# Patient Record
Sex: Male | Born: 1950
Health system: Southern US, Community
[De-identification: ages and names within clinical notes are randomized; demographics above are authoritative.]

## PROBLEM LIST (undated history)

## (undated) DIAGNOSIS — R112 Nausea with vomiting, unspecified: Secondary | ICD-10-CM

## (undated) DIAGNOSIS — I10 Essential (primary) hypertension: Secondary | ICD-10-CM

## (undated) DIAGNOSIS — E785 Hyperlipidemia, unspecified: Secondary | ICD-10-CM

## (undated) DIAGNOSIS — R52 Pain, unspecified: Secondary | ICD-10-CM

## (undated) DIAGNOSIS — M1990 Unspecified osteoarthritis, unspecified site: Secondary | ICD-10-CM

## (undated) DIAGNOSIS — C801 Malignant (primary) neoplasm, unspecified: Secondary | ICD-10-CM

## (undated) DIAGNOSIS — F419 Anxiety disorder, unspecified: Secondary | ICD-10-CM

## (undated) DIAGNOSIS — K219 Gastro-esophageal reflux disease without esophagitis: Secondary | ICD-10-CM

## (undated) DIAGNOSIS — K5792 Diverticulitis of intestine, part unspecified, without perforation or abscess without bleeding: Secondary | ICD-10-CM

## (undated) DIAGNOSIS — F988 Other specified behavioral and emotional disorders with onset usually occurring in childhood and adolescence: Secondary | ICD-10-CM

## (undated) DIAGNOSIS — IMO0001 Reserved for inherently not codable concepts without codable children: Secondary | ICD-10-CM

## (undated) DIAGNOSIS — E119 Type 2 diabetes mellitus without complications: Secondary | ICD-10-CM

## (undated) DIAGNOSIS — Z9889 Other specified postprocedural states: Secondary | ICD-10-CM

## (undated) HISTORY — PX: OTHER SURGICAL HISTORY: SHX169

## (undated) HISTORY — DX: Unspecified osteoarthritis, unspecified site: M19.90

## (undated) HISTORY — DX: Anxiety disorder, unspecified: F41.9

## (undated) HISTORY — DX: Diverticulitis of intestine, part unspecified, without perforation or abscess without bleeding: K57.92

## (undated) HISTORY — DX: Hyperlipidemia, unspecified: E78.5

## (undated) HISTORY — PX: BACK SURGERY: SHX140

## (undated) HISTORY — PX: HERNIA REPAIR: SHX51

---

## 1997-08-07 ENCOUNTER — Ambulatory Visit (HOSPITAL_COMMUNITY): Admission: RE | Admit: 1997-08-07 | Discharge: 1997-08-07 | Payer: Self-pay | Admitting: *Deleted

## 2000-11-19 ENCOUNTER — Emergency Department (HOSPITAL_COMMUNITY): Admission: EM | Admit: 2000-11-19 | Discharge: 2000-11-19 | Payer: Self-pay | Admitting: Emergency Medicine

## 2000-11-19 ENCOUNTER — Encounter: Payer: Self-pay | Admitting: Emergency Medicine

## 2001-03-01 ENCOUNTER — Encounter: Admission: RE | Admit: 2001-03-01 | Discharge: 2001-03-30 | Payer: Self-pay | Admitting: Internal Medicine

## 2003-11-12 ENCOUNTER — Ambulatory Visit (HOSPITAL_COMMUNITY): Admission: RE | Admit: 2003-11-12 | Discharge: 2003-11-12 | Payer: Self-pay | Admitting: *Deleted

## 2005-10-12 ENCOUNTER — Encounter: Admission: RE | Admit: 2005-10-12 | Discharge: 2005-10-12 | Payer: Self-pay | Admitting: *Deleted

## 2005-11-18 ENCOUNTER — Encounter: Admission: RE | Admit: 2005-11-18 | Discharge: 2005-11-18 | Payer: Self-pay | Admitting: General Surgery

## 2005-12-22 ENCOUNTER — Inpatient Hospital Stay (HOSPITAL_COMMUNITY): Admission: RE | Admit: 2005-12-22 | Discharge: 2005-12-26 | Payer: Self-pay | Admitting: General Surgery

## 2005-12-22 ENCOUNTER — Encounter (INDEPENDENT_AMBULATORY_CARE_PROVIDER_SITE_OTHER): Payer: Self-pay | Admitting: Specialist

## 2006-01-10 HISTORY — PX: COLON SURGERY: SHX602

## 2007-10-17 ENCOUNTER — Encounter: Admission: RE | Admit: 2007-10-17 | Discharge: 2007-10-17 | Payer: Self-pay | Admitting: *Deleted

## 2007-12-19 ENCOUNTER — Ambulatory Visit (HOSPITAL_COMMUNITY): Admission: RE | Admit: 2007-12-19 | Discharge: 2007-12-20 | Payer: Self-pay | Admitting: Specialist

## 2007-12-19 ENCOUNTER — Encounter (INDEPENDENT_AMBULATORY_CARE_PROVIDER_SITE_OTHER): Payer: Self-pay | Admitting: Specialist

## 2008-02-29 ENCOUNTER — Ambulatory Visit: Payer: Self-pay | Admitting: Diagnostic Radiology

## 2008-02-29 ENCOUNTER — Emergency Department (HOSPITAL_BASED_OUTPATIENT_CLINIC_OR_DEPARTMENT_OTHER): Admission: EM | Admit: 2008-02-29 | Discharge: 2008-02-29 | Payer: Self-pay | Admitting: Emergency Medicine

## 2010-04-27 LAB — BASIC METABOLIC PANEL
BUN: 17 mg/dL (ref 6–23)
CO2: 27 mEq/L (ref 19–32)
Chloride: 106 mEq/L (ref 96–112)
GFR calc Af Amer: >60 mL/min (ref >60)
GFR calc non Af Amer: >60 mL/min (ref >60)
Potassium: 4.2 mEq/L (ref 3.5–5.1)

## 2010-04-27 LAB — D-DIMER, QUANTITATIVE: D-Dimer, Quant: 0.44 ug/mL-FEU (ref 0.00–0.48)

## 2010-04-27 LAB — POCT CARDIAC MARKERS
CKMB, poc: 2.9 ng/mL (ref 1.0–8.0)
Myoglobin, poc: 399 ng/mL (ref 12–200)
Troponin i, poc: <0.05 ng/mL (ref 0.00–0.09)
Troponin i, poc: <0.05 ng/mL (ref 0.00–0.09)

## 2010-05-25 NOTE — Op Note (Signed)
NAMESHANTI, EICHEL              ACCOUNT NO.:  [id number]   MEDICAL RECORD NO.:  [id number]          PATIENT TYPE:  AMB   LOCATION:  DAY                          FACILITY:  [facility]   PHYSICIAN:  Jene Every, M.D.    DATE OF BIRTH:  August 25, 1950   DATE OF PROCEDURE:  12/19/2007  DATE OF DISCHARGE:                               OPERATIVE REPORT   PREOPERATIVE DIAGNOSES:  Disk herniation, spinal stenosis at L4-5,  right; L4-5, left.   PROCEDURES PERFORMED:  1. Bilateral redo hemilaminotomy, lateral recess decompression,      microdiskectomy L4-5.  2. Bilateral hemilaminotomy, lateral recess decompression,      foraminotomy L5-S1, redo.   ANESTHESIA:  General.   ASSISTANT:  Roma Schanz, P.A.   BRIEF HISTORY/INDICATIONS:  The patient is a 60 year old male who has  had two surgical decompressions of L4-5,  and L5-S1 in the past.  He has  had a recurrent disk herniation on MRI with right and left-sided  symptoms, fragments noted on the right and on the left.  Free fragment  on the left disk space, on the right lateral recess stenosis.  We have  had apparent partial removal of the lamina of 4-5 and by MRI at least  decompression of 4-5 on the right bilaterally.  Indicated decompression  bilaterally of predominantly at 4-5 and possibly down at 5-1.  The  risks and benefits discussed including bleeding, infection, damage to  neurovascular structures, CSF leakage, epidural fibrosis, adjacent  segment disease and the need for fusion in future, anesthetic  complications, etc.   The operative case was extended in length due to the extensive fibrosis  noted and the presence of the bilateral decompression with epidural  fibrosis at two levels and an old free fragment that was encased in scar  tissue.   TECHNIQUE:  The patient in supine position, after administration of  adequate general anesthesia, gram of vanco and Kefzol, the lumbar region  was prepped and draped in the usual  sterile fashion.  An 18 gauge spinal  needle was utilized to localize the 4-5 process.  Incision was made from  spinous process of 3 down to below the 4-5 space.  Subcutaneous tissue  was dissected.  Electrocautery utilized to achieve hemostasis.  Dorsolumbar fascia identified and divided along the skin incision.  The  spinous process of 3 was identified.  Kocher placed and confirmed, it  was skeletonized at three.  Lamina of three bilaterally was identified  as was 4.  We then with a curette skeletonized the lateral borders of  the previous decompression at 4-5.  There was epidural fibrosis scar  tissue bilaterally.  We then found the residual lamina of 4 and of 5.  The operating microscope draped and brought onto the surgical field.  I  turned my attention first to the facet caudad edge of the residual  neural arch of 4 and used a straight curette and 2 mm Kerrison to  decompress above the ligamentum flavum fatty tissue.  And on the right  we decompressed the lateral border to the medial border of the pedicle.  There was a large bulbous free fragment that appeared to be old, was  sitting in the axilla of the nerve root.  The nerve was compressed  significantly in the lateral recess and into the foramen.  It took  extensive meticulous dissection to delineate the borders of the fragment  as it was encased in scar tissue from the thecal sac and the nerve root  of 5.  We decompressed the lateral border with a 2 mm Kerrison.  There  was significant stenosis noted there as well.  This was skeletonized  into the axilla and once it was determined that it was clearly a free  fragment.  We incised it and removed multiple free fragments from this  encasing and finally the large full free fragment that extended down to  the disk space across the midline with a micro pituitary removed the  fragment was central noted on the MRI.  There was a large cavity with  scar tissue encased the free fragment  suggesting that had been there for  a period of time.  Following this decompression the foraminotomy of 4  and 5 removing the residual lamina across the midline of 5 to expose the  foramen of 5 down at 5-1.  It was found that the 5 root and 4 root were  freely mobile and had at least a centimeter excursion medial to the  pedicle.  No disk herniation was noted at the disk space.  On the  contralateral side again we skeletonized the 4-5 previous decompression,  the residual lamina of 5, removed the residual lamina of 5,  decompressing the lateral recess, superior lateral recess stenosis  multifactorial on the right at the disk space, ligamentum flavum  hypertrophy and stenosis.  This was decompressed to the medial border of  the pedicle.  Both sides we preserved ample portion of the pars at L4.  Undercutting the lamina, superior articular of 5 was noted to be part of  this process.  This was skeletonized and removed with 2 mm Kerrison.  A  disk herniation was noted here that was obtained within the annulus.  A  copious portion of disk material was removed from the disk space with  micro pituitary, decompressing this lateral recess.  There was a small  bulbous area around thecal sac.  It was presumed old scar tissue and  disk fragment adhesed to the thecal sac not impinging but felt best to  leave as the risk of dural rent was high.  The disk posed no impingement  upon the root it was felt to be the best course of action at this point.  We placed a full foraminotomy of 5 extending the hockey-stick down into  the 5-1 space onto the lateral recess.  Good excursion of 5 in the 4  root at least a centimeter medial to the pedicle.  There was  hypervascularity and epidural venous plexus bilaterally that were  cauterized with bipolar electrocautery.  The disk space was copiously  irrigated bilaterally.  Hockey stick probe was then placed cephalad to  the pedicle of 3 and caudad to the foramen of 5  bilaterally.  There was  excellent decompression noted, no CSF leakage or active bleeding.  We  felt the thecal sac was pulsatile not constricted.  We debulked some the  epidural fibrosis on the dorsal aspect of the sac.   The disk space was copiously irrigated.  Thrombin-soaked Gelfoam was  placed in the laminotomy defect.  McCullough retractors  removed and  irrigated.  Paraspinous muscle inspected with no active bleeding.  The  muscle and fascia were reapproximated with 1 Vicryl interrupted figure-  of-eight sutures.  Subcutaneous tissue reapproximated with 2-0 Vicryl  sutures.  The skin was reapproximated with staples and wound was dressed  sterilely, placed supine on hospital bed, extubated without difficulty  and transported to recovery room in satisfactory condition.   The patient tolerated the procedure well.  Blood loss 100 mL.      Jene Every, M.D.  Electronically Signed     JB/MEDQ  D:  12/19/2007  T:  12/19/2007  Job:  [id number]

## 2010-05-28 NOTE — Op Note (Signed)
[name], [name]              ACCOUNT NO.:  [id number]   MEDICAL RECORD NO.:  [id number]          PATIENT TYPE:  AMB   LOCATION:  ENDO                         FACILITY:  [facility]   PHYSICIAN:  Georgiana Spinner, M.D.    DATE OF BIRTH:  [birth year]   DATE OF PROCEDURE:  11/12/2003  DATE OF DISCHARGE:                                 OPERATIVE REPORT   PROCEDURE:  Colonoscopy.   INDICATIONS:  Rectal bleeding, colon cancer screening.   ANESTHESIA:  Demerol 80 mg, Versed 8 mg.   DESCRIPTION OF PROCEDURE:  With patient mildly sedated in the left lateral  decubitus position, a rectal examination was performed which was  unremarkable.  Subsequently, the Olympus videoscopic colonoscope was  inserted in the rectum, passed through a diverticula-filled sigmoid colon to  the cecum which was identified by ileocecal valve and appendiceal orifice,  both of which were photographed.  From this point, the colonoscope was  slowly withdrawn, taking circumferential views of the colonic mucosa,  stopping in the rectum which appeared normal on direct and retroflexed view.  The endoscope was straightened, withdrawn.  The patient's vital signs and  pulse oximeter remained stable.  The patient tolerated the procedure well  without apparent complications.   FINDINGS:  1.  Diverticulosis of the sigmoid colon.  2.  Otherwise, unremarkable examination.   PLAN:  Repeat examination in 5-10 years.      GMO/MEDQ  D:  11/12/2003  T:  11/12/2003  Job:  [id number]

## 2010-05-28 NOTE — H&P (Signed)
[name], Jones              ACCOUNT NO.:  [id number]   MEDICAL RECORD NO.:  [id number]          PATIENT TYPE:  INP   LOCATION:  1621                         FACILITY:  [facility]   PHYSICIAN:  Adolph Pollack, M.D.DATE OF BIRTH:  [birth year]   DATE OF ADMISSION:  12/22/2005  DATE OF DISCHARGE:                              HISTORY & PHYSICAL   REASON FOR ADMISSION:  Elective sigmoid colectomy.   HISTORY OF PRESENT ILLNESS:  [name] is a 60 year old male who has  had recurrent sigmoid diverticular disease with a recurrent  diverticulitis.  It is for this reason, that he presents now for  elective sigmoid colectomy.  Barium enema demonstrates except extensive  sigmoid diverticular disease.   PAST MEDICAL HISTORY:  1. Hypertension.  2. Hearing loss right ear.  3. Chronic lower back pain.  4. Adult attention deficit disorder.   PREVIOUS OPERATIONS:  1. Lumbar spine surgery twice.  2. Left inguinal hernia repair.  3. LASIK corrective surgery for eyes.   ALLERGIES:  None.   HOME MEDICATIONS:  Adderall, Diovan, aspirin, vitamin B12, multivitamin,  oxycodone p.r.n., Flexeril p.r.n., and fish oil.   SOCIAL HISTORY:  Married.  No tobacco use; is a social drinker.   FAMILY HISTORY:  Notable for heart disease, hypertension, and  hypercholesterolemia.   PHYSICAL EXAM:  GENERAL:  A well-developed, well-nourished male in no  acute distress, pleasant and cooperative.  VITAL SIGNS: Temperature 97.1, blood pressure 142/96, pulse 72.  EYES:  Extraocular motions intact.  No icterus.  RESPIRATORY:  Breath sounds equal and clear.  Respirations are  unlabored.  CARDIOVASCULAR:  Heart demonstrates a regular rate, regular rhythm.  I  do not hear a murmur.  ABDOMEN:  Soft, nontender, nondistended.  Well-healed left groin scar is  present.  No palpable masses or organomegaly.  MUSCULOSKELETAL:  Extremities have SCDs on.   IMPRESSION:  Recurrent sigmoid diverticulitis.   PLAN:   Laparoscopic-assisted sigmoid colectomy.  We discussed this  procedure and the risks preoperatively.      Adolph Pollack, M.D.  Electronically Signed     TJR/MEDQ  D:  12/22/2005  T:  12/22/2005  Job:  [id number]

## 2010-05-28 NOTE — Discharge Summary (Signed)
NAMEAMR, STURTEVANT              ACCOUNT NO.:  [id number]   MEDICAL RECORD NO.:  [id number]          PATIENT TYPE:  INP   LOCATION:  1621                         FACILITY:  [facility]   PHYSICIAN:  Adolph Pollack, M.D.DATE OF BIRTH:  Apr 24, 1950   DATE OF ADMISSION:  12/22/2005  DATE OF DISCHARGE:  12/26/2005                               DISCHARGE SUMMARY   PRINCIPLE DISCHARGE DIAGNOSIS:  Recurrent sigmoid diverticulitis.   SECONDARY DIAGNOSES:  1. Mild postoperative ileus.  2. Hypertension.   PROCEDURE:  Laparoscopic-assisted sigmoid colectomy.   INDICATIONS:  This is a 60 year old male who has had recurrent sigmoid  diverticular disease with diverticulitis.  He was admitted for elective  resection.   HOSPITAL COURSE:  He underwent the laparoscopic-assisted sigmoid  colectomy.  Tolerated this well.  Postoperatively, he had a mild ileus  but had resumption of bowel function.  Thus, by his third postop day, he  had a small bowel movement.  By the fourth postop day, he was tolerating  a diet, passing gas, having BMs, and ready to be discharged.   DISPOSITION:  Discharged home in satisfactory condition on December 26, 2005.  His final pathology demonstrates diverticulosis with no evidence  of malignancy.  He was given activity instructions and will return to  the office in a few days to have his staples removed.      Adolph Pollack, M.D.  Electronically Signed     TJR/MEDQ  D:  01/20/2006  T:  01/20/2006  Job:  [id number]   cc:   Georgiana Spinner, M.D.  Fax: [phone]   Doris Cheadle. Foy Guadalajara, M.D.  Fax: [phone]

## 2010-05-28 NOTE — Op Note (Signed)
[name], [name]              ACCOUNT NO.:  [id number]   MEDICAL RECORD NO.:  [id number]          PATIENT TYPE:  INP   LOCATION:  1621                         FACILITY:  [facility]   PHYSICIAN:  Adolph Pollack, M.D.DATE OF BIRTH:  [birth year]   DATE OF PROCEDURE:  12/22/2005  DATE OF DISCHARGE:                               OPERATIVE REPORT   PREOP DIAGNOSIS:  Recurrent sigmoid diverticulitis.   POSTOP DIAGNOSIS:  Recurrent sigmoid diverticulitis.   PROCEDURE:  Laparoscopic-assisted sigmoid colectomy with mobilization of  splenic flexure.   SURGEON:  Adolph Pollack, M.D.   ASSISTANT:  Ardeth Sportsman, MD   ANESTHESIA:  General.   INDICATIONS:  This 60 year old male has had recurring bouts of sigmoid  diverticulitis.  He has significant diverticular disease on barium enema  and now presents for the above procedure.   TECHNIQUE:  He was seen in the holding area and brought to the operating  room, placed supine on the operating table, and general anesthetic was  administered.  The hair on the abdominal wall was clipped.  A Foley  catheter was inserted.  He was placed in the lithotomy position.  The  abdominal wall and perineal area were then sterilely prepped and draped.  A small supraumbilical incision was made through the skin, subcutaneous  tissue, fascia and peritoneum.  A pursestring suture of #0 Vicryl was  placed around the fascial edges.  A Hassan trocar was introduced into  the peritoneal cavity; and a pneumoperitoneum was created by  insufflation of CO2 gas.   Following this, the laparoscope was introduced.  A 5-mm trocar was  placed in the right lower quadrant, one in the suprapubic region, and  one in the left upper quadrant.  There were significant adhesions  between the omentum and sigmoid colon in the anterolateral abdominal  wall.  I mobilized these using the harmonic scalpel.  The colon was very  thick and abnormal-appearing in this area consistent with  recurrent  inflammatory disease.  I then mobilized the descending colon by dividing  the white line of Toldt; and the splenic flexure was mobilized utilizing  the harmonic scalpel allowing the transverse colon to drop distally.  I  then mobilized down to the rectosigmoid junction, although the adhesions  there were very dense.  I identified the left ureter and kept my plane  of dissection anterior to this.  At this point I had adequate  mobilization of the descending colon and sigmoid colon; and there was  some significant adhesions in the pelvis.  I decided to go ahead and  make my extraction site; and do further mobilization and extract the  colon.   I removed the supraumbilical 5 mm trocar and made a longitudinal limited  lower midline incision dividing the skin, and subcutaneous tissue,  fascia, and peritoneum.  Wound protection device was placed.  I grasped  the sigmoid colon; and was able to free up more adhesions and mobilize  the rectosigmoid junction.  The colon, at this segment, was quite  diseased with multiple diverticula and very firm; and looked to be  chronically inflamed.  I found the sigmoid colon and descending colon  junction which appeared to be soft and relatively normal.  Using the GIA  stapler.  I divided the colon here.  I then divided the mesentery using  a LigaSure and suture ligatures, down to the rectosigmoid junction.  I  divided the colon sharply here.  I handed  the specimen off the field to  be sent to pathology.  There was a small, little leakage of stool which  was contained.   Following this,  I performed an end-to-side anastomosis in a Baker-type  fashion using a single layer of interrupted 3-0 silk sutures.  I  inverted the suture line.  The anastomosis was patent, viable, and under  no tension.  Following this gloves were changed; and the abdominal  cavity irrigated and fluid evacuated.  I saw no evidence of bleeding.  I  then applied some Tisseel  to the anastomosis.  I then occluded the  descending colon proximal to the anastomosis.  Air was injected into the  rectum; and there was a small amount of fluid; and the anastomosis was  placed under this; and there was no evidence of leak.  The air was  released.   After this, needle, sponge and instrument counts were reported to be  correct.  I then closed the lower midline fascia with a running #1 PDS  suture.  I reinsufflated the abdomen and examined it; and evacuated some  irrigation fluid; then found a strong fascial closure and no active  bleeding.  I subsequently removed all trocars and released the  pneumoperitoneum.  The supraumbilical fascial defect was closed by  tightening up and tying down the pursestring suture.   Following this, the subcutaneous tissue was then irrigated.  The skin  closed with staples.  Sterile dressings were applied.   He tolerated the procedure well without apparent complications; and he  was taken to recovery in satisfactory condition.      Adolph Pollack, M.D.  Electronically Signed     TJR/MEDQ  D:  12/22/2005  T:  12/22/2005  Job:  [id number]   cc:   Molly Maduro L. Foy Guadalajara, M.D.  Fax: [phone]

## 2010-07-07 ENCOUNTER — Other Ambulatory Visit: Payer: Self-pay | Admitting: Dermatology

## 2010-10-08 ENCOUNTER — Other Ambulatory Visit: Payer: Self-pay | Admitting: Dermatology

## 2010-10-15 LAB — CBC
HCT: 48.4 % (ref 39.0–52.0)
Hemoglobin: 16.1 g/dL (ref 13.0–17.0)
RDW: 13.4 % (ref 11.5–15.5)

## 2010-10-15 LAB — COMPREHENSIVE METABOLIC PANEL
AST: 25 U/L (ref 0–37)
Albumin: 3.8 g/dL (ref 3.5–5.2)
Creatinine, Ser: 1.09 mg/dL (ref 0.4–1.5)

## 2010-10-15 LAB — URINALYSIS, ROUTINE W REFLEX MICROSCOPIC
Bilirubin Urine: NEGATIVE
Nitrite: NEGATIVE
Protein, ur: NEGATIVE mg/dL
Specific Gravity, Urine: 1.018 (ref 1.005–1.030)
pH: 5.5 (ref 5.0–8.0)

## 2011-05-09 ENCOUNTER — Encounter (HOSPITAL_COMMUNITY): Payer: Self-pay | Admitting: Pharmacy Technician

## 2011-05-10 ENCOUNTER — Encounter (HOSPITAL_COMMUNITY): Payer: Self-pay

## 2011-05-10 ENCOUNTER — Encounter (HOSPITAL_COMMUNITY)
Admission: RE | Admit: 2011-05-10 | Discharge: 2011-05-10 | Disposition: A | Discharge status: Home / Self Care | Payer: Worker's Compensation | Source: Ambulatory Visit | Attending: Specialist | Admitting: Specialist

## 2011-05-10 ENCOUNTER — Other Ambulatory Visit: Payer: Self-pay | Admitting: Otolaryngology

## 2011-05-10 ENCOUNTER — Other Ambulatory Visit: Payer: Self-pay

## 2011-05-10 ENCOUNTER — Ambulatory Visit (HOSPITAL_COMMUNITY)
Admission: RE | Admit: 2011-05-10 | Discharge: 2011-05-10 | Disposition: A | Discharge status: Home / Self Care | Payer: Worker's Compensation | Source: Ambulatory Visit | Attending: Specialist | Admitting: Specialist

## 2011-05-10 DIAGNOSIS — Z01810 Encounter for preprocedural cardiovascular examination: Secondary | ICD-10-CM | POA: Insufficient documentation

## 2011-05-10 DIAGNOSIS — Z01812 Encounter for preprocedural laboratory examination: Secondary | ICD-10-CM | POA: Insufficient documentation

## 2011-05-10 DIAGNOSIS — M24119 Other articular cartilage disorders, unspecified shoulder: Secondary | ICD-10-CM | POA: Insufficient documentation

## 2011-05-10 DIAGNOSIS — M25819 Other specified joint disorders, unspecified shoulder: Secondary | ICD-10-CM | POA: Insufficient documentation

## 2011-05-10 DIAGNOSIS — I10 Essential (primary) hypertension: Secondary | ICD-10-CM | POA: Insufficient documentation

## 2011-05-10 HISTORY — DX: Other specified behavioral and emotional disorders with onset usually occurring in childhood and adolescence: F98.8

## 2011-05-10 HISTORY — DX: Essential (primary) hypertension: I10

## 2011-05-10 HISTORY — DX: Reserved for inherently not codable concepts without codable children: IMO0001

## 2011-05-10 HISTORY — DX: Gastro-esophageal reflux disease without esophagitis: K21.9

## 2011-05-10 LAB — CBC
Hemoglobin: 15.1 g/dL (ref 13.0–17.0)
MCH: 31.5 pg (ref 26.0–34.0)
MCHC: 33.3 g/dL (ref 30.0–36.0)
RDW: 12.9 % (ref 11.5–15.5)

## 2011-05-10 LAB — BASIC METABOLIC PANEL
BUN: 15 mg/dL (ref 6–23)
Calcium: 9.5 mg/dL (ref 8.4–10.5)
GFR calc Af Amer: >90 mL/min (ref >90)
GFR calc non Af Amer: 90 mL/min — ABNORMAL LOW (ref >90)
Glucose, Bld: 100 mg/dL — ABNORMAL HIGH (ref 70–99)
Potassium: 4 mEq/L (ref 3.5–5.1)
Sodium: 142 mEq/L (ref 135–145)

## 2011-05-10 LAB — SURGICAL PCR SCREEN
MRSA, PCR: NEGATIVE
Staphylococcus aureus: NEGATIVE

## 2011-05-10 MED ORDER — CHLORHEXIDINE GLUCONATE 4 % EX LIQD: 60.0000 mL | Freq: Once | CUTANEOUS | Status: DC

## 2011-05-10 NOTE — Patient Instructions (Signed)
20 [name]  05/10/2011   Your procedure is scheduled on: 05/11/11  Report to SHORT STAY DEPT  at 8:00AM.  Call this number if you have problems the morning of surgery: [phone]   Remember:   Do not eat food or drink liquids AFTER MIDNIGHT    Take these medicines the morning of surgery with A SIP OF WATER: LIPITOR / PRISTIQ / HYDROCODONE IF NEEDED   Do not wear jewelry, make-up or nail polish.  Do not wear lotions, powders, or perfumes.   Do not shave legs or underarms 48 hrs. before surgery (men may shave face)  Do not bring valuables to the hospital.  Contacts, dentures or bridgework may not be worn into surgery.  Leave suitcase in the car. After surgery it may be brought to your room.  For patients admitted to the hospital, checkout time is 11:00 AM the day of discharge.   Patients discharged the day of surgery will not be allowed to drive home.    Special Instructions:   Please read over the following fact sheets that you were given: MRSA  Information / Incentive Spirometer               SHOWER WITH BETASEPT THE NIGHT BEFORE SURGERY AND THE MORNING OF SURGERY

## 2011-05-11 ENCOUNTER — Ambulatory Visit (HOSPITAL_COMMUNITY): Payer: Worker's Compensation | Admitting: *Deleted

## 2011-05-11 ENCOUNTER — Encounter (HOSPITAL_COMMUNITY): Payer: Self-pay

## 2011-05-11 ENCOUNTER — Encounter (HOSPITAL_COMMUNITY): Payer: Self-pay | Admitting: *Deleted

## 2011-05-11 ENCOUNTER — Ambulatory Visit (HOSPITAL_COMMUNITY)
Admission: RE | Admit: 2011-05-11 | Discharge: 2011-05-11 | Disposition: A | Discharge status: Home / Self Care | Payer: Worker's Compensation | Source: Ambulatory Visit | Attending: Specialist | Admitting: Specialist

## 2011-05-11 ENCOUNTER — Encounter (HOSPITAL_COMMUNITY)
Admission: RE | Disposition: A | Discharge status: Home / Self Care | Payer: Self-pay | Source: Ambulatory Visit | Attending: Specialist

## 2011-05-11 DIAGNOSIS — M25819 Other specified joint disorders, unspecified shoulder: Secondary | ICD-10-CM | POA: Insufficient documentation

## 2011-05-11 DIAGNOSIS — K219 Gastro-esophageal reflux disease without esophagitis: Secondary | ICD-10-CM | POA: Insufficient documentation

## 2011-05-11 DIAGNOSIS — Z79899 Other long term (current) drug therapy: Secondary | ICD-10-CM | POA: Insufficient documentation

## 2011-05-11 DIAGNOSIS — I10 Essential (primary) hypertension: Secondary | ICD-10-CM | POA: Insufficient documentation

## 2011-05-11 DIAGNOSIS — M24119 Other articular cartilage disorders, unspecified shoulder: Secondary | ICD-10-CM | POA: Insufficient documentation

## 2011-05-11 DIAGNOSIS — M7541 Impingement syndrome of right shoulder: Secondary | ICD-10-CM

## 2011-05-11 DIAGNOSIS — M9420 Chondromalacia, unspecified site: Secondary | ICD-10-CM | POA: Insufficient documentation

## 2011-05-11 SURGERY — SHOULDER ARTHROSCOPY WITH SUBACROMIAL DECOMPRESSION
Anesthesia: General | Site: Shoulder | Laterality: Right | Wound class: Clean

## 2011-05-11 MED ORDER — PROMETHAZINE HCL 25 MG/ML IJ SOLN: 6.2500 mg | INTRAMUSCULAR | Status: DC | PRN

## 2011-05-11 MED ORDER — ONDANSETRON HCL 4 MG/2ML IJ SOLN
INTRAMUSCULAR | Status: DC | PRN
  Administered 2011-05-11 (×2): 2 mg via INTRAVENOUS

## 2011-05-11 MED ORDER — METHOCARBAMOL 500 MG PO TABS: 500.0000 mg | ORAL_TABLET | Freq: Four times a day (QID) | ORAL | Status: AC

## 2011-05-11 MED ORDER — GLYCOPYRROLATE 0.2 MG/ML IJ SOLN
INTRAMUSCULAR | Status: DC | PRN
  Administered 2011-05-11: .4 mg via INTRAVENOUS

## 2011-05-11 MED ORDER — BUPIVACAINE-EPINEPHRINE 0.5% -1:200000 IJ SOLN
INTRAMUSCULAR | Status: AC
  Filled 2011-05-11: qty 1

## 2011-05-11 MED ORDER — LACTATED RINGERS IR SOLN
Status: DC | PRN
  Administered 2011-05-11: 3000 mL

## 2011-05-11 MED ORDER — EPINEPHRINE HCL 1 MG/ML IJ SOLN
INTRAMUSCULAR | Status: AC
  Filled 2011-05-11: qty 2

## 2011-05-11 MED ORDER — EPINEPHRINE HCL 1 MG/ML IJ SOLN
INTRAMUSCULAR | Status: DC | PRN
  Administered 2011-05-11: 1 mg

## 2011-05-11 MED ORDER — BUPIVACAINE-EPINEPHRINE 0.5% -1:200000 IJ SOLN
INTRAMUSCULAR | Status: DC | PRN
  Administered 2011-05-11: 7 mL

## 2011-05-11 MED ORDER — MIDAZOLAM HCL 5 MG/5ML IJ SOLN
INTRAMUSCULAR | Status: DC | PRN
  Administered 2011-05-11: 2 mg via INTRAVENOUS

## 2011-05-11 MED ORDER — NEOSTIGMINE METHYLSULFATE 1 MG/ML IJ SOLN
INTRAMUSCULAR | Status: DC | PRN
  Administered 2011-05-11: 3 mg via INTRAVENOUS

## 2011-05-11 MED ORDER — FENTANYL CITRATE 0.05 MG/ML IJ SOLN
25.0000 ug | INTRAMUSCULAR | Status: DC | PRN
Start: 2011-05-11 — End: 2011-05-11

## 2011-05-11 MED ORDER — SUCCINYLCHOLINE CHLORIDE 20 MG/ML IJ SOLN
INTRAMUSCULAR | Status: DC | PRN
  Administered 2011-05-11: 100 mg via INTRAVENOUS

## 2011-05-11 MED ORDER — PROPOFOL 10 MG/ML IV EMUL
INTRAVENOUS | Status: DC | PRN
  Administered 2011-05-11: 200 mg via INTRAVENOUS

## 2011-05-11 MED ORDER — FENTANYL CITRATE 0.05 MG/ML IJ SOLN
INTRAMUSCULAR | Status: DC | PRN
  Administered 2011-05-11 (×5): 50 ug via INTRAVENOUS

## 2011-05-11 MED ORDER — LIDOCAINE HCL (CARDIAC) 20 MG/ML IV SOLN
INTRAVENOUS | Status: DC | PRN
  Administered 2011-05-11: 20 mg via INTRAVENOUS

## 2011-05-11 MED ORDER — ACETAMINOPHEN 10 MG/ML IV SOLN
INTRAVENOUS | Status: DC | PRN
  Administered 2011-05-11: 1000 mg via INTRAVENOUS

## 2011-05-11 MED ORDER — CISATRACURIUM BESYLATE 2 MG/ML IV SOLN
INTRAVENOUS | Status: DC | PRN
  Administered 2011-05-11: 10 mg via INTRAVENOUS

## 2011-05-11 MED ORDER — DROPERIDOL 2.5 MG/ML IJ SOLN
INTRAMUSCULAR | Status: DC | PRN
  Administered 2011-05-11: .5 mg via INTRAVENOUS

## 2011-05-11 MED ORDER — CEFAZOLIN SODIUM 1-5 GM-% IV SOLN
1.0000 g | INTRAVENOUS | Status: AC
  Administered 2011-05-11: 1 g via INTRAVENOUS

## 2011-05-11 MED ORDER — LACTATED RINGERS IV SOLN
INTRAVENOUS | Status: DC | PRN
  Administered 2011-05-11 (×2): via INTRAVENOUS

## 2011-05-11 MED ORDER — CEFAZOLIN SODIUM 1-5 GM-% IV SOLN
INTRAVENOUS | Status: AC
  Filled 2011-05-11: qty 50

## 2011-05-11 MED ORDER — KETAMINE HCL 10 MG/ML IJ SOLN
INTRAMUSCULAR | Status: DC | PRN
  Administered 2011-05-11: 10 mg via INTRAVENOUS

## 2011-05-11 MED ORDER — LACTATED RINGERS IV SOLN
INTRAVENOUS | Status: DC
  Administered 2011-05-11: 1000 mL via INTRAVENOUS

## 2011-05-11 MED ORDER — LACTATED RINGERS IV SOLN: INTRAVENOUS | Status: DC

## 2011-05-11 MED ORDER — ACETAMINOPHEN 10 MG/ML IV SOLN
INTRAVENOUS | Status: AC
  Filled 2011-05-11: qty 100

## 2011-05-11 MED ORDER — HYDROCODONE-ACETAMINOPHEN 7.5-325 MG PO TABS: 2.0000 | ORAL_TABLET | ORAL | Status: AC | PRN

## 2011-05-11 MED FILL — Cisatracurium Besylate (PF) IV Soln 10 MG/5ML (2 MG/ML): INTRAVENOUS | Qty: 5 | Status: AC

## 2011-05-11 SURGICAL SUPPLY — 33 items
BLADE 4.2CUDA (BLADE) ×2 IMPLANT
BLADE SURG SZ11 CARB STEEL (BLADE) ×2 IMPLANT
BOOTIES KNEE HIGH SLOAN (MISCELLANEOUS) ×4 IMPLANT
BUR OVAL 4.0 (BURR) ×2 IMPLANT
CLOTH BEACON ORANGE TIMEOUT ST (SAFETY) ×2 IMPLANT
DRAPE SHOULDER BEACH CHAIR (DRAPES) ×2 IMPLANT
DRAPE U-SHAPE 47X51 STRL (DRAPES) ×2 IMPLANT
DRSG ADAPTIC 3X8 NADH LF (GAUZE/BANDAGES/DRESSINGS) ×2 IMPLANT
DURAPREP 26ML APPLICATOR (WOUND CARE) ×2 IMPLANT
GLOVE BIO SURGEON STRL SZ7.5 (GLOVE) ×2 IMPLANT
GLOVE BIO SURGEON STRL SZ8 (GLOVE) ×4 IMPLANT
GLOVE BIOGEL M 8.0 STRL (GLOVE) ×2 IMPLANT
GLOVE BIOGEL PI IND STRL 8 (GLOVE) ×1 IMPLANT
GLOVE BIOGEL PI INDICATOR 8 (GLOVE) ×1
GLOVE ECLIPSE 8.0 STRL XLNG CF (GLOVE) ×2 IMPLANT
GOWN PREVENTION PLUS LG XLONG (DISPOSABLE) ×2 IMPLANT
GOWN STRL REIN XL XLG (GOWN DISPOSABLE) ×6 IMPLANT
KIT POSITION SHOULDER SCHLEI (MISCELLANEOUS) ×2 IMPLANT
MANIFOLD NEPTUNE II (INSTRUMENTS) ×2 IMPLANT
NEEDLE SPNL 18GX3.5 QUINCKE PK (NEEDLE) ×2 IMPLANT
PACK SHOULDER CUSTOM OPM052 (CUSTOM PROCEDURE TRAY) ×2 IMPLANT
PAD ABD 7.5X8 STRL (GAUZE/BANDAGES/DRESSINGS) ×2 IMPLANT
POSITIONER SURGICAL ARM (MISCELLANEOUS) ×2 IMPLANT
SET ARTHROSCOPY TUBING (MISCELLANEOUS) ×1
SET ARTHROSCOPY TUBING LN (MISCELLANEOUS) ×1 IMPLANT
SLING ARM IMMOBILIZER LRG (SOFTGOODS) IMPLANT
SLING ARM IMMOBILIZER MED (SOFTGOODS) ×2 IMPLANT
SPONGE GAUZE 4X4 12PLY (GAUZE/BANDAGES/DRESSINGS) ×2 IMPLANT
STRAP CHIN BCCS-OSI (MISCELLANEOUS) ×2 IMPLANT
SUT ETHILON 4 0 PS 2 18 (SUTURE) ×2 IMPLANT
TAPE CLOTH SURG 4X10 WHT LF (GAUZE/BANDAGES/DRESSINGS) ×2 IMPLANT
TUBING CONNECTING 10 (TUBING) ×2 IMPLANT
WAND 90 DEG TURBOVAC W/CORD (SURGICAL WAND) ×2 IMPLANT

## 2011-05-11 NOTE — H&P (Signed)
[name] is an 61 y.o. male.   Chief Complaint: right shoulder pain HPI: impingement syndrome right shoulder refractory.  Past Medical History  Diagnosis Date  . Hypertension   . Reflux     MILD - NO DAILY MEDS - ANTIACID IF NEEDED  . ADD (attention deficit disorder)     Past Surgical History  Procedure Date  . Back surgery     X3  . Colon surgery     COLON RESECTION FOR DIVERTICULITIS  . Hernia repair     ING HERNIA REPAIR    History reviewed. No pertinent family history. Social History:  reports that he has never smoked. He does not have any smokeless tobacco history on file. He reports that he drinks alcohol. He reports that he does not use illicit drugs.  Allergies: No Known Allergies  Medications Prior to Admission  Medication Sig Dispense Refill  . atorvastatin (LIPITOR) 20 MG tablet Take 20 mg by mouth daily with breakfast.      . desvenlafaxine (PRISTIQ) 100 MG 24 hr tablet Take 100 mg by mouth daily with breakfast.      . fish oil-omega-3 fatty acids 1000 MG capsule Take 1,400 mg by mouth daily with breakfast.      . Multiple Vitamin (MULITIVITAMIN WITH MINERALS) TABS Take 1 tablet by mouth daily with breakfast.      . valsartan (DIOVAN) 320 MG tablet Take 320 mg by mouth daily with breakfast.      . aspirin EC 81 MG tablet Take 81 mg by mouth daily with breakfast.      . HYDROcodone-acetaminophen (NORCO) 5-325 MG per tablet Take 1 tablet by mouth every 6 (six) hours as needed. For pain        Results for orders placed during the hospital encounter of 05/10/11 (from the past 48 hour(s))  CBC     Status: Normal   Collection Time   05/10/11 12:25 PM      Component Value Range Comment   WBC 8.9  4.0 - 10.5 (K/uL)    RBC 4.79  4.22 - 5.81 (MIL/uL)    Hemoglobin 15.1  13.0 - 17.0 (g/dL)    HCT 16.1  09.6 - 04.5 (%)    MCV 94.8  78.0 - 100.0 (fL)    MCH 31.5  26.0 - 34.0 (pg)    MCHC 33.3  30.0 - 36.0 (g/dL)    RDW 40.9  81.1 - 91.4 (%)    Platelets 198   150 - 400 (K/uL)   BASIC METABOLIC PANEL     Status: Abnormal   Collection Time   05/10/11 12:25 PM      Component Value Range Comment   Sodium 142  135 - 145 (mEq/L)    Potassium 4.0  3.5 - 5.1 (mEq/L)    Chloride 104  96 - 112 (mEq/L)    CO2 28  19 - 32 (mEq/L)    Glucose, Bld 100 (*) 70 - 99 (mg/dL)    BUN 15  6 - 23 (mg/dL)    Creatinine, Ser 7.82  0.50 - 1.35 (mg/dL)    Calcium 9.5  8.4 - 10.5 (mg/dL)    GFR calc non Af Amer 90 (*) >90 (mL/min)    GFR calc Af Amer >90  >90 (mL/min)   SURGICAL PCR SCREEN     Status: Normal   Collection Time   05/10/11 12:27 PM      Component Value Range Comment   MRSA, PCR NEGATIVE  NEGATIVE     Staphylococcus aureus NEGATIVE  NEGATIVE     Dg Chest 2 View  05/10/2011  *RADIOLOGY REPORT*  Clinical Data: Preop for right shoulder arthroscopy.  High blood pressure.  Nonsmoker.  CHEST - 2 VIEW  Comparison: 02/29/2008  Findings: Minimal convex left thoracic spine curvature. Midline trachea.  Normal heart size and mediastinal contours. No pleural effusion or pneumothorax.  Clear lungs.  IMPRESSION: No acute cardiopulmonary disease.  Original Report Authenticated By: Consuello Bossier, M.D.    Review of Systems  Unable to perform ROS Constitutional: Negative.   HENT: Negative.   Eyes: Negative.   Respiratory: Negative.   Cardiovascular: Negative.   Gastrointestinal: Negative.   Genitourinary: Negative.   Musculoskeletal: Negative.   Skin: Negative.   Neurological: Negative.   Endo/Heme/Allergies: Negative.   Psychiatric/Behavioral: Negative.     Blood pressure 167/94, pulse 73, temperature 98.2 F (36.8 C), resp. rate 20, SpO2 100.00%. Physical Exam  Constitutional: He appears well-developed.  HENT:  Head: Normocephalic.  Eyes: Pupils are equal, round, and reactive to light.  Neck: Normal range of motion.  Cardiovascular: Normal rate.   Respiratory: He is in respiratory distress.  GI: Soft.  Genitourinary: Penis normal.  Musculoskeletal:  He exhibits edema and tenderness.       Positive impingement right.  Neurological: He is alert.  Skin: Skin is warm.     Assessment/Plan Refractory impingement syndrome right shoulder. Labral tear. Plan arthroscopy. Risks discusse.  Felis Quillin C 05/11/2011, 8:39 AM

## 2011-05-11 NOTE — Brief Op Note (Signed)
05/11/2011  12:16 PM  PATIENT:  Octavia Heir  61 y.o. male  PRE-OPERATIVE DIAGNOSIS:  impingement syndrome on right  POST-OPERATIVE DIAGNOSIS:  impingement syndrome on right  PROCEDURE:  Procedure(s) (LRB): SHOULDER ARTHROSCOPY WITH SUBACROMIAL DECOMPRESSION (Right) labral debr.  SURGEON:  Surgeon(s) and Role:    * Javier Docker, MD - Primary  PHYSICIAN ASSISTANT:   ASSISTANTS: strader   ANESTHESIA:   general  EBL:  Total I/O In: 1000 [I.V.:1000] Out: 25 [Blood:25]  BLOOD ADMINISTERED:none  DRAINS: none   LOCAL MEDICATIONS USED:  MARCAINE     SPECIMEN:  No Specimen  DISPOSITION OF SPECIMEN:  N/A  COUNTS:  YES  TOURNIQUET:  * No tourniquets in log *  DICTATION: .Other Dictation: Dictation Number was unable to hear number from dictation on regular system  PLAN OF CARE: Discharge to home after PACU  PATIENT DISPOSITION:  PACU - hemodynamically stable.   Delay start of Pharmacological VTE agent (>24hrs) due to surgical blood loss or risk of bleeding: no

## 2011-05-11 NOTE — Anesthesia Postprocedure Evaluation (Signed)
Anesthesia Post Note  Patient: [name]  Procedure(s) Performed: Procedure(s) (LRB): SHOULDER ARTHROSCOPY WITH SUBACROMIAL DECOMPRESSION (Right)  Anesthesia type: General  Patient location: PACU  Post pain: Pain level controlled  Post assessment: Post-op Vital signs reviewed  Last Vitals:  Filed Vitals:   05/11/11 1300  BP: 129/64  Pulse: 70  Temp: 36.7 C  Resp: 15    Post vital signs: Reviewed  Level of consciousness: sedated  Complications: No apparent anesthesia complications

## 2011-05-11 NOTE — Transfer of Care (Signed)
Immediate Anesthesia Transfer of Care Note  Patient: [name]  Procedure(s) Performed: Procedure(s) (LRB): SHOULDER ARTHROSCOPY WITH SUBACROMIAL DECOMPRESSION (Right)  Patient Location: PACU  Anesthesia Type: General  Level of Consciousness: awake and alert   Airway & Oxygen Therapy: Patient Spontanous Breathing and Patient connected to face mask oxygen  Post-op Assessment: Report given to PACU RN and Post -op Vital signs reviewed and stable  Post vital signs: Reviewed and stable  Complications: No apparent anesthesia complications

## 2011-05-11 NOTE — Anesthesia Preprocedure Evaluation (Addendum)
Anesthesia Evaluation  Patient identified by MRN, date of birth, ID band Patient awake    Reviewed: Allergy & Precautions, H&P , NPO status , Patient's Chart, lab work & pertinent test results  History of Anesthesia Complications (+) PONV  Airway Mallampati: II TM Distance: >3 FB Neck ROM: Full    Dental  (+) Teeth Intact and Dental Advisory Given   Pulmonary neg pulmonary ROS,  breath sounds clear to auscultation  Pulmonary exam normal       Cardiovascular hypertension, Pt. on medications Rhythm:Regular Rate:Normal     Neuro/Psych ADD; chronic low back pain negative neurological ROS  negative psych ROS   GI/Hepatic negative GI ROS, Neg liver ROS,   Endo/Other  negative endocrine ROS  Renal/GU negative Renal ROS  negative genitourinary   Musculoskeletal negative musculoskeletal ROS (+)   Abdominal Normal abdominal exam  (+)   Peds  Hematology negative hematology ROS (+)   Anesthesia Other Findings   Reproductive/Obstetrics negative OB ROS                          Anesthesia Physical Anesthesia Plan  ASA: II  Anesthesia Plan: General   Post-op Pain Management:    Induction: Intravenous  Airway Management Planned: Oral ETT  Additional Equipment:   Intra-op Plan:   Post-operative Plan: Extubation in OR  Informed Consent: I have reviewed the patients History and Physical, chart, labs and discussed the procedure including the risks, benefits and alternatives for the proposed anesthesia with the patient or authorized representative who has indicated his/her understanding and acceptance.   Dental advisory given  Plan Discussed with: CRNA  Anesthesia Plan Comments:         Anesthesia Quick Evaluation

## 2011-05-12 NOTE — Op Note (Signed)
[name], [name]              ACCOUNT NO.:  [id number]  MEDICAL RECORD NO.:  [id number]  LOCATION:  [facility]                         FACILITY:  [facility]  PHYSICIAN:  Jene Every, M.D.    DATE OF BIRTH:  11/16/1950  DATE OF PROCEDURE: DATE OF DISCHARGE:  05/11/2011                              OPERATIVE REPORT   PREOPERATIVE DIAGNOSIS:  Impingement syndrome and labral tear of the right shoulder.  POSTOPERATIVE DIAGNOSES:  Impingement syndrome and labral tear of the right shoulder, grade 4 chondromalacia of the glenohumeral joint.  PROCEDURES PERFORMED: 1. Exam under anesthesia. 2. Right shoulder arthroscopy with debridement of anterior superior     labrum. 3. Evacuation of loose bodies of glenohumeral joint. 4. Subacromial decompression, acromioplasty and bursectomy.  ANESTHESIA:  General.  ASSISTANT:  Roma Schanz, P.A.  HISTORY:  This 61 year old male fell onto his shoulder, has persistent pain in the shoulder.  Following that, MRI indicating labral tearing, rotator cuff arthropathy, and noted extensive full thickness tear of the rotator cuff.  He had been refractory to conservative treatment including arthroscopic intervention including corticosteroid injection therapy, indicated for arthroscopic subacromial decompression, temporary relief from corticosteroid injection.  Risks and benefits discussed including bleeding, infection, damage to neurovascular structures, no change in symptoms, worsening symptoms, need for repeat debridement, DVT, PE, anesthetic complications, etc.  TECHNIQUE:  With the patient in supine beach chair position, after induction of adequate general anesthesia with 2 g Kefzol, we examined him under anesthesia.  He had full range.  He was prepped and then draped in the usual sterile fashion, and surgical marker utilized along the acromion AC joint coracoid.  Standard posterolateral and anterolateral portals were utilized with incision through  the skin only with a #11 blade with the arm in 70-30 position.  We advanced the arthroscopic camera into the glenohumeral joint in line with the coracoid penetrating atraumatically.  Noted was extensive loose cartilaginous debris, labral tearing, some grade 4 changes of glenoid and the humerus.  Subscapularis was intact as was the rotator cuff. Fashioned the anterior portal after localizing it with an 18-gauge needle, penetrating just beneath the biceps tendon.  It was midway between the coracoid and the anterolateral aspect of the acromion.  I introduced the cannula under direct visualization, and in line with that plane penetrating the capsule, and introduced a shaver and debrided the labrum to a stable base, evacuated the loose bodies, light chondroplasty of the glenoid and a portion of the humerus.  The labrum was significantly degenerated; torn anteriorly, posteriorly, and superiorly; however, there was attachment of the biceps tendon, and it was not detached from the superior surface of the glenoid.  After full evacuation and inspection, I then redirected the camera in the subacromial space.  The biceps tendon just prior to that was intact and in its groove.  In the subacromial space, there was hypertrophic bursa and I fashioned the anterolateral portal through it with incision through the skin only, triangulating hypertrophic bursa was noted. Therefore, performed a full bursectomy, inspected the cuff, no evidence of tear.  We performed bursectomy in the abducted and the adducted position.  Released the CA ligament off the anterolateral aspect of the acromion and  morselized that and saved the anterolateral aspect of the acromion with a 3.5 shaver.  After copious lavage, reinspected the cuff, no tear.  Just some minor fraying of the rotator cuff was noted superficially.  Removed all instrumentation.  Portals were closed with 4- 0 nylon simple sutures, 0.25% Marcaine with epinephrine  was infiltrated in the joint.  Wound was dressed sterilely.  Placed in a sling, extubated without difficulty, and transported to the recovery room in satisfactory condition.  The patient tolerated the procedure well.  No complications.  Assistant was Roma Schanz, P.A.  We used 65 mmHg throughout the case.     Jene Every, M.D.     Cordelia Pen  D:  05/11/2011  T:  05/12/2011  Job:  [id number]

## 2013-01-17 ENCOUNTER — Other Ambulatory Visit: Payer: Self-pay | Admitting: Urology

## 2013-02-05 ENCOUNTER — Encounter (HOSPITAL_COMMUNITY): Payer: Self-pay | Admitting: Pharmacy Technician

## 2013-02-06 ENCOUNTER — Encounter (HOSPITAL_COMMUNITY): Payer: Self-pay

## 2013-02-06 ENCOUNTER — Ambulatory Visit (HOSPITAL_COMMUNITY)
Admission: RE | Admit: 2013-02-06 | Discharge: 2013-02-06 | Disposition: A | Discharge status: Home / Self Care | Payer: BC Managed Care – PPO | Source: Ambulatory Visit | Attending: Urology | Admitting: Urology

## 2013-02-06 ENCOUNTER — Encounter (HOSPITAL_COMMUNITY)
Admission: RE | Admit: 2013-02-06 | Discharge: 2013-02-06 | Disposition: A | Discharge status: Home / Self Care | Payer: BC Managed Care – PPO | Source: Ambulatory Visit | Attending: Urology | Admitting: Urology

## 2013-02-06 DIAGNOSIS — Z0183 Encounter for blood typing: Secondary | ICD-10-CM | POA: Insufficient documentation

## 2013-02-06 DIAGNOSIS — Z01818 Encounter for other preprocedural examination: Secondary | ICD-10-CM | POA: Insufficient documentation

## 2013-02-06 DIAGNOSIS — Z01812 Encounter for preprocedural laboratory examination: Secondary | ICD-10-CM | POA: Insufficient documentation

## 2013-02-06 DIAGNOSIS — Z01810 Encounter for preprocedural cardiovascular examination: Secondary | ICD-10-CM | POA: Insufficient documentation

## 2013-02-06 DIAGNOSIS — I10 Essential (primary) hypertension: Secondary | ICD-10-CM | POA: Insufficient documentation

## 2013-02-06 DIAGNOSIS — C61 Malignant neoplasm of prostate: Secondary | ICD-10-CM | POA: Insufficient documentation

## 2013-02-06 HISTORY — DX: Other specified postprocedural states: Z98.890

## 2013-02-06 HISTORY — DX: Malignant (primary) neoplasm, unspecified: C80.1

## 2013-02-06 HISTORY — DX: Nausea with vomiting, unspecified: R11.2

## 2013-02-06 HISTORY — DX: Pain, unspecified: R52

## 2013-02-06 LAB — CBC
HCT: 47.1 % (ref 39.0–52.0)
Hemoglobin: 15.7 g/dL (ref 13.0–17.0)
MCH: 31.3 pg (ref 26.0–34.0)
MCHC: 33.3 g/dL (ref 30.0–36.0)
MCV: 93.8 fL (ref 78.0–100.0)
PLATELETS: 220 10*3/uL (ref 150–400)
RBC: 5.02 MIL/uL (ref 4.22–5.81)
RDW: 13.1 % (ref 11.5–15.5)
WBC: 7.4 10*3/uL (ref 4.0–10.5)

## 2013-02-06 LAB — BASIC METABOLIC PANEL
BUN: 16 mg/dL (ref 6–23)
CHLORIDE: 101 meq/L (ref 96–112)
CO2: 29 mEq/L (ref 19–32)
Calcium: 9.5 mg/dL (ref 8.4–10.5)
Creatinine, Ser: 0.94 mg/dL (ref 0.50–1.35)
GFR, EST NON AFRICAN AMERICAN: 87 mL/min — AB (ref >90)
Glucose, Bld: 112 mg/dL — ABNORMAL HIGH (ref 70–99)
Potassium: 5.2 mEq/L (ref 3.7–5.3)
SODIUM: 141 meq/L (ref 137–147)

## 2013-02-06 NOTE — Patient Instructions (Addendum)
PER [facility] POLICY  - NO VISITORS UNDER 64 YEARS OF AGE.  FOLLOW YOUR BOWEL PREP INSTRUCTIONS DAY BEFORE SURGERY - INSTRUCTIONS WERE GIVEN BY DR. Cy Blamer OFFICE.   YOUR SURGERY IS SCHEDULED AT [facility]  ON:  Wednesday  2/4  REPORT TO  SHORT STAY CENTER AT:  6:30 AM      PHONE # FOR SHORT STAY IS [phone]  DO NOT EAT OR DRINK ANYTHING AFTER MIDNIGHT THE NIGHT BEFORE YOUR SURGERY.  YOU MAY BRUSH YOUR TEETH, RINSE OUT YOUR MOUTH--BUT NO WATER, NO FOOD, NO CHEWING GUM, NO MINTS, NO CANDIES, NO CHEWING TOBACCO.  PLEASE TAKE THE FOLLOWING MEDICATIONS THE AM OF YOUR SURGERY WITH A FEW SIPS OF WATER:  PRISTIQ, SIMVASTATIN   DO NOT BRING VALUABLES, MONEY, CREDIT CARDS.  DO NOT WEAR JEWELRY, MAKE-UP, NAIL POLISH AND NO METAL PINS OR CLIPS IN YOUR HAIR. CONTACT LENS, DENTURES / PARTIALS, GLASSES SHOULD NOT BE WORN TO SURGERY AND IN MOST CASES-HEARING AIDS WILL NEED TO BE REMOVED.  BRING YOUR GLASSES CASE, ANY EQUIPMENT NEEDED FOR YOUR CONTACT LENS. FOR PATIENTS ADMITTED TO THE HOSPITAL--CHECK OUT TIME THE DAY OF DISCHARGE IS 11:00 AM.  ALL INPATIENT ROOMS ARE PRIVATE - WITH BATHROOM, TELEPHONE, TELEVISION AND WIFI INTERNET.                                                     PLEASE READ OVER ANY  FACT SHEETS THAT YOU WERE GIVEN: BLOOD TRANSFUSION INFORMATION  FAILURE TO FOLLOW THESE INSTRUCTIONS MAY RESULT IN THE CANCELLATION OF YOUR SURGERY. PLEASE BE AWARE THAT YOU MAY NEED ADDITIONAL BLOOD DRAWN DAY OF YOUR SURGERY  PATIENT SIGNATURE_________________________________

## 2013-02-06 NOTE — Pre-Procedure Instructions (Signed)
EKG AND CXR WERE DONE TODAY - PREOP AT [facility]. 

## 2013-02-12 NOTE — H&P (Signed)
History of Present Illness    [name] returns for follow-up to discuss recent prostate ultrasound and biopsy. The patient has had a progressively rising PSA for a couple of years. He initially refused recommendations for biopsy. Most recently his PSA was 6.5 with a reduced PSA 2 percentage. Rectal exam revealed 1+ prostate without worrisome findings. Ultrasound revealed a 32 g prostate again without any worrisome ultrasound findings. Biopsies unfortunately were positive. On the right 3 out of 6 cores were positive for Gleason 3+3 equal 6 cancer. Core involvement was 5-40%. On the left side 2 out of 6 cores were positive again for Gleason 3+3 equal 6 cancer. Core involvement was 5-40%.  There were at least 5 additional cores that showed high-grade PIN and/or atypia. The patient said no obvious complications or problems with biopsy.       He now presents today with his wife to have an extended cancer conference. He is most interested in robotic prostatectomy.  IPSS 2/1  SHIM 25/25    He has a previous history of laparoscopic assisted sigmoid colectomy occurring in 2007 recurrent sigmoid diverticulitis.  I have reviewed his operative report.     Past Medical History Problems  1. History of hypercholesterolemia (V12.29) 2. History of hypertension (V12.59)  Surgical History Problems  1. History of Back Surgery 2. History of Colon Surgery 3. History of Shoulder Surgery  Current Meds 1. Adult Aspirin Low Strength 81 MG Oral Tablet Dispersible;  Therapy: (Recorded:16May2014) to Recorded 2. BuPROPion HCl ER (SR) 150 MG Oral Tablet Extended Release 12 Hour;  Therapy: 23Oct2014 to Recorded 3. Diovan 320 MG Oral Tablet;  Therapy: 02Apr2013 to Recorded 4. Fish Oil CAPS;  Therapy: (Recorded:16May2014) to Recorded 5. Hydrocodone-Acetaminophen 10-325 MG Oral Tablet;  Therapy: 03Apr2014 to Recorded 6. Multi-Day Vitamins TABS;  Therapy: (Recorded:16May2014) to Recorded 7. Pristiq 100  MG Oral Tablet Extended Release 24 Hour;  Therapy: 03Apr2014 to Recorded 8. Simvastatin 20 MG Oral Tablet;  Therapy: [id number] to Recorded 9. Vitamin D3 1000 UNIT Oral Capsule;  Therapy: (Recorded:16May2014) to Recorded  Allergies Medication  1. No Known Drug Allergies  Family History Problems  1. Family history of Acute Myocardial Infarction (V17.3) : Father 2. Family history of Death In The Family Father : Father   66yrs, MI 3. Family history of Family Health Status - Mother's Age : Mother   16yrs 4. Family history of Family Health Status Number Of Children   2 sons 52. Family history of Heart Disease (V17.49) : Mother  Social History Problems  1. Alcohol Use   1 qd 2. Caffeine Use   2 qd 3. Marital History - Currently Married 4. Never A Smoker 5. Occupation:   retired 30. Denied: History of Tobacco Use  Review of Systems Genitourinary, constitutional, skin, eye, otolaryngeal, hematologic/lymphatic, cardiovascular, pulmonary, endocrine, musculoskeletal, gastrointestinal, neurological and psychiatric system(s) were reviewed and pertinent findings if present are noted.  Genitourinary: nocturia and weak urinary stream.  Gastrointestinal: heartburn.  Musculoskeletal: back pain.    Vitals Vital Signs [Data Includes: Last 1 Day]  Recorded: 41PFX9024 03:55PM  Blood Pressure: 138 / 83 Temperature: 97.2 F Heart Rate: 80  Physical Exam Constitutional: Well nourished and well developed . No acute distress.  ENT:. The ears and nose are normal in appearance.  Neck: The appearance of the neck is normal and no neck mass is present.  Pulmonary: No respiratory distress and normal respiratory rhythm and effort.  Cardiovascular: Heart rate and rhythm are normal . No peripheral edema.  Abdomen: The abdomen is soft and nontender. No masses are palpated. No CVA tenderness. No hernias are palpable. No hepatosplenomegaly noted.  Rectal: Rectal exam demonstrates normal sphincter  tone, no tenderness and no masses. Estimated prostate size is 1+. Normal rectal tone, no rectal masses, prostate is smooth, symmetric and non-tender. The prostate has no nodularity and is not tender. The left seminal vesicle is nonpalpable. The right seminal vesicle is nonpalpable. The perineum is normal on inspection.  Genitourinary: Examination of the penis demonstrates no discharge, no masses, no lesions and a normal meatus. The scrotum is without lesions. The right epididymis is palpably normal and non-tender. The left epididymis is palpably normal and non-tender. The right testis is non-tender and without masses. The left testis is non-tender and without masses.  Lymphatics: The femoral and inguinal nodes are not enlarged or tender.   Assessment Assessed  1. Prostate cancer (185) 2. Elevated prostate specific antigen (PSA) (790.93)  Plan Elevated prostate specific antigen (PSA)  1. Follow-up Schedule Surgery Office  Follow-up  will fill out form in am  Status: Hold For -  Appointment  Requested for: [id number]  Discussion/Summary  The patient was counseled about the natural history of prostate cancer and the standard treatment options that are available for prostate cancer. It was explained to him how his age and life expectancy, clinical stage, Gleason score, and PSA affect his prognosis, the decision to proceed with additional staging studies, as well as how that information influences recommended treatment strategies. We discussed the roles for active surveillance, radiation therapy, surgical therapy, androgen deprivation, as well as ablative therapy options for the treatment of prostate cancer as appropriate to his individual cancer situation. We discussed the risks and benefits of these options with regard to their impact on cancer control and also in terms of potential adverse events, complications, and impact on quiality of life particularly related to urinary, bowel, and sexual function.  The patient was encouraged to ask questions throughout the discussion today and all questions were answered to his stated satisfaction. In addition, the patient was provided with and/or directed to appropriate resources and literature for further education about prostate cancer and treatment options.   We discussed surgical therapy for prostate cancer including the different available surgical approaches. We discussed, in detail, the risks and expectations of surgery with regard to cancer control, urinary control, and erectile function as well as the expected postoperative recovery process. The risks, potential complications/adverse events of radical prostatectomy as well as alternative options were explained to the patient.   We discussed surgical therapy for prostate cancer including the different available surgical approaches. We discussed, in detail, the risks and expectations of surgery with regard to cancer control, urinary control, and erectile function as well as the expected postoperative recovery process. Additional risks of surgery including but not limited to bleeding, infection, hernia formation, nerve damage, lymphocele formation, bowel/rectal injury potentially necessitating colostomy, damage to the urinary tract resulting in urine leakage, urethral stricture, and the cardiopulmonary risks such as myocardial infarction, stroke, death, venothromboembolism, etc. were explained. The risk of open surgical conversion for robotic/laparoscopic prostatectomy was also discussed.   60 minutes were spent in face to face consultation with patient today.    AmendmentMister Jones is interested in proceeding with an attempt robotic-assisted laparoscopic prostatectomy. He understands given his previous laparoscopic surgery with diverticular disease and sigmoid resection but there is at least a 30% chance that we will be unable to proceed laparoscopically due to significant adhesions. He would  then prefer to  undergo open prostatectomy. We discussed how that would change potentially his recovery and some of the issues comparing contrast in open versus robotic prostatectomy.  I reviewed nomogram data with him. It appears he has approximately a 70% chance of having organ confined disease. The chances of lymph node involvement or 2% as our seminal vesicle involvement. I'll lymph node dissection should not be necessary.

## 2013-02-12 NOTE — Anesthesia Preprocedure Evaluation (Addendum)
Anesthesia Evaluation  Patient identified by MRN, date of birth, ID band Patient awake    Reviewed: Allergy & Precautions, H&P , NPO status , Patient's Chart, lab work & pertinent test results  History of Anesthesia Complications (+) PONV and history of anesthetic complications  Airway Mallampati: II TM Distance: >3 FB Neck ROM: Full    Dental  (+) Teeth Intact and Dental Advisory Given   Pulmonary neg pulmonary ROS,  breath sounds clear to auscultation  Pulmonary exam normal       Cardiovascular hypertension, Pt. on medications Rhythm:Regular Rate:Normal     Neuro/Psych ADD; chronic low back pain negative neurological ROS  negative psych ROS   GI/Hepatic negative GI ROS, Neg liver ROS,   Endo/Other  negative endocrine ROS  Renal/GU negative Renal ROS  negative genitourinary   Musculoskeletal negative musculoskeletal ROS (+)   Abdominal Normal abdominal exam  (+)   Peds  Hematology negative hematology ROS (+)   Anesthesia Other Findings   Reproductive/Obstetrics negative OB ROS                          Anesthesia Physical  Anesthesia Plan  ASA: II  Anesthesia Plan: General   Post-op Pain Management:    Induction: Intravenous  Airway Management Planned: Oral ETT  Additional Equipment:   Intra-op Plan:   Post-operative Plan: Extubation in OR  Informed Consent: I have reviewed the patients History and Physical, chart, labs and discussed the procedure including the risks, benefits and alternatives for the proposed anesthesia with the patient or authorized representative who has indicated his/her understanding and acceptance.   Dental advisory given  Plan Discussed with: CRNA  Anesthesia Plan Comments:         Anesthesia Quick Evaluation

## 2013-02-13 ENCOUNTER — Encounter (HOSPITAL_COMMUNITY)
Admission: RE | Disposition: A | Discharge status: Home / Self Care | Payer: Self-pay | Source: Ambulatory Visit | Attending: Urology

## 2013-02-13 ENCOUNTER — Inpatient Hospital Stay (HOSPITAL_COMMUNITY): Payer: BC Managed Care – PPO | Admitting: Certified Registered Nurse Anesthetist

## 2013-02-13 ENCOUNTER — Encounter (HOSPITAL_COMMUNITY): Payer: Self-pay | Admitting: *Deleted

## 2013-02-13 ENCOUNTER — Encounter (HOSPITAL_COMMUNITY): Payer: BC Managed Care – PPO | Admitting: Certified Registered Nurse Anesthetist

## 2013-02-13 ENCOUNTER — Inpatient Hospital Stay (HOSPITAL_COMMUNITY)
Admission: RE | Admit: 2013-02-13 | Discharge: 2013-02-14 | DRG: 708 | Disposition: A | Discharge status: Home / Self Care | Payer: BC Managed Care – PPO | Source: Ambulatory Visit | Attending: Urology | Admitting: Urology

## 2013-02-13 DIAGNOSIS — C61 Malignant neoplasm of prostate: Principal | ICD-10-CM | POA: Diagnosis present

## 2013-02-13 DIAGNOSIS — Z79899 Other long term (current) drug therapy: Secondary | ICD-10-CM

## 2013-02-13 DIAGNOSIS — Z7982 Long term (current) use of aspirin: Secondary | ICD-10-CM

## 2013-02-13 DIAGNOSIS — I10 Essential (primary) hypertension: Secondary | ICD-10-CM | POA: Diagnosis present

## 2013-02-13 DIAGNOSIS — E7800 Pure hypercholesterolemia, unspecified: Secondary | ICD-10-CM | POA: Diagnosis present

## 2013-02-13 DIAGNOSIS — Z8249 Family history of ischemic heart disease and other diseases of the circulatory system: Secondary | ICD-10-CM

## 2013-02-13 HISTORY — PX: ROBOT ASSISTED LAPAROSCOPIC RADICAL PROSTATECTOMY: SHX5141

## 2013-02-13 LAB — TYPE AND SCREEN
ABO/RH(D): A POS
ABO/RH(D): A POS
ANTIBODY SCREEN: NEGATIVE
Antibody Screen: NEGATIVE

## 2013-02-13 LAB — HEMOGLOBIN AND HEMATOCRIT, BLOOD
HEMATOCRIT: 43.7 % (ref 39.0–52.0)
Hemoglobin: 14.6 g/dL (ref 13.0–17.0)

## 2013-02-13 SURGERY — ROBOTIC ASSISTED LAPAROSCOPIC RADICAL PROSTATECTOMY
Anesthesia: General

## 2013-02-13 MED ORDER — GLYCOPYRROLATE 0.2 MG/ML IJ SOLN
INTRAMUSCULAR | Status: AC
Start: 2013-02-13 — End: 2013-02-13
  Filled 2013-02-13: qty 3

## 2013-02-13 MED ORDER — NEOSTIGMINE METHYLSULFATE 1 MG/ML IJ SOLN
INTRAMUSCULAR | Status: AC
  Filled 2013-02-13: qty 10

## 2013-02-13 MED ORDER — BUPIVACAINE-EPINEPHRINE 0.25% -1:200000 IJ SOLN
INTRAMUSCULAR | Status: DC | PRN
  Administered 2013-02-13: 16 mL

## 2013-02-13 MED ORDER — ACETAMINOPHEN 325 MG PO TABS: 650.0000 mg | ORAL_TABLET | ORAL | Status: DC | PRN

## 2013-02-13 MED ORDER — HYDROCODONE-ACETAMINOPHEN 5-325 MG PO TABS: 1.0000 | ORAL_TABLET | ORAL | Status: DC | PRN

## 2013-02-13 MED ORDER — SODIUM CHLORIDE 0.9 % IV BOLUS (SEPSIS)
1000.0000 mL | Freq: Once | INTRAVENOUS | Status: AC
  Administered 2013-02-13: 1000 mL via INTRAVENOUS

## 2013-02-13 MED ORDER — ATROPINE SULFATE 0.4 MG/ML IJ SOLN
INTRAMUSCULAR | Status: AC
  Filled 2013-02-13: qty 1

## 2013-02-13 MED ORDER — SCOPOLAMINE 1 MG/3DAYS TD PT72
MEDICATED_PATCH | TRANSDERMAL | Status: AC
  Filled 2013-02-13: qty 1

## 2013-02-13 MED ORDER — LACTATED RINGERS IV SOLN
INTRAVENOUS | Status: DC | PRN
  Administered 2013-02-13: 08:00:00 via INTRAVENOUS

## 2013-02-13 MED ORDER — BUPIVACAINE-EPINEPHRINE PF 0.25-1:200000 % IJ SOLN
INTRAMUSCULAR | Status: AC
  Filled 2013-02-13: qty 30

## 2013-02-13 MED ORDER — SIMVASTATIN 20 MG PO TABS
20.0000 mg | ORAL_TABLET | Freq: Every morning | ORAL | Status: DC
  Administered 2013-02-14: 20 mg via ORAL
  Filled 2013-02-13: qty 1

## 2013-02-13 MED ORDER — HYDROMORPHONE HCL PF 2 MG/ML IJ SOLN
INTRAMUSCULAR | Status: AC
  Filled 2013-02-13: qty 1

## 2013-02-13 MED ORDER — SODIUM CHLORIDE 0.9 % IJ SOLN
INTRAMUSCULAR | Status: AC
  Filled 2013-02-13: qty 10

## 2013-02-13 MED ORDER — NEOSTIGMINE METHYLSULFATE 1 MG/ML IJ SOLN
INTRAMUSCULAR | Status: DC | PRN
Start: 2013-02-13 — End: 2013-02-13
  Administered 2013-02-13: 4 mg via INTRAVENOUS

## 2013-02-13 MED ORDER — FENTANYL CITRATE 0.05 MG/ML IJ SOLN
INTRAMUSCULAR | Status: DC | PRN
  Administered 2013-02-13 (×2): 50 ug via INTRAVENOUS
  Administered 2013-02-13: 100 ug via INTRAVENOUS
  Administered 2013-02-13: 50 ug via INTRAVENOUS

## 2013-02-13 MED ORDER — MORPHINE SULFATE 10 MG/ML IJ SOLN
2.0000 mg | INTRAMUSCULAR | Status: DC | PRN
  Administered 2013-02-14: 2 mg via INTRAVENOUS
  Filled 2013-02-13 (×2): qty 1

## 2013-02-13 MED ORDER — ROCURONIUM BROMIDE 100 MG/10ML IV SOLN
INTRAVENOUS | Status: AC
  Filled 2013-02-13: qty 1

## 2013-02-13 MED ORDER — FENTANYL CITRATE 0.05 MG/ML IJ SOLN
INTRAMUSCULAR | Status: AC
  Filled 2013-02-13: qty 5

## 2013-02-13 MED ORDER — ONDANSETRON HCL 4 MG/2ML IJ SOLN
INTRAMUSCULAR | Status: AC
  Filled 2013-02-13: qty 2

## 2013-02-13 MED ORDER — PROPOFOL 10 MG/ML IV BOLUS
INTRAVENOUS | Status: DC | PRN
  Administered 2013-02-13: 200 mg via INTRAVENOUS

## 2013-02-13 MED ORDER — OXYCODONE HCL 5 MG/5ML PO SOLN
5.0000 mg | Freq: Once | ORAL | Status: DC | PRN
  Filled 2013-02-13: qty 5

## 2013-02-13 MED ORDER — EPHEDRINE SULFATE 50 MG/ML IJ SOLN
INTRAMUSCULAR | Status: AC
  Filled 2013-02-13: qty 1

## 2013-02-13 MED ORDER — HYDROMORPHONE HCL PF 1 MG/ML IJ SOLN
INTRAMUSCULAR | Status: AC
  Administered 2013-02-13: 14:00:00
  Filled 2013-02-13: qty 1

## 2013-02-13 MED ORDER — CIPROFLOXACIN HCL 500 MG PO TABS: 500.0000 mg | ORAL_TABLET | Freq: Two times a day (BID) | ORAL | Status: DC

## 2013-02-13 MED ORDER — PROMETHAZINE HCL 25 MG/ML IJ SOLN: 6.2500 mg | INTRAMUSCULAR | Status: DC | PRN

## 2013-02-13 MED ORDER — MIDAZOLAM HCL 5 MG/5ML IJ SOLN
INTRAMUSCULAR | Status: DC | PRN
  Administered 2013-02-13: 2 mg via INTRAVENOUS

## 2013-02-13 MED ORDER — HYDROMORPHONE HCL PF 1 MG/ML IJ SOLN
INTRAMUSCULAR | Status: DC | PRN
  Administered 2013-02-13: 1 mg via INTRAVENOUS
  Administered 2013-02-13: 0.5 mg via INTRAVENOUS

## 2013-02-13 MED ORDER — HYDROCODONE-ACETAMINOPHEN 10-325 MG PO TABS: 1.0000 | ORAL_TABLET | Freq: Four times a day (QID) | ORAL | Status: DC | PRN

## 2013-02-13 MED ORDER — MEPERIDINE HCL 50 MG/ML IJ SOLN: 6.2500 mg | INTRAMUSCULAR | Status: DC | PRN

## 2013-02-13 MED ORDER — CEFAZOLIN SODIUM-DEXTROSE 2-3 GM-% IV SOLR
INTRAVENOUS | Status: AC
  Filled 2013-02-13: qty 50

## 2013-02-13 MED ORDER — ONDANSETRON HCL 4 MG/2ML IJ SOLN
INTRAMUSCULAR | Status: DC | PRN
  Administered 2013-02-13: 4 mg via INTRAVENOUS

## 2013-02-13 MED ORDER — KETOROLAC TROMETHAMINE 30 MG/ML IJ SOLN
30.0000 mg | Freq: Four times a day (QID) | INTRAMUSCULAR | Status: DC
  Administered 2013-02-13 – 2013-02-14 (×4): 30 mg via INTRAVENOUS
  Filled 2013-02-13 (×5): qty 1

## 2013-02-13 MED ORDER — LIDOCAINE HCL (CARDIAC) 20 MG/ML IV SOLN
INTRAVENOUS | Status: AC
  Filled 2013-02-13: qty 5

## 2013-02-13 MED ORDER — OXYCODONE HCL 5 MG PO TABS: 5.0000 mg | ORAL_TABLET | Freq: Once | ORAL | Status: DC | PRN

## 2013-02-13 MED ORDER — STERILE WATER FOR IRRIGATION IR SOLN
Status: DC | PRN
  Administered 2013-02-13: 1500 mL

## 2013-02-13 MED ORDER — ROCURONIUM BROMIDE 100 MG/10ML IV SOLN
INTRAVENOUS | Status: DC | PRN
  Administered 2013-02-13: 50 mg via INTRAVENOUS
  Administered 2013-02-13: 20 mg via INTRAVENOUS
  Administered 2013-02-13 (×2): 10 mg via INTRAVENOUS

## 2013-02-13 MED ORDER — DEXTROSE-NACL 5-0.45 % IV SOLN
INTRAVENOUS | Status: DC
  Administered 2013-02-13 – 2013-02-14 (×3): via INTRAVENOUS

## 2013-02-13 MED ORDER — PHENYLEPHRINE HCL 10 MG/ML IJ SOLN
INTRAMUSCULAR | Status: AC
Start: 2013-02-13 — End: 2013-02-13
  Filled 2013-02-13: qty 1

## 2013-02-13 MED ORDER — SUCCINYLCHOLINE CHLORIDE 20 MG/ML IJ SOLN
INTRAMUSCULAR | Status: AC
  Filled 2013-02-13: qty 1

## 2013-02-13 MED ORDER — SODIUM CHLORIDE 0.9 % IR SOLN
Status: DC | PRN
  Administered 2013-02-13: 300 mL via INTRAVESICAL

## 2013-02-13 MED ORDER — DESVENLAFAXINE SUCCINATE ER 100 MG PO TB24
100.0000 mg | ORAL_TABLET | Freq: Every day | ORAL | Status: DC
  Administered 2013-02-14: 100 mg via ORAL
  Filled 2013-02-13 (×2): qty 1

## 2013-02-13 MED ORDER — IRBESARTAN 300 MG PO TABS
300.0000 mg | ORAL_TABLET | Freq: Every day | ORAL | Status: DC
  Administered 2013-02-14: 300 mg via ORAL
  Filled 2013-02-13 (×2): qty 1

## 2013-02-13 MED ORDER — KETOROLAC TROMETHAMINE 30 MG/ML IJ SOLN
INTRAMUSCULAR | Status: AC
  Administered 2013-02-13: 12:00:00
  Filled 2013-02-13: qty 1

## 2013-02-13 MED ORDER — LIDOCAINE HCL (CARDIAC) 20 MG/ML IV SOLN
INTRAVENOUS | Status: DC | PRN
  Administered 2013-02-13: 80 mg via INTRAVENOUS

## 2013-02-13 MED ORDER — GLYCOPYRROLATE 0.2 MG/ML IJ SOLN
INTRAMUSCULAR | Status: DC | PRN
Start: 2013-02-13 — End: 2013-02-13
  Administered 2013-02-13: 0.6 mg via INTRAVENOUS

## 2013-02-13 MED ORDER — GLYCOPYRROLATE 0.2 MG/ML IJ SOLN
INTRAMUSCULAR | Status: AC
  Filled 2013-02-13: qty 1

## 2013-02-13 MED ORDER — DEXAMETHASONE SODIUM PHOSPHATE 10 MG/ML IJ SOLN
INTRAMUSCULAR | Status: DC | PRN
  Administered 2013-02-13: 10 mg via INTRAVENOUS

## 2013-02-13 MED ORDER — SCOPOLAMINE 1 MG/3DAYS TD PT72
MEDICATED_PATCH | TRANSDERMAL | Status: DC | PRN
  Administered 2013-02-13: 1 via TRANSDERMAL

## 2013-02-13 MED ORDER — DEXAMETHASONE SODIUM PHOSPHATE 10 MG/ML IJ SOLN
INTRAMUSCULAR | Status: AC
  Filled 2013-02-13: qty 1

## 2013-02-13 MED ORDER — PHENYLEPHRINE HCL 10 MG/ML IJ SOLN
10.0000 mg | INTRAVENOUS | Status: DC | PRN
  Administered 2013-02-13: 20 ug/min via INTRAVENOUS

## 2013-02-13 MED ORDER — SUCCINYLCHOLINE CHLORIDE 20 MG/ML IJ SOLN
INTRAMUSCULAR | Status: DC | PRN
  Administered 2013-02-13: 100 mg via INTRAVENOUS

## 2013-02-13 MED ORDER — HEPARIN SODIUM (PORCINE) 1000 UNIT/ML IJ SOLN
INTRAMUSCULAR | Status: AC
  Filled 2013-02-13: qty 1

## 2013-02-13 MED ORDER — MIDAZOLAM HCL 2 MG/2ML IJ SOLN
INTRAMUSCULAR | Status: AC
  Filled 2013-02-13: qty 2

## 2013-02-13 MED ORDER — PROPOFOL 10 MG/ML IV BOLUS
INTRAVENOUS | Status: AC
  Filled 2013-02-13: qty 20

## 2013-02-13 MED ORDER — LACTATED RINGERS IV SOLN
INTRAVENOUS | Status: DC | PRN
  Administered 2013-02-13: 10:00:00

## 2013-02-13 MED ORDER — HYDROMORPHONE HCL PF 1 MG/ML IJ SOLN
0.2500 mg | INTRAMUSCULAR | Status: DC | PRN
  Administered 2013-02-13: 0.5 mg via INTRAVENOUS
  Administered 2013-02-13 (×2): 0.25 mg via INTRAVENOUS

## 2013-02-13 MED ORDER — CEFAZOLIN SODIUM-DEXTROSE 2-3 GM-% IV SOLR
2.0000 g | INTRAVENOUS | Status: AC
  Administered 2013-02-13: 2 g via INTRAVENOUS

## 2013-02-13 SURGICAL SUPPLY — 50 items
APPLICATOR SURGIFLO ENDO (HEMOSTASIS) ×4 IMPLANT
CABLE HIGH FREQUENCY MONO STRZ (ELECTRODE) ×4 IMPLANT
CANISTER SUCTION 2500CC (MISCELLANEOUS) ×4 IMPLANT
CATH FOLEY 2WAY SLVR 18FR 30CC (CATHETERS) ×4 IMPLANT
CATH ROBINSON RED A/P 16FR (CATHETERS) ×4 IMPLANT
CATH ROBINSON RED A/P 8FR (CATHETERS) ×4 IMPLANT
CATH TIEMANN FOLEY 18FR 5CC (CATHETERS) ×4 IMPLANT
CHLORAPREP W/TINT 26ML (MISCELLANEOUS) ×4 IMPLANT
CLIP LIGATING HEM O LOK PURPLE (MISCELLANEOUS) IMPLANT
CLOTH BEACON ORANGE TIMEOUT ST (SAFETY) ×4 IMPLANT
COVER SURGICAL LIGHT HANDLE (MISCELLANEOUS) ×4 IMPLANT
COVER TIP SHEARS 8 DVNC (MISCELLANEOUS) ×2 IMPLANT
COVER TIP SHEARS 8MM DA VINCI (MISCELLANEOUS) ×2
CUTTER ECHEON FLEX ENDO 45 340 (ENDOMECHANICALS) ×4 IMPLANT
DECANTER SPIKE VIAL GLASS SM (MISCELLANEOUS) ×4 IMPLANT
DRAPE SURG IRRIG POUCH 19X23 (DRAPES) ×4 IMPLANT
DRSG TEGADERM 2-3/8X2-3/4 SM (GAUZE/BANDAGES/DRESSINGS) ×16 IMPLANT
DRSG TEGADERM 4X4.75 (GAUZE/BANDAGES/DRESSINGS) ×8 IMPLANT
DRSG TEGADERM 6X8 (GAUZE/BANDAGES/DRESSINGS) ×8 IMPLANT
ELECT REM PT RETURN 9FT ADLT (ELECTROSURGICAL) ×4
ELECTRODE REM PT RTRN 9FT ADLT (ELECTROSURGICAL) ×2 IMPLANT
GAUZE SPONGE 2X2 8PLY STRL LF (GAUZE/BANDAGES/DRESSINGS) ×2 IMPLANT
GLOVE BIO SURGEON STRL SZ 6.5 (GLOVE) ×3 IMPLANT
GLOVE BIO SURGEONS STRL SZ 6.5 (GLOVE) ×1
GLOVE BIOGEL M STRL SZ7.5 (GLOVE) ×8 IMPLANT
GOWN STRL REUS W/TWL LRG LVL3 (GOWN DISPOSABLE) ×4 IMPLANT
GOWN STRL REUS W/TWL XL LVL3 (GOWN DISPOSABLE) ×8 IMPLANT
HEMOSTAT SURGICEL 4X8 (HEMOSTASIS) ×4 IMPLANT
HOLDER FOLEY CATH W/STRAP (MISCELLANEOUS) ×4 IMPLANT
IV LACTATED RINGERS 1000ML (IV SOLUTION) ×4 IMPLANT
KIT ACCESSORY DA VINCI DISP (KITS) ×2
KIT ACCESSORY DVNC DISP (KITS) ×2 IMPLANT
NDL SAFETY ECLIPSE 18X1.5 (NEEDLE) ×2 IMPLANT
NEEDLE HYPO 18GX1.5 SHARP (NEEDLE) ×2
PACK ROBOT UROLOGY CUSTOM (CUSTOM PROCEDURE TRAY) ×4 IMPLANT
RELOAD GREEN ECHELON 45 (STAPLE) ×4 IMPLANT
SEALER TISSUE G2 CVD JAW 45CM (ENDOMECHANICALS) IMPLANT
SET TUBE IRRIG SUCTION NO TIP (IRRIGATION / IRRIGATOR) ×4 IMPLANT
SOLUTION ELECTROLUBE (MISCELLANEOUS) ×4 IMPLANT
SPONGE GAUZE 2X2 STER 10/PKG (GAUZE/BANDAGES/DRESSINGS) ×2
SURGIFLO W/THROMBIN 8M KIT (HEMOSTASIS) ×4 IMPLANT
SUT ETHILON 3 0 PS 1 (SUTURE) ×4 IMPLANT
SUT VIC AB 2-0 SH 27 (SUTURE) ×2
SUT VIC AB 2-0 SH 27X BRD (SUTURE) ×2 IMPLANT
SUT VICRYL 0 UR6 27IN ABS (SUTURE) ×8 IMPLANT
SUT VLOC BARB 180 ABS3/0GR12 (SUTURE) ×8
SUTURE VLOC BRB 180 ABS3/0GR12 (SUTURE) ×4 IMPLANT
SYR 27GX1/2 1ML LL SAFETY (SYRINGE) ×4 IMPLANT
TOWEL OR NON WOVEN STRL DISP B (DISPOSABLE) ×4 IMPLANT
WATER STERILE IRR 1500ML POUR (IV SOLUTION) ×8 IMPLANT

## 2013-02-13 NOTE — Interval H&P Note (Signed)
History and Physical Interval Note:  02/13/2013 8:16 AM  [name]  has presented today for surgery, with the diagnosis of PROSTATE CANCER  The various methods of treatment have been discussed with the patient and family. After consideration of risks, benefits and other options for treatment, the patient has consented to  Procedure(s): ROBOTIC ASSISTED LAPAROSCOPIC RADICAL PROSTATECTOMY, POSSIBLE OPEN PROSTATECTOMY, POSSIBLE LYSIS OF PELVIC / INTRA-ABDOMINAL ADHESIONS (N/A) LYMPHADENECTOMY -  PELVIC LYMPH NODE DISSECTION (Bilateral) as a surgical intervention .  The patient's history has been reviewed, patient examined, no change in status, stable for surgery.  I have reviewed the patient's chart and labs.  Questions were answered to the patient's satisfaction.     [name]

## 2013-02-13 NOTE — Transfer of Care (Signed)
Immediate Anesthesia Transfer of Care Note  Patient: [name]  Procedure(s) Performed: Procedure(s) (LRB): ROBOTIC ASSISTED LAPAROSCOPIC RADICAL PROSTATECTOMY,, LYSIS OF PELVIC / INTRA-ABDOMINAL ADHESIONS (N/A)  Patient Location: PACU  Anesthesia Type: General  Level of Consciousness: sedated, patient cooperative and responds to stimulation  Airway & Oxygen Therapy: Patient Spontanous Breathing and Patient connected to face mask oxgen  Post-op Assessment: Report given to PACU RN and Post -op Vital signs reviewed and stable  Post vital signs: Reviewed and stable  Complications: No apparent anesthesia complications

## 2013-02-13 NOTE — Discharge Instructions (Signed)
1. Activity:  You are encouraged to ambulate frequently (about every hour during waking hours) to help prevent blood clots from forming in your legs or lungs.  However, you should not engage in any heavy lifting (> 10-15 lbs), strenuous activity, or straining. °2. Diet: You should continue a clear liquid diet until passing gas from below.  Once this occurs, you may advance your diet to a soft diet that would be easy to digest (i.e soups, scrambled eggs, mashed potatoes, etc.) for 24 hours just as you would if getting over a bad stomach flu.  If tolerating this diet well for 24 hours, you may then begin eating regular food.  It will be normal to have some amount of bloating, nausea, and abdominal discomfort intermittently. °3. Prescriptions:  You will be provided a prescription for pain medication to take as needed.  If your pain is not severe enough to require the prescription pain medication, you may take extra strength Tylenol instead.  You should also take an over the counter stool softener (Colace 100 mg twice daily) to avoid straining with bowel movements as the pain medication may constipate you. Finally, you will also be provided a prescription for an antibiotic to begin the day prior to your return visit in the office for catheter removal. °4. Catheter care: You will be taught how to take care of the catheter by the nursing staff prior to discharge from the hospital.  You may use both a leg bag and the larger bedside bag but it is recommended to at least use the bigger bedside bag at nighttime as the leg bag is small and will fill up overnight and also does not drain as well when lying flat. You may periodically feel a strong urge to void with the catheter in place.  This is a bladder spasm and most often can occur when having a bowel movement or when you are moving around. It is typically self-limited and usually will stop after a few minutes.  You may use some Vaseline or Neosporin around the tip of the  catheter to reduce friction at the tip of the penis. °5. Incisions: You may remove your dressing bandages the 2nd day after surgery.  You most likely will have a few small staples in each of the incisions and once the bandages are removed, the incisions may stay open to air.  You may start showering (not soaking or bathing in water) 48 hours after surgery and the incisions simply need to be patted dry after the shower.  No additional care is needed. °6. What to call us about: You should call the office ([phone]) if you develop fever > 101, persistent vomiting, or the catheter stops draining. Also, feel free to call with any other questions you may have and remember the handout that was provided to you as a reference preoperatively which answers many of the common questions that arise after surgery. ° °You may resume aspirin, aleve, advil, vitamins, and supplements 7 days after surgery. °

## 2013-02-13 NOTE — Preoperative (Signed)
Beta Blockers   Reason not to administer Beta Blockers:Not Applicable 

## 2013-02-13 NOTE — Op Note (Signed)
Preoperative diagnosis: Clinical stage T1c Adenocarcinoma prostate  Postoperative diagnosis: Same  Procedure: Robotic-assisted laparoscopic radical retropubic prostatectomy with lysis of adhesions Surgeon: Bernestine Amass, MD  Asst.: Leta Baptist, PA Anesthesia: Gen. Endotracheal  Indications: Patient was diagnosed with clinical stage TIc Adenocarcinoma the prostate. He underwent extensive consultation with regard to treatment options. The patient decided on a surgical approach. He appeared to understand the distinct advantages as well as the disadvantages of this procedure. The patient has performed a mechanical bowel prep. He has had placement of PAS compression boots and has received perioperative antibiotics. The patient's preoperative PSA was 6.5. Ultrasound revealed a 30 g prostate.   Technique and findings:The patient was brought to the operating room and had successful induction of general endotracheal anesthesia.the patient was placed in a low lithotomy position with careful padding of all extremities. He was secured to the operative table and placed in the steep Trendelenburg position. He was prepped and draped in usual manner. A Foley catheter was placed sterilely on the field. Camera port site was chosen 18 cm above the pubic symphysis just to the left of the umbilicus. A standard open Hassan technique was utilized. A 12 mm trocar was placed without difficulty. The camera was then inserted. The patient developed moderate degree of adhesions in the left lower quadrant. These portion however did not cover of any of the proposed trocar sites. The trochars were placed with direct visual guidance. This included 3 22mm robotic trochars and a 12 mm and 5 mm assist ports. Once all the ports were placed the robot was docked. Attention was turned towards adhesions in the left lower quadrant. These were freed with a combination of cold and hot scissors and no evidence of bowel injury occurred. The bladder  was filled and the space of Retzius was developed with electrocautery dissection as well as blunt dissection. Superficial fat off the endopelvic fascia and bladder neck was removed with electrocautery scissors. The endopelvic fascia was then incised bilaterally from base to apex. Levator musculature was swept off the apex of the prostate isolating the dorsal venous complex which was then stapled with the ETS stapling device. The anterior bladder neck was identified with the aid of the Foley balloon. This was then transected down to the Foley catheter with electrocautery scissors. The Foley catheter was then retracted anteriorly. The posterior bladder neck was then transected and the dissection carried down to the adnexal structures. The seminal vesicles and vas deferens on both sides were then individually dissected free and retracted anteriorly. The posterior plane between the rectum and prostate was then established primarily with blunt dissection.  Attention was then turned towards nerve sparing. The patient was felt to be a candidate for bilateral nerve sparing. Superficial fascia along the anterior lateral aspect of the prostate was incised bilaterally. This tissue was then swept laterally until we were able to establish a groove between the neurovascular tissue and the posterior lateral aspect on the prostate bilaterally. This groove was then extended from the apex back to the base of the prostate. With the prostate retracted anteriorly the vascular pedicles of the prostate were taken with clips. The Foley catheter was then reinserted and the anterior urethra was transected. The posterior urethra was then transected as were some rectourethralis fibers. The prostate was then removed from the pelvis. The pelvis was then copiously irrigated. Rectal insufflation was performed and there was no evidence of rectal injury.  Attention was then turned towards bilateral pelvic lymph node dissection. The  obturator node  packets were removed I laterally and the dissection extended towards the bifurcation of the iliac artery. The obturator nerve was identified on both sides and preserved. Hemalock clips were used for small veins and lymphatic channels. The node packets were sent for permanent analysis.  Attention was then turned towards reconstruction. The bladder neck did not require any reconstruction. The bladder neck and posterior urethra were reapproximated at the 6:00 position utilizing a 2-0 Vicryl suture. The rest of the anastomosis was done with a double-armed 3-0 V-lock suture in a 360 degree manner. A new catheter was placed and bladder irrigation revealed no evidence of leakage. A Blake drain was placed through one of the robotic trochars and positioned in the retropubic space above the anastomosis. This was then secured to the skin with a nylon suture. The prostate was placed in the Endopouch retrieval bag. The 12 mm trocar site was closed with a Vicryl suture with the aid of a suture passer. Our other trochars were taken out with direct visual guidance without evidence of any bleeding. The camera port incision was extended slightly to allow for removal of the specimen and then closed with a running Vicryl suture. All port sites were infiltrated with Marcaine and then closed with surgical clips. The patient was then taken to recovery room having had no obvious complications or problems. Sponge and needle counts were correct.

## 2013-02-13 NOTE — Progress Notes (Signed)
Pt arrived to unit from PACU on stretcher. Slid self to bed. Pt VSS. States pain 3/10 and tolerable. Foley w/ dark pink urine. JP to LLQ with SS drainage. Dsgs to abd d/i. Pt and family oriented to callbell and environment. POC discussed.

## 2013-02-13 NOTE — Anesthesia Postprocedure Evaluation (Signed)
Anesthesia Post Note  Patient: [name]  Procedure(s) Performed: Procedure(s) (LRB): ROBOTIC ASSISTED LAPAROSCOPIC RADICAL PROSTATECTOMY,, LYSIS OF PELVIC / INTRA-ABDOMINAL ADHESIONS (N/A)  Anesthesia type: General  Patient location: PACU  Post pain: Pain level controlled  Post assessment: Post-op Vital signs reviewed  Last Vitals: BP 132/70  Pulse 92  Temp(Src) 37 C (Oral)  Resp 16  Ht 5\' 8"  (1.727 m)  Wt 167 lb (75.751 kg)  BMI 25.40 kg/m2  SpO2 97%  Post vital signs: Reviewed  Level of consciousness: sedated  Complications: No apparent anesthesia complications

## 2013-02-13 NOTE — Progress Notes (Signed)
Patient ID: [name], male   DOB: 02/17/1950, 63 y.o.   MRN: [id number] Post-op note  Subjective: The patient is doing well.  No complaints.  Denies N/V  Objective: Vital signs in last 24 hours: Temp:  [97.7 F (36.5 C)-98.6 F (37 C)] 98.1 F (36.7 C) (02/04 1516) Pulse Rate:  [81-94] 94 (02/04 1516) Resp:  [14-18] 16 (02/04 1516) BP: (132-150)/(72-88) 134/72 mmHg (02/04 1516) SpO2:  [95 %-99 %] 96 % (02/04 1516) Weight:  [75.751 kg (167 lb)] 75.751 kg (167 lb) (02/04 0937)  Intake/Output from previous day:   Intake/Output this shift: Total I/O In: 845.8 [I.V.:845.8] Out: 385 [Urine:215; Drains:70; Blood:100]  Physical Exam:  General: Alert and oriented. Abdomen: Soft, Nondistended. Incisions: Clean and dry. Foley: red urine  Lab Results:  Recent Labs  02/13/13 1122  HGB 14.6  HCT 43.7    Assessment/Plan: POD#0   1) Continue to monitor 2) amb, IS, clears, pain control, DVT prophy     LOS: 0 days   YARBROUGH,Demontrez Rindfleisch G. 02/13/2013, 4:15 PM

## 2013-02-14 ENCOUNTER — Encounter (HOSPITAL_COMMUNITY): Payer: Self-pay | Admitting: Urology

## 2013-02-14 LAB — BASIC METABOLIC PANEL
BUN: 10 mg/dL (ref 6–23)
CALCIUM: 7.8 mg/dL — AB (ref 8.4–10.5)
CHLORIDE: 104 meq/L (ref 96–112)
CO2: 27 mEq/L (ref 19–32)
CREATININE: 0.91 mg/dL (ref 0.50–1.35)
GFR calc non Af Amer: 88 mL/min — ABNORMAL LOW (ref >90)
Glucose, Bld: 146 mg/dL — ABNORMAL HIGH (ref 70–99)
Potassium: 4.1 mEq/L (ref 3.7–5.3)
SODIUM: 139 meq/L (ref 137–147)

## 2013-02-14 LAB — HEMOGLOBIN AND HEMATOCRIT, BLOOD
HCT: 40.2 % (ref 39.0–52.0)
HEMATOCRIT: 37.9 % — AB (ref 39.0–52.0)
Hemoglobin: 12.5 g/dL — ABNORMAL LOW (ref 13.0–17.0)
Hemoglobin: 13.2 g/dL (ref 13.0–17.0)

## 2013-02-14 MED ORDER — BISACODYL 10 MG RE SUPP
10.0000 mg | Freq: Once | RECTAL | Status: AC
  Administered 2013-02-14: 10 mg via RECTAL
  Filled 2013-02-14: qty 1

## 2013-02-14 NOTE — Discharge Summary (Signed)
  Date of admission: 02/13/2013  Date of discharge: 02/14/2013  Admission diagnosis: Prostate Cancer  Discharge diagnosis: Prostate Cancer  History and Physical: For full details, please see admission history and physical. Briefly, Billy FRIGON is a 63 y.o. gentleman with localized prostate cancer.  After discussing management/treatment options, he elected to proceed with surgical treatment.  Hospital Course: TOBBY FAWCETT was taken to the operating room on 02/13/2013 and underwent a robotic assisted laparoscopic radical prostatectomy. He tolerated this procedure well and without complications. Postoperatively, he was able to be transferred to a regular hospital room following recovery from anesthesia.  He was able to begin ambulating the night of surgery. He remained hemodynamically stable overnight.  He had excellent urine output with appropriately minimal output from his pelvic drain and his pelvic drain was removed on POD #1.  He was transitioned to oral pain medication, tolerated a clear liquid diet, and had met all discharge criteria and was able to be discharged home later on POD#1.  Laboratory values:  Recent Labs  02/13/13 1122 02/14/13 0325 02/14/13 1145  HGB 14.6 12.5* 13.2  HCT 43.7 37.9* 40.2    Disposition: Home  Discharge instruction: He was instructed to be ambulatory but to refrain from heavy lifting, strenuous activity, or driving. He was instructed on urethral catheter care.  Discharge medications:     Medication List    STOP taking these medications       aspirin EC 81 MG tablet     ibuprofen 200 MG tablet  Commonly known as:  ADVIL,MOTRIN     multivitamin with minerals Tabs tablet     Omega 3 1200 MG Caps      TAKE these medications       ciprofloxacin 500 MG tablet  Commonly known as:  CIPRO  Take 1 tablet (500 mg total) by mouth 2 (two) times daily. Start day prior to office visit for foley removal     desvenlafaxine 100 MG 24 hr tablet   Commonly known as:  PRISTIQ  Take 100 mg by mouth daily with breakfast.     HYDROcodone-acetaminophen 10-325 MG per tablet  Commonly known as:  NORCO  Take 1 tablet by mouth every 6 (six) hours as needed for moderate pain.     simvastatin 20 MG tablet  Commonly known as:  ZOCOR  Take 20 mg by mouth every morning.     valsartan 320 MG tablet  Commonly known as:  DIOVAN  Take 320 mg by mouth daily with breakfast.        Followup: He will followup in 1 week for catheter removal and to discuss his surgical pathology results.

## 2013-02-14 NOTE — Progress Notes (Signed)
Foley care and leg bag teaching done w/ pt and wife. Pt demonstrated competency w/ foley care/leg bag and verbalized comfort. D/C instructions reviewed w/ pt and wife. Both verbalized understanding, all questions answered. Pt d/c in w/c in stable condition to wife's car by NT. Pt in possession of d/c instructions, scripts, foley bags, leg bags, dsg supplies, and all personal belongings.

## 2013-02-14 NOTE — Progress Notes (Signed)
Patient ID: [name], male   DOB: 1950/10/26, 63 y.o.   MRN: [id number] 1 Day Post-Op Subjective: The patient is doing well.  No nausea or vomiting. Pain is adequately controlled.  Feels well except for back pain which is chronic.  Amb without difficulty and using IS.  H/H dropped 2g postop   Objective: Vital signs in last 24 hours: Temp:  [97.9 F (36.6 C)-98.6 F (37 C)] 98.2 F (36.8 C) (02/05 0215) Pulse Rate:  [75-94] 75 (02/05 0215) Resp:  [14-16] 16 (02/05 0215) BP: (112-142)/(63-77) 112/63 mmHg (02/05 0215) SpO2:  [95 %-100 %] 98 % (02/05 0215) Weight:  [75.751 kg (167 lb)] 75.751 kg (167 lb) (02/04 0937)  Intake/Output from previous day: 02/04 0701 - 02/05 0700 In: 2124.6 [P.O.:360; I.V.:1764.6] Out: 1800 [Urine:1565; Drains:135; Blood:100] Intake/Output this shift:    Physical Exam:  General: Alert and oriented. CV: RRR Lungs: Clear bilaterally. GI: Soft, Nondistended. Incisions: Dressings intact. Urine: dark yellow Extremities: Nontender, no erythema, no edema.  Lab Results:  Recent Labs  02/13/13 1122 02/14/13 0325  HGB 14.6 12.5*  HCT 43.7 37.9*      Assessment/Plan: POD# 1 s/p robotic prostatectomy.  1) SL IVF 2) Ambulate, Incentive spirometry 3) Transition to oral pain medication 4) Dulcolax suppository 5) D/C pelvic drain 6) Recheck H/H at 11 to ensure stable 7) Plan for likely discharge later today     LOS: 1 day   YARBROUGH,Cable Fearn G. 02/14/2013, 7:25 AM

## 2013-05-02 IMAGING — CR DG CHEST 2V
2 series · 2 of 2 positions shown · non-contrast
Comparison: 02/29/2008

CLINICAL DATA: Preop for right shoulder arthroscopy.  High blood
pressure.  Nonsmoker.

CHEST - 2 VIEW

[w chest pa]
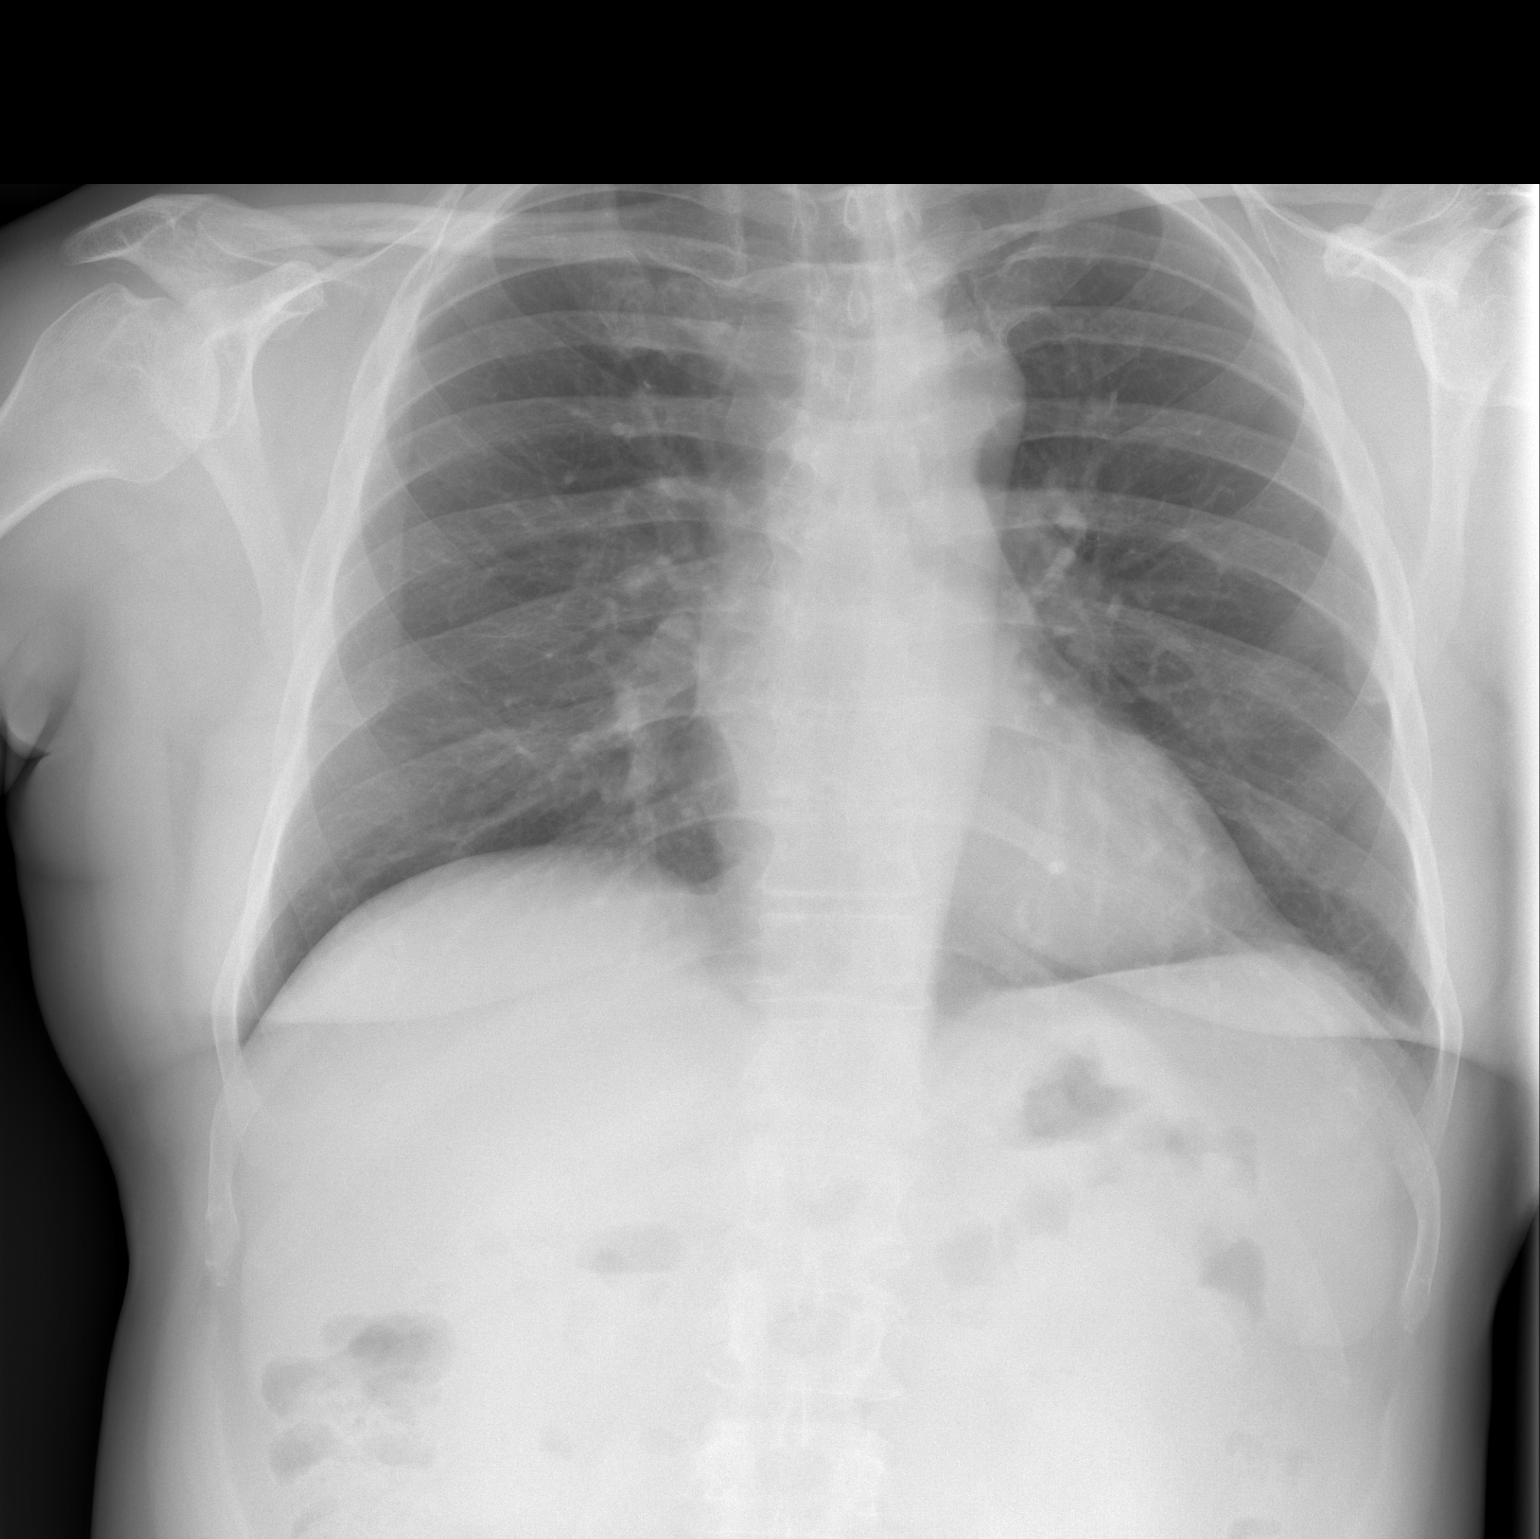

[w chest lat]
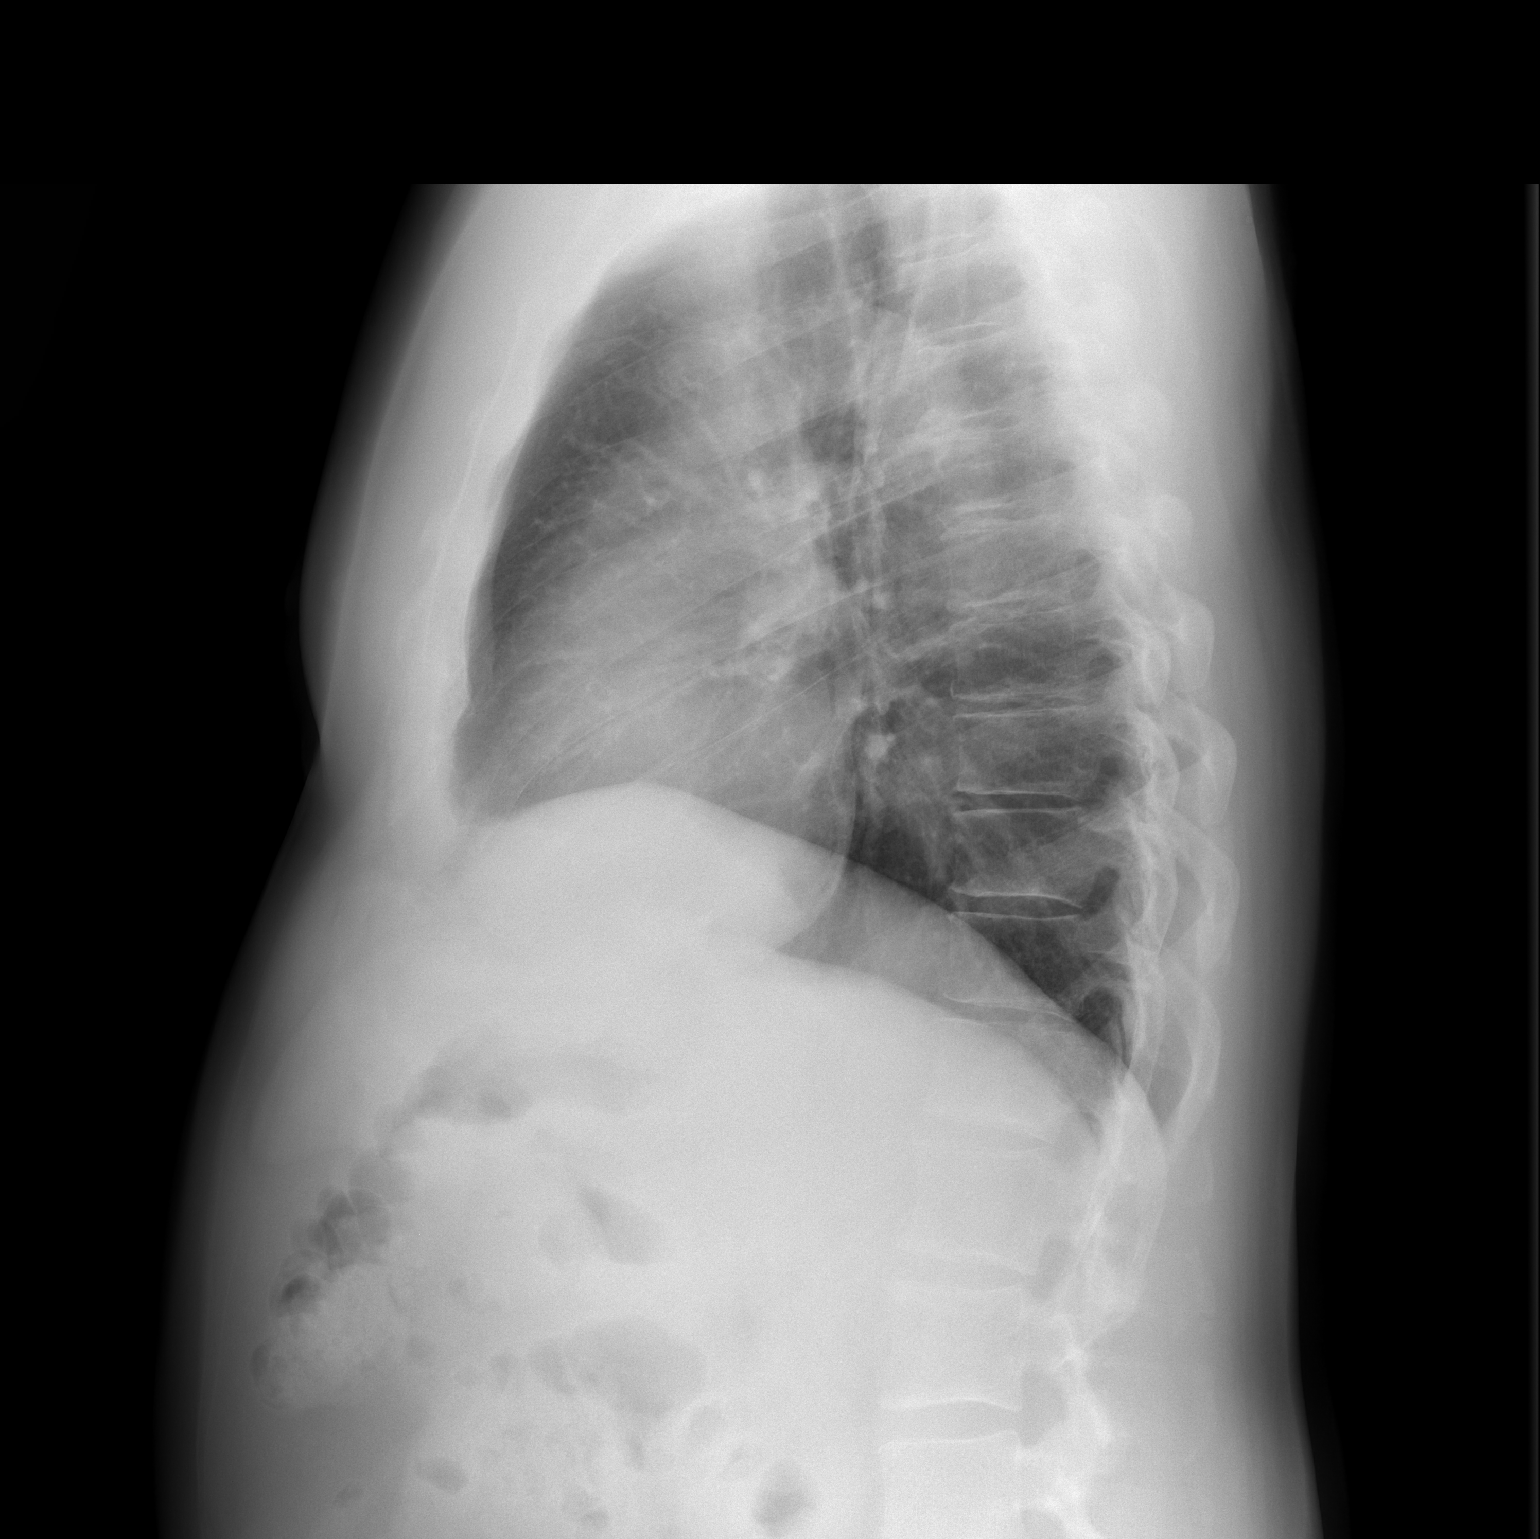

[2 of 2 positions shown; findings below may reference images not displayed]

FINDINGS: Minimal convex left thoracic spine curvature. Midline
trachea.  Normal heart size and mediastinal contours. No pleural
effusion or pneumothorax.  Clear lungs.
IMPRESSION: No acute cardiopulmonary disease.

## 2014-01-10 DIAGNOSIS — M19041 Primary osteoarthritis, right hand: Secondary | ICD-10-CM | POA: Insufficient documentation

## 2014-01-10 DIAGNOSIS — M1990 Unspecified osteoarthritis, unspecified site: Secondary | ICD-10-CM

## 2014-01-10 HISTORY — DX: Unspecified osteoarthritis, unspecified site: M19.90

## 2014-12-08 ENCOUNTER — Ambulatory Visit: Payer: Self-pay | Admitting: Sports Medicine

## 2015-01-14 ENCOUNTER — Encounter: Payer: Self-pay | Admitting: Sports Medicine

## 2015-01-14 ENCOUNTER — Ambulatory Visit (INDEPENDENT_AMBULATORY_CARE_PROVIDER_SITE_OTHER): Payer: PRIVATE HEALTH INSURANCE | Admitting: Sports Medicine

## 2015-01-14 VITALS — BP 155/90 | HR 89 | Temp 97.6°F | Resp 18 | Wt 170.3 lb

## 2015-01-14 DIAGNOSIS — M65331 Trigger finger, right middle finger: Secondary | ICD-10-CM | POA: Diagnosis not present

## 2015-01-14 NOTE — Progress Notes (Signed)
   Subjective:    I'm seeing this patient as a consultation for:  Dr. Maceo Pro  CC:  Trigger finger  HPI: This is a pleasant 65 year old male, for several months he's had increasing pain and triggering of his right middle finger, he did have an injection by Dr. Caralyn Guile over a year ago. Desires repeat interventional treatment today. Symptoms are moderate, persistent without radiation.  Past medical history, Surgical history, Family history not pertinant except as noted below, Social history, Allergies, and medications have been entered into the medical record, reviewed, and no changes needed.   Review of Systems: No headache, visual changes, nausea, vomiting, diarrhea, constipation, dizziness, abdominal pain, skin rash, fevers, chills, night sweats, weight loss, swollen lymph nodes, body aches, joint swelling, muscle aches, chest pain, shortness of breath, mood changes, visual or auditory hallucinations.   Objective:   General: Well Developed, well nourished, and in no acute distress.  Neuro/Psych: Alert and oriented x3, extra-ocular muscles intact, able to move all 4 extremities, sensation grossly intact. Skin: Warm and dry, no rashes noted.  Respiratory: Not using accessory muscles, speaking in full sentences, trachea midline.  Cardiovascular: Pulses palpable, no extremity edema. Abdomen: Does not appear distended. Right hand: Palpable triggering with a nodule at the middle finger.  Procedure: Real-time Ultrasound Guided Injection of right third flexor tendon sheath Device: GE Logiq E  Verbal informed consent obtained.  Time-out conducted.  Noted no overlying erythema, induration, or other signs of local infection.  Skin prepped in a sterile fashion.  Local anesthesia: Topical Ethyl chloride.  With sterile technique and under real time ultrasound guidance:  25-gauge needle advanced into flexor tendon sheath and 1 mL kenalog 40, 1 mL lidocaine injected easily. Completed without difficulty   Pain immediately resolved suggesting accurate placement of the medication.  Advised to call if fevers/chills, erythema, induration, drainage, or persistent bleeding.  Images permanently stored and available for review in the ultrasound unit.  Impression: Technically successful ultrasound guided injection.  Impression and Recommendations:   This case required medical decision making of moderate complexity.

## 2015-01-14 NOTE — Assessment & Plan Note (Signed)
Flexor sheath injection as above, return as needed.

## 2015-01-29 ENCOUNTER — Ambulatory Visit (INDEPENDENT_AMBULATORY_CARE_PROVIDER_SITE_OTHER): Payer: PRIVATE HEALTH INSURANCE | Admitting: Sports Medicine

## 2015-01-29 ENCOUNTER — Encounter: Payer: Self-pay | Admitting: Sports Medicine

## 2015-01-29 VITALS — BP 152/95 | HR 76 | Temp 97.9°F | Resp 18 | Ht 68.0 in | Wt 167.3 lb

## 2015-01-29 DIAGNOSIS — I10 Essential (primary) hypertension: Secondary | ICD-10-CM | POA: Diagnosis not present

## 2015-01-29 DIAGNOSIS — Z0000 Encounter for general adult medical examination without abnormal findings: Secondary | ICD-10-CM

## 2015-01-29 DIAGNOSIS — M65331 Trigger finger, right middle finger: Secondary | ICD-10-CM

## 2015-01-29 LAB — CBC
HCT: 47.2 % (ref 39.0–52.0)
Hemoglobin: 15.7 g/dL (ref 13.0–17.0)
MCH: 31 pg (ref 26.0–34.0)
MCHC: 33.3 g/dL (ref 30.0–36.0)
MCV: 93.1 fL (ref 78.0–100.0)
MPV: 9.9 fL (ref 8.6–12.4)
Platelets: 240 10*3/uL (ref 150–400)
RBC: 5.07 MIL/uL (ref 4.22–5.81)
RDW: 13.2 % (ref 11.5–15.5)
WBC: 9.4 10*3/uL (ref 4.0–10.5)

## 2015-01-29 LAB — COMPREHENSIVE METABOLIC PANEL
ALT: 31 U/L (ref 9–46)
AST: 28 U/L (ref 10–35)
Albumin: 4.6 g/dL (ref 3.6–5.1)
Alkaline Phosphatase: 49 U/L (ref 40–115)
BUN: 16 mg/dL (ref 7–25)
CO2: 29 mmol/L (ref 20–31)
Creat: 0.97 mg/dL (ref 0.70–1.25)
Sodium: 138 mmol/L (ref 135–146)
Total Bilirubin: 0.8 mg/dL (ref 0.2–1.2)
Total Protein: 6.9 g/dL (ref 6.1–8.1)

## 2015-01-29 LAB — LIPID PANEL
Cholesterol: 189 mg/dL (ref 125–200)
HDL: 62 mg/dL (ref >=40)
LDL Cholesterol: 109 mg/dL (ref <130)
Total CHOL/HDL Ratio: 3 Ratio (ref <=5.0)
Triglycerides: 91 mg/dL (ref <150)
VLDL: 18 mg/dL (ref <30)

## 2015-01-29 LAB — COMPREHENSIVE METABOLIC PANEL WITH GFR
Calcium: 9.9 mg/dL (ref 8.6–10.3)
Chloride: 100 mmol/L (ref 98–110)
Glucose, Bld: 98 mg/dL (ref 65–99)
Potassium: 5.6 mmol/L — ABNORMAL HIGH (ref 3.5–5.3)

## 2015-01-29 LAB — TSH: TSH: 1.885 u[IU]/mL (ref 0.350–4.500)

## 2015-01-29 NOTE — Assessment & Plan Note (Signed)
We will discuss this in further detail a future visit

## 2015-01-29 NOTE — Assessment & Plan Note (Signed)
Ordering routine blood work, Medicare physical performed, up-to-date on screening measures.

## 2015-01-29 NOTE — Progress Notes (Signed)
    Subjective:    [name] is a 65 y.o. male who presents for a welcome to Medicare exam.   We did a right trigger finger injection at the last visit, he still has some triggering but no pain, happy with results and feels as though his hand is functional.  Cardiac risk factors: advanced age (older than 49 for men, 29 for women), hypertension and male gender.  Depression Screen (Note: if answer to either of the following is "Yes", a more complete depression screening is indicated)  Q1: Over the past two weeks, have you felt down, depressed or hopeless? no Q2: Over the past two weeks, have you felt little interest or pleasure in doing things? no  Activities of Daily Living In your present state of health, do you have any difficulty performing the following activities?:  Preparing food and eating?: No Bathing yourself: No Getting dressed: No Using the toilet:No Moving around from place to place: No In the past year have you fallen or had a near fall?:No  Current exercise habits: The patient does not participate in regular exercise at present.  Dietary issues discussed: No  Hearing difficulties: No Safe in current home environment: yes  The following portions of the patient's history were reviewed and updated as appropriate: allergies, current medications, past family history, past medical history, past social history, past surgical history and problem list. Review of Systems A comprehensive review of systems was negative.    Objective:     Vision by Snellen chart: right eye:20/20, left eye:20/20 Blood pressure 152/95, pulse 76, temperature 97.9 F (36.6 C), resp. rate 18, height 5\' 8"  (1.727 m), weight 167 lb 4.8 oz (75.887 kg), SpO2 99 %. Body mass index is 25.44 kg/(m^2).    General: Well Developed, well nourished, and in no acute distress.  Neuro: Alert and oriented x3, extra-ocular muscles intact, sensation grossly intact. Cranial nerves II through XII are intact,  motor, sensory, and coordinative functions are all intact. HEENT: Normocephalic, atraumatic, pupils equal round reactive to light, neck supple, no masses, no lymphadenopathy, thyroid nonpalpable. Oropharynx, nasopharynx, external ear canals are unremarkable. Skin: Warm and dry, no rashes noted.  Cardiac: Regular rate and rhythm, no murmurs rubs or gallops.  Respiratory: Clear to auscultation bilaterally. Not using accessory muscles, speaking in full sentences.  Abdominal: Soft, nontender, nondistended, positive bowel sounds, no masses, no organomegaly.  Musculoskeletal: Shoulder, elbow, wrist, hip, knee, ankle stable, and with full range of motion.  Twelve-lead ECG shows normal sinus rhythm with normal rate, normal axis and no ST changes.  Assessment:   Healthy male     Plan:     During the course of the visit the patient was educated and counseled about appropriate screening and preventive services including:   Screening electrocardiogram  Patient Instructions (the written plan) was given to the patient.

## 2015-01-29 NOTE — Assessment & Plan Note (Signed)
Pain resolved, triggering is still mild tolerable.

## 2015-01-30 LAB — HIV ANTIBODY (ROUTINE TESTING W REFLEX): HIV 1&2 Ab, 4th Generation: NONREACTIVE

## 2015-01-30 LAB — HEPATITIS C ANTIBODY: HCV Ab: NEGATIVE

## 2015-01-30 LAB — HEMOGLOBIN A1C
Hgb A1c MFr Bld: 6.2 % — ABNORMAL HIGH (ref <5.7)
Mean Plasma Glucose: 131 mg/dL — ABNORMAL HIGH (ref <117)

## 2015-01-30 IMAGING — CR DG CHEST 2V
2 series · 2 of 2 positions shown · non-contrast
Comparison: 05/10/2011.

CLINICAL DATA: Preop.  Hypertension.

EXAM:
CHEST  2 VIEW

[w chest pa]
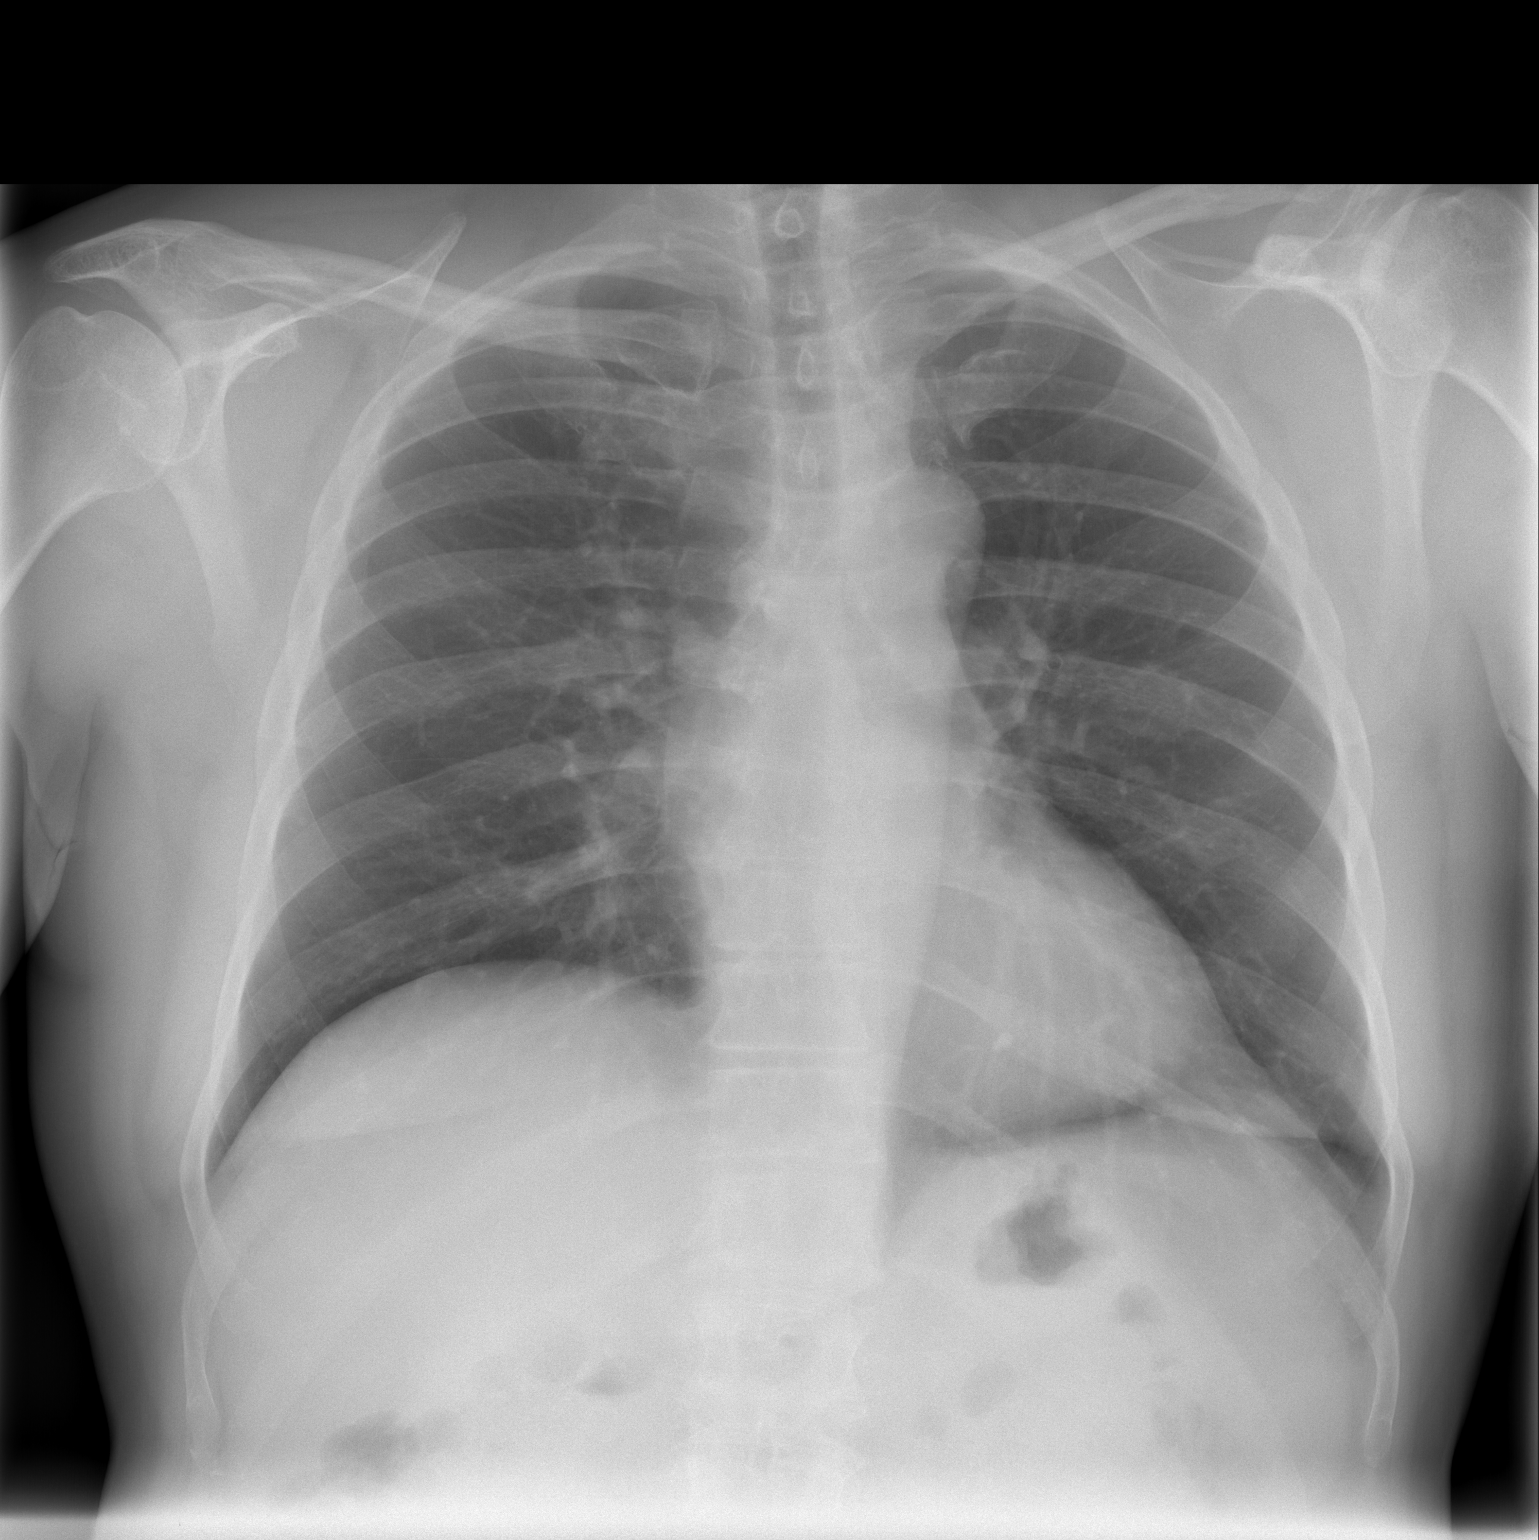

[w chest lat]
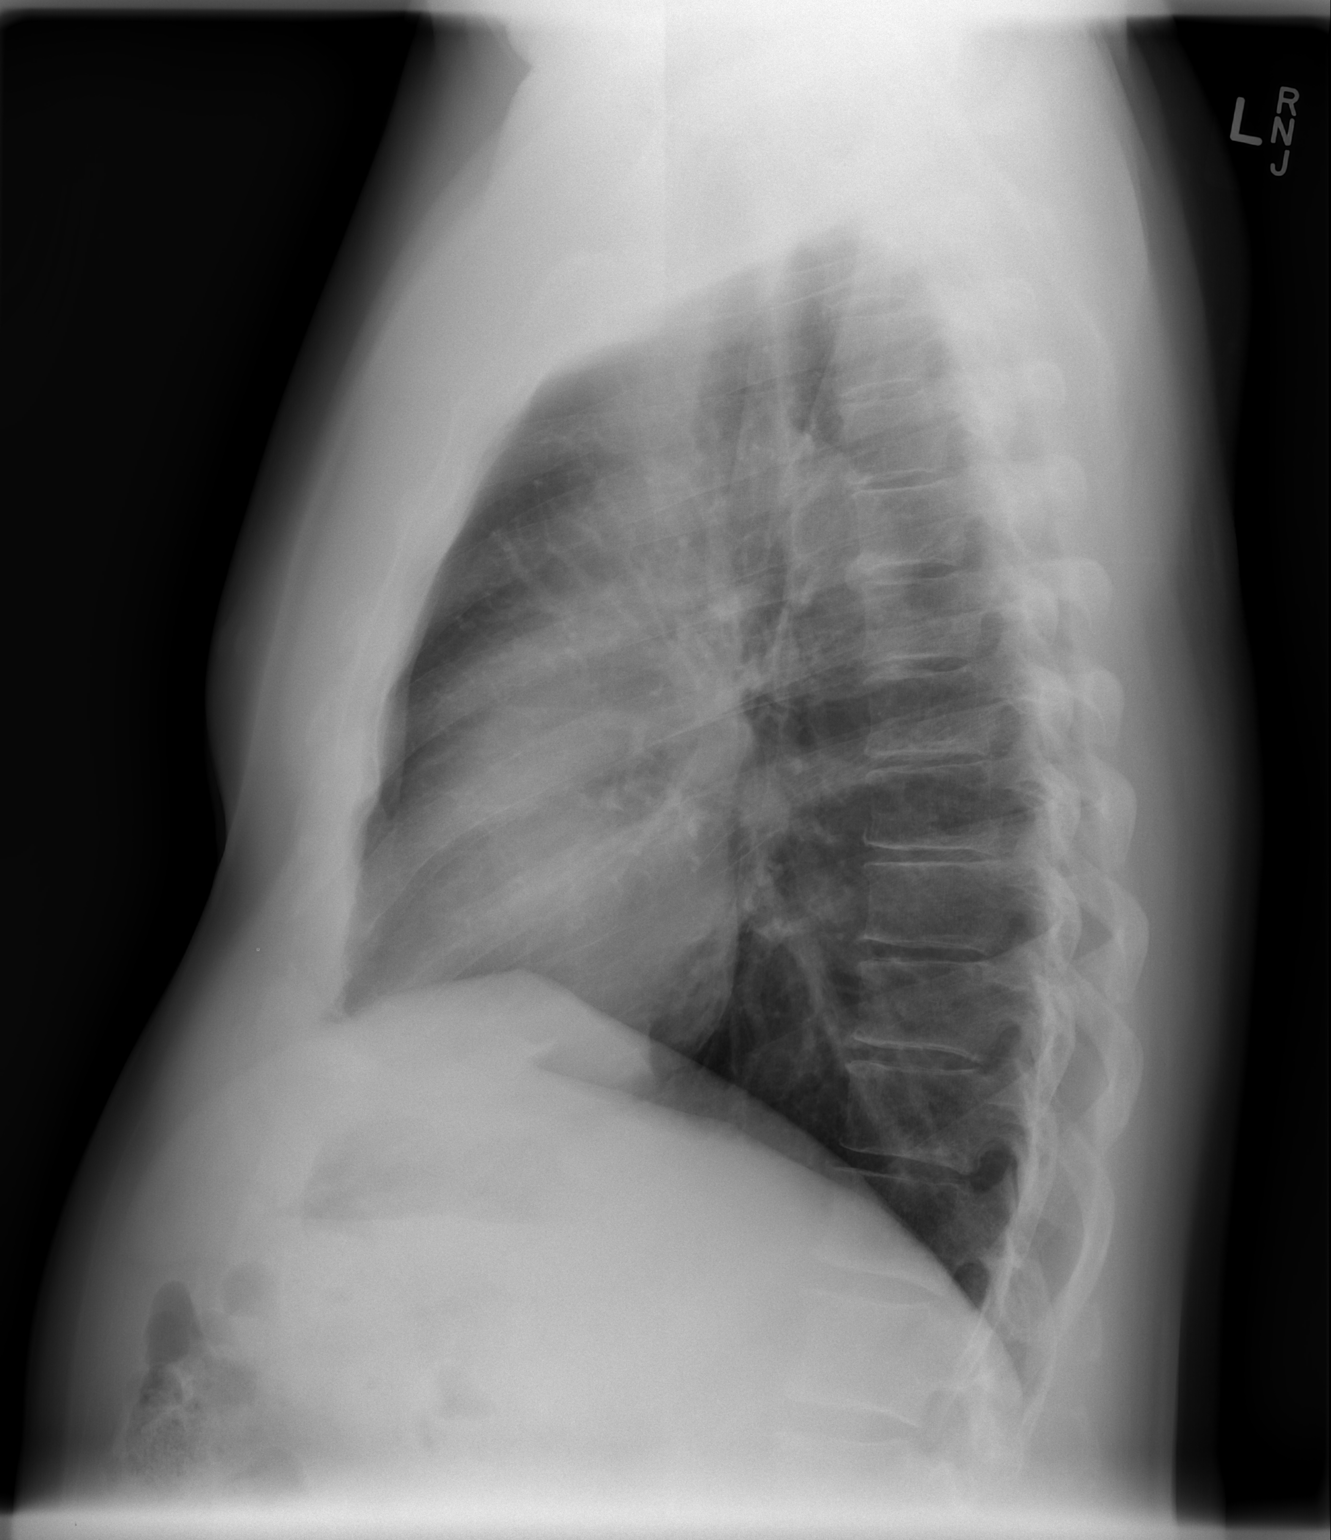

[2 of 2 positions shown; findings below may reference images not displayed]

FINDINGS: The heart size and mediastinal contours are within normal limits.
Both lungs are clear. The visualized skeletal structures are
unremarkable.
IMPRESSION: No active cardiopulmonary disease.

## 2015-02-02 ENCOUNTER — Telehealth: Payer: Self-pay

## 2015-02-02 DIAGNOSIS — H8110 Benign paroxysmal vertigo, unspecified ear: Secondary | ICD-10-CM | POA: Insufficient documentation

## 2015-02-02 DIAGNOSIS — H8113 Benign paroxysmal vertigo, bilateral: Secondary | ICD-10-CM

## 2015-02-02 MED ORDER — PREDNISONE 50 MG PO TABS: 50.0000 mg | ORAL_TABLET | Freq: Every day | ORAL | Status: DC

## 2015-02-02 MED ORDER — MECLIZINE HCL 25 MG PO TABS: 25.0000 mg | ORAL_TABLET | Freq: Three times a day (TID) | ORAL | Status: DC | PRN

## 2015-02-02 MED ORDER — SCOPOLAMINE 1 MG/3DAYS TD PT72: 1.0000 | MEDICATED_PATCH | TRANSDERMAL | Status: DC

## 2015-02-02 NOTE — Telephone Encounter (Signed)
Sounds like positional vertigo, I'm going to call in meclizine, prednisone, and scopolamine patches. Try to avoid movement of the head

## 2015-02-02 NOTE — Assessment & Plan Note (Signed)
Adding prednisone,meclizine, scopolamine patches.

## 2015-02-02 NOTE — Telephone Encounter (Signed)
Pt left VM stating having severe dizziness with most movements and would like to know if there is something we can do to help him. Please advise.

## 2015-02-03 MED ORDER — SCOPOLAMINE 1 MG/3DAYS TD PT72: 1.0000 | MEDICATED_PATCH | TRANSDERMAL | Status: DC

## 2015-02-03 MED ORDER — MECLIZINE HCL 25 MG PO TABS: 25.0000 mg | ORAL_TABLET | Freq: Three times a day (TID) | ORAL | Status: DC | PRN

## 2015-02-03 MED ORDER — PREDNISONE 50 MG PO TABS: 50.0000 mg | ORAL_TABLET | Freq: Every day | ORAL | Status: DC

## 2015-02-03 NOTE — Telephone Encounter (Signed)
Pt informed of information, medications and use. No questions at this time.

## 2015-02-05 NOTE — Addendum Note (Signed)
Addended by: Huel Cote on: 02/05/2015 10:11 AM   Modules accepted: Orders

## 2015-02-13 ENCOUNTER — Ambulatory Visit (INDEPENDENT_AMBULATORY_CARE_PROVIDER_SITE_OTHER): Payer: PRIVATE HEALTH INSURANCE | Admitting: Sports Medicine

## 2015-02-13 VITALS — BP 146/82 | HR 92 | Resp 18 | Wt 164.0 lb

## 2015-02-13 DIAGNOSIS — K5730 Diverticulosis of large intestine without perforation or abscess without bleeding: Secondary | ICD-10-CM | POA: Diagnosis not present

## 2015-02-13 DIAGNOSIS — K5790 Diverticulosis of intestine, part unspecified, without perforation or abscess without bleeding: Secondary | ICD-10-CM | POA: Insufficient documentation

## 2015-02-13 MED ORDER — CIPROFLOXACIN HCL 750 MG PO TABS: 750.0000 mg | ORAL_TABLET | Freq: Two times a day (BID) | ORAL | Status: DC

## 2015-02-13 MED ORDER — METRONIDAZOLE 500 MG PO TABS: 500.0000 mg | ORAL_TABLET | Freq: Two times a day (BID) | ORAL | Status: DC

## 2015-02-13 NOTE — Progress Notes (Signed)
  Subjective:    CC: Acute abdominal pain  HPI: This is a pleasant 65 year male with a history of sigmoid colectomy for diverticulitis. Every now and then he gets a flare, and his flares in the left lower quadrant, with moderate pain, no nausea, vomiting, diarrhea, no hematochezia, no melena. Typically this resolved with Cipro and Flagyl. Low-grade fevers at home.  Past medical history, Surgical history, Family history not pertinant except as noted below, Social history, Allergies, and medications have been entered into the medical record, reviewed, and no changes needed.   Review of Systems: No fevers, chills, night sweats, weight loss, chest pain, or shortness of breath.   Objective:    General: Well Developed, well nourished, and in no acute distress.  Neuro: Alert and oriented x3, extra-ocular muscles intact, sensation grossly intact.  HEENT: Normocephalic, atraumatic, pupils equal round reactive to light, neck supple, no masses, no lymphadenopathy, thyroid nonpalpable.  Skin: Warm and dry, no rashes. Cardiac: Regular rate and rhythm, no murmurs rubs or gallops, no lower extremity edema.  Respiratory: Clear to auscultation bilaterally. Not using accessory muscles, speaking in full sentences. Abdomen: Soft, minimally tender in the left lower quadrant, no guarding, no rigidity, no rebound tenderness, no palpable masses. Normal bowel sounds.  Impression and Recommendations:

## 2015-02-13 NOTE — Patient Instructions (Signed)
Diverticulitis °Diverticulitis is inflammation or infection of small pouches in your colon that form when you have a condition called diverticulosis. The pouches in your colon are called diverticula. Your colon, or large intestine, is where water is absorbed and stool is formed. °Complications of diverticulitis can include: °· Bleeding. °· Severe infection. °· Severe pain. °· Perforation of your colon. °· Obstruction of your colon. °CAUSES  °Diverticulitis is caused by bacteria. °Diverticulitis happens when stool becomes trapped in diverticula. This allows bacteria to grow in the diverticula, which can lead to inflammation and infection. °RISK FACTORS °People with diverticulosis are at risk for diverticulitis. Eating a diet that does not include enough fiber from fruits and vegetables may make diverticulitis more likely to develop. °SYMPTOMS  °Symptoms of diverticulitis may include: °· Abdominal pain and tenderness. The pain is normally located on the left side of the abdomen, but may occur in other areas. °· Fever and chills. °· Bloating. °· Cramping. °· Nausea. °· Vomiting. °· Constipation. °· Diarrhea. °· Blood in your stool. °DIAGNOSIS  °Your health care provider will ask you about your medical history and do a physical exam. You may need to have tests done because many medical conditions can cause the same symptoms as diverticulitis. Tests may include: °· Blood tests. °· Urine tests. °· Imaging tests of the abdomen, including X-rays and CT scans. °When your condition is under control, your health care provider may recommend that you have a colonoscopy. A colonoscopy can show how severe your diverticula are and whether something else is causing your symptoms. °TREATMENT  °Most cases of diverticulitis are mild and can be treated at home. Treatment may include: °· Taking over-the-counter pain medicines. °· Following a clear liquid diet. °· Taking antibiotic medicines by mouth for 7-10 days. °More severe cases may  be treated at a hospital. Treatment may include: °· Not eating or drinking. °· Taking prescription pain medicine. °· Receiving antibiotic medicines through an IV tube. °· Receiving fluids and nutrition through an IV tube. °· Surgery. °HOME CARE INSTRUCTIONS  °· Follow your health care provider's instructions carefully. °· Follow a full liquid diet or other diet as directed by your health care provider. After your symptoms improve, your health care provider may tell you to change your diet. He or she may recommend you eat a high-fiber diet. Fruits and vegetables are good sources of fiber. Fiber makes it easier to pass stool. °· Take fiber supplements or probiotics as directed by your health care provider. °· Only take medicines as directed by your health care provider. °· Keep all your follow-up appointments. °SEEK MEDICAL CARE IF:  °· Your pain does not improve. °· You have a hard time eating food. °· Your bowel movements do not return to normal. °SEEK IMMEDIATE MEDICAL CARE IF:  °· Your pain becomes worse. °· Your symptoms do not get better. °· Your symptoms suddenly get worse. °· You have a fever. °· You have repeated vomiting. °· You have bloody or black, tarry stools. °MAKE SURE YOU:  °· Understand these instructions. °· Will watch your condition. °· Will get help right away if you are not doing well or get worse. °  °This information is not intended to replace advice given to you by your health care provider. Make sure you discuss any questions you have with your health care provider. °  °Document Released: 10/06/2004 Document Revised: 01/01/2013 Document Reviewed: 11/21/2012 °Elsevier Interactive Patient Education ©2016 [employer] ° °

## 2015-02-13 NOTE — Assessment & Plan Note (Signed)
Currently having a flare, history of sigmoidectomy. This is acute diverticulitis with moderate tenderness in the left lower quadrant. Cipro, Flagyl, clear diet, call me back if worsening over the weekend or no better by Monday and we will obtain a CT of the abdomen and pelvis with oral and IV contrast.

## 2015-03-17 ENCOUNTER — Other Ambulatory Visit: Payer: Self-pay

## 2015-03-17 ENCOUNTER — Telehealth: Payer: Self-pay

## 2015-03-17 MED ORDER — SIMVASTATIN 20 MG PO TABS: 20.0000 mg | ORAL_TABLET | Freq: Every morning | ORAL | Status: DC

## 2015-03-17 NOTE — Telephone Encounter (Signed)
Pt is on a synthetic opioid Nucynta 75mg  and would like to know if you're willing to refill this for him. Please advise.

## 2015-03-19 NOTE — Telephone Encounter (Signed)
Pt.notified

## 2015-03-19 NOTE — Telephone Encounter (Signed)
I can't do that one, needs to be through pain management/existing prescriber

## 2015-03-30 DIAGNOSIS — Z8546 Personal history of malignant neoplasm of prostate: Secondary | ICD-10-CM | POA: Diagnosis not present

## 2015-04-06 DIAGNOSIS — N5231 Erectile dysfunction following radical prostatectomy: Secondary | ICD-10-CM | POA: Diagnosis not present

## 2015-04-06 DIAGNOSIS — Z0000 Encounter for general adult medical examination without abnormal findings: Secondary | ICD-10-CM | POA: Diagnosis not present

## 2015-04-06 DIAGNOSIS — Z8546 Personal history of malignant neoplasm of prostate: Secondary | ICD-10-CM | POA: Diagnosis not present

## 2015-04-30 ENCOUNTER — Encounter: Payer: Self-pay | Admitting: Sports Medicine

## 2015-04-30 ENCOUNTER — Ambulatory Visit (INDEPENDENT_AMBULATORY_CARE_PROVIDER_SITE_OTHER): Payer: PRIVATE HEALTH INSURANCE | Admitting: Sports Medicine

## 2015-04-30 VITALS — BP 125/76 | HR 68 | Resp 18 | Wt 166.5 lb

## 2015-04-30 DIAGNOSIS — R220 Localized swelling, mass and lump, head: Secondary | ICD-10-CM

## 2015-04-30 DIAGNOSIS — I10 Essential (primary) hypertension: Secondary | ICD-10-CM

## 2015-04-30 MED ORDER — AMOXICILLIN-POT CLAVULANATE 875-125 MG PO TABS: 1.0000 | ORAL_TABLET | Freq: Two times a day (BID) | ORAL | Status: DC

## 2015-04-30 MED ORDER — PREDNISONE 50 MG PO TABS: ORAL_TABLET | ORAL | Status: DC

## 2015-04-30 NOTE — Assessment & Plan Note (Signed)
Having orthostatic hypotension, decrease valsartan to 160 mg, one half pill.

## 2015-04-30 NOTE — Assessment & Plan Note (Signed)
Suspect parotitis. Prednisone, Augmentin, return in one month, ultrasound if no better.

## 2015-04-30 NOTE — Patient Instructions (Signed)
Parotitis Parotitis is soreness and inflammation of one or both parotid glands. The parotid glands produce saliva. They are located on each side of the face, below and in front of the earlobes. The saliva produced comes out of tiny openings (ducts) inside the cheeks. In most cases, parotitis goes away over time or with treatment. If your parotitis is caused by certain long-term (chronic) diseases, it may come back again.  CAUSES  Parotitis can be caused by:  Viral infections. Mumps is one viral infection that can cause parotitis.  Bacterial infections.  Blockage of the salivary ducts due to a salivary stone.  Narrowing of the salivary ducts.  Swelling of the salivary ducts.  Dehydration.  Autoimmune conditions, such as sarcoidosis or Sjogren syndrome.  Air from activities such as scuba diving, glass blowing, or playing an instrument (rare).  Human immunodeficiency virus (HIV) or acquired immunodeficiency syndrome (AIDS).  Tuberculosis. SIGNS AND SYMPTOMS   The ears may appear to be pushed up and out from their normal position.  Redness (erythema) of the skin over the parotid glands.  Pain and tenderness over the parotid glands.  Swelling in the parotid gland area.  Yellowish-white fluid (pus) coming from the ducts inside the cheeks.  Dry mouth.  Bad taste in the mouth. DIAGNOSIS  Your health care provider may determine that you have parotitis based on your symptoms and a physical exam. A sample of fluid may also be taken from the parotid gland and tested to find the cause of your infection. X-rays or computed tomography (CT) scans may be taken if your health care provider thinks you might have a salivary stone blocking your salivary duct. TREATMENT  Treatment varies depending upon the cause of your parotitis. If your parotitis is caused by mumps, no treatment is needed. The condition will go away on its own after 7 to 10 days. In other cases, treatment may  include:  Antibiotic medicine if your infection was caused by bacteria.  Pain medicines.  Gland massage.  Eating sour candy to increase your saliva production.  Removal of salivary stones. Your health care provider may flush stones out with fluids or remove them with tweezers.  Surgery to remove the parotid glands. HOME CARE INSTRUCTIONS   If you were prescribed an antibiotic medicine, finish it all even if you start to feel better.  Put warm compresses on the sore area.  Take medicines only as directed by your health care provider.  Drink enough fluids to keep your urine clear or pale yellow. SEEK IMMEDIATE MEDICAL CARE IF:   You have increasing pain or swelling that is not controlled with medicine.  You have a fever. MAKE SURE YOU:  Understand these instructions.  Will watch your condition.  Will get help right away if you are not doing well or get worse.   This information is not intended to replace advice given to you by your health care provider. Make sure you discuss any questions you have with your health care provider.   Document Released: 06/18/2001 Document Revised: 01/17/2014 Document Reviewed: 05/22/2014 Elsevier Interactive Patient Education 2016 [employer]  

## 2015-04-30 NOTE — Progress Notes (Signed)
  Subjective:    CC: Lump on left neck   HPI: Patient is here today for a swollen lump below his left jaw x1 week. Patient states that the area is not painful, but somewhat tender to touch. He had left ear pain when the lump first appeared, but that has resolved. Patient has not been sick recently. He denies fever, chills, body aches, night sweats, unexplained weight loss. He does report some dry month and dry eyes. He has a history of dry eyes but attributes this to allergies.   Patient also reports some ongoing dizziness for the past several months. The dizziness has improved a little since it first began, but is present daily. The dizziness occurs more frequently when he bends over and then stands up. Patient states his eyes "go black" and he feels like he is going to faint. He denies blurry vision, double vision, chest pain, shortness of breath, and palpitations. He has not started any new medications but does take a daily medication for hypertension.    Past medical history, Surgical history, Family history not pertinant except as noted below, Social history, Allergies, and medications have been entered into the medical record, reviewed, and no changes needed.   Review of Systems: No fevers, chills, night sweats, weight loss, chest pain, or shortness of breath.   Objective:    General: Well Developed, well nourished, and in no acute distress.  Neuro: Alert and oriented x3, extra-ocular muscles intact, sensation grossly intact.  HEENT: Normocephalic, atraumatic, pupils equal round reactive to light, neck supple, no lymphadenopathy, thyroid nonpalpable. Slight fullness on the left side of the neck near the parotid gland with minimal tenderness Skin: Warm and dry, no rashes. Cardiac: Regular rate and rhythm, no murmurs rubs or gallops, no lower extremity edema.  Respiratory: Clear to auscultation bilaterally. Not using accessory muscles, speaking in full sentences.  Impression and  Recommendations:

## 2015-05-14 ENCOUNTER — Telehealth: Payer: Self-pay

## 2015-05-14 DIAGNOSIS — R220 Localized swelling, mass and lump, head: Secondary | ICD-10-CM

## 2015-05-14 NOTE — Telephone Encounter (Signed)
Pt left VM stating antibiotin and prednisone did not work on salivary gland. Would like to know if he needs to be seen sooner than the 18th. Also would like to know if you would like to scan him because he will have to have that approved through insurance. Please advise.

## 2015-05-14 NOTE — Telephone Encounter (Signed)
Yes, ultrasound ordered, I'm also going to do a referral to ENT.

## 2015-05-14 NOTE — Telephone Encounter (Signed)
Left a detailed message with information and told to call back with any questions.

## 2015-05-21 ENCOUNTER — Ambulatory Visit (INDEPENDENT_AMBULATORY_CARE_PROVIDER_SITE_OTHER): Payer: PRIVATE HEALTH INSURANCE

## 2015-05-21 DIAGNOSIS — E042 Nontoxic multinodular goiter: Secondary | ICD-10-CM | POA: Diagnosis not present

## 2015-05-21 DIAGNOSIS — R220 Localized swelling, mass and lump, head: Secondary | ICD-10-CM

## 2015-05-28 ENCOUNTER — Ambulatory Visit: Payer: Self-pay | Admitting: Sports Medicine

## 2015-06-22 DIAGNOSIS — M545 Low back pain: Secondary | ICD-10-CM | POA: Diagnosis not present

## 2015-06-22 DIAGNOSIS — M5137 Other intervertebral disc degeneration, lumbosacral region: Secondary | ICD-10-CM | POA: Diagnosis not present

## 2015-06-22 DIAGNOSIS — M5432 Sciatica, left side: Secondary | ICD-10-CM | POA: Diagnosis not present

## 2015-06-22 DIAGNOSIS — M961 Postlaminectomy syndrome, not elsewhere classified: Secondary | ICD-10-CM | POA: Diagnosis not present

## 2015-06-24 ENCOUNTER — Telehealth: Payer: Self-pay

## 2015-06-24 ENCOUNTER — Other Ambulatory Visit: Payer: Self-pay

## 2015-06-24 DIAGNOSIS — I10 Essential (primary) hypertension: Secondary | ICD-10-CM

## 2015-06-24 MED ORDER — VALSARTAN 320 MG PO TABS: 160.0000 mg | ORAL_TABLET | Freq: Every day | ORAL | Status: DC

## 2015-06-24 NOTE — Telephone Encounter (Signed)
Sent to pharmacy 

## 2015-06-24 NOTE — Telephone Encounter (Signed)
Yes, give him 90 pills with 3 refills

## 2015-07-22 DIAGNOSIS — M5136 Other intervertebral disc degeneration, lumbar region: Secondary | ICD-10-CM | POA: Diagnosis not present

## 2015-07-31 DIAGNOSIS — M545 Low back pain: Secondary | ICD-10-CM | POA: Diagnosis not present

## 2015-07-31 DIAGNOSIS — M961 Postlaminectomy syndrome, not elsewhere classified: Secondary | ICD-10-CM | POA: Diagnosis not present

## 2015-07-31 DIAGNOSIS — M5137 Other intervertebral disc degeneration, lumbosacral region: Secondary | ICD-10-CM | POA: Diagnosis not present

## 2015-08-11 DIAGNOSIS — M5416 Radiculopathy, lumbar region: Secondary | ICD-10-CM | POA: Diagnosis not present

## 2015-10-06 ENCOUNTER — Telehealth: Payer: Self-pay | Admitting: Sports Medicine

## 2015-10-06 MED ORDER — AMITRIPTYLINE HCL 25 MG PO TABS: 25.0000 mg | ORAL_TABLET | Freq: Every day | ORAL | 3 refills | Status: DC

## 2015-10-06 NOTE — Telephone Encounter (Signed)
Pt called to see if PCP would start writing his amitriptyline 25 mg Rx. Pt states he was getting this from pain management for some nerve damage but realized it really helped with his restless legs at night. Pain management is now discharging him due to completion of therapy and would like to continue on this Rx. Will route to PCP for review.

## 2015-10-06 NOTE — Telephone Encounter (Signed)
No problem, I called in a one-year supply

## 2015-10-06 NOTE — Telephone Encounter (Signed)
Pt advised.

## 2015-10-26 DIAGNOSIS — Z8546 Personal history of malignant neoplasm of prostate: Secondary | ICD-10-CM | POA: Diagnosis not present

## 2015-11-02 ENCOUNTER — Telehealth: Payer: Self-pay | Admitting: Sports Medicine

## 2015-11-02 DIAGNOSIS — Z8546 Personal history of malignant neoplasm of prostate: Secondary | ICD-10-CM | POA: Diagnosis not present

## 2015-11-02 DIAGNOSIS — N5231 Erectile dysfunction following radical prostatectomy: Secondary | ICD-10-CM | POA: Diagnosis not present

## 2015-11-02 MED ORDER — AMLODIPINE BESYLATE 5 MG PO TABS: 5.0000 mg | ORAL_TABLET | Freq: Every day | ORAL | 3 refills | Status: DC

## 2015-11-02 NOTE — Telephone Encounter (Signed)
[name] calls in, he has had some degree of ED after his prostate surgery, however this improved with decreasing his BP meds to 160mg  of Valsartan.  He wonders if changing to a different med will improve ED even more.  We will switch to amlodipine 5mg  daily and recheck BP in 2 weeks.  He already takes cialis.

## 2015-11-06 ENCOUNTER — Encounter: Payer: Self-pay | Admitting: Sports Medicine

## 2015-11-06 MED ORDER — AMLODIPINE BESYLATE 10 MG PO TABS: 10.0000 mg | ORAL_TABLET | Freq: Every day | ORAL | 3 refills | Status: DC

## 2015-11-12 ENCOUNTER — Encounter: Payer: Self-pay | Admitting: Sports Medicine

## 2015-11-16 ENCOUNTER — Encounter: Payer: Self-pay | Admitting: Sports Medicine

## 2015-11-16 ENCOUNTER — Ambulatory Visit (INDEPENDENT_AMBULATORY_CARE_PROVIDER_SITE_OTHER): Payer: PRIVATE HEALTH INSURANCE | Admitting: Sports Medicine

## 2015-11-16 DIAGNOSIS — N5239 Other post-surgical erectile dysfunction: Secondary | ICD-10-CM | POA: Insufficient documentation

## 2015-11-16 DIAGNOSIS — M1811 Unilateral primary osteoarthritis of first carpometacarpal joint, right hand: Secondary | ICD-10-CM | POA: Diagnosis not present

## 2015-11-16 DIAGNOSIS — I10 Essential (primary) hypertension: Secondary | ICD-10-CM | POA: Diagnosis not present

## 2015-11-16 DIAGNOSIS — M961 Postlaminectomy syndrome, not elsewhere classified: Secondary | ICD-10-CM | POA: Insufficient documentation

## 2015-11-16 DIAGNOSIS — M180 Bilateral primary osteoarthritis of first carpometacarpal joints: Secondary | ICD-10-CM | POA: Insufficient documentation

## 2015-11-16 MED ORDER — TADALAFIL 5 MG PO TABS: ORAL_TABLET | ORAL | Status: DC

## 2015-11-16 MED ORDER — IRBESARTAN 150 MG PO TABS: 150.0000 mg | ORAL_TABLET | Freq: Every day | ORAL | 3 refills | Status: DC

## 2015-11-16 NOTE — Assessment & Plan Note (Signed)
Did not tolerate the leg swelling with amlodipine. Blood pressure is now well controlled with valsartan. Initial issue was erectile dysfunction with high dose valsartan, as well as other ACE inhibitors and ARBs. We are going to try to Irbesartan.

## 2015-11-16 NOTE — Assessment & Plan Note (Signed)
NSAIDs are ineffective. Injection placed into joint, return in one month.

## 2015-11-16 NOTE — Assessment & Plan Note (Signed)
Continue with Cialis. I have recommended that he try 10 and then 20 mg, he has only been using the 5 mg pills. He will also do some research on Levitra and [place]

## 2015-11-16 NOTE — Assessment & Plan Note (Signed)
Currently being seen and managed by Dr. Nelva Bush. Recent L4 selective nerve root block was tremendously effective

## 2015-11-16 NOTE — Progress Notes (Signed)
  Subjective:    CC: Follow-up  HPI: Hypertension: Initially did well with switching to valsartan and decreasing to one half, principal issue was erectile dysfunction. We switched him to amlodipine, which resulted in intolerable ankle swelling, and did not control his blood pressure.  Erectile dysfunction: Currently uses Cialis but only takes 5 mg daily, has not tried higher dosages. He is post nerve sparing prostatectomy.  Right hand pain: Localized at the base of the thumb. Oral NSAIDs have been ineffective.  Low back pain: History of L4 laminotomy, multiple times, persistent pain, finally Dr. Nelva Bush gave him a selective L4 nerve root block which provided fantastic relief.  Past medical history:  Negative.  See flowsheet/record as well for more information.  Surgical history: Negative.  See flowsheet/record as well for more information.  Family history: Negative.  See flowsheet/record as well for more information.  Social history: Negative.  See flowsheet/record as well for more information.  Allergies, and medications have been entered into the medical record, reviewed, and no changes needed.   Review of Systems: No fevers, chills, night sweats, weight loss, chest pain, or shortness of breath.   Objective:    General: Well Developed, well nourished, and in no acute distress.  Neuro: Alert and oriented x3, extra-ocular muscles intact, sensation grossly intact.  HEENT: Normocephalic, atraumatic, pupils equal round reactive to light, neck supple, no masses, no lymphadenopathy, thyroid nonpalpable.  Skin: Warm and dry, no rashes. Cardiac: Regular rate and rhythm, no murmurs rubs or gallops, no lower extremity edema.  Respiratory: Clear to auscultation bilaterally. Not using accessory muscles, speaking in full sentences. Right hand: Tender to palpation at the trapeziometacarpal joint. Negative Finkelstein sign.  Procedure: Real-time Ultrasound Guided Injection of right trapeziometacarpal  joint Device: GE Logiq E  Verbal informed consent obtained.  Time-out conducted.  Noted no overlying erythema, induration, or other signs of local infection.  Skin prepped in a sterile fashion.  Local anesthesia: Topical Ethyl chloride.  With sterile technique and under real time ultrasound guidance:  30-gauge needle advanced into joint and 1/2 mL kenalog 40, 1/2 mL lidocaine injected easily Completed without difficulty  Pain immediately resolved suggesting accurate placement of the medication.  Advised to call if fevers/chills, erythema, induration, drainage, or persistent bleeding.  Images permanently stored and available for review in the ultrasound unit.  Impression: Technically successful ultrasound guided injection.  Impression and Recommendations:    Postlaminectomy syndrome, lumbar region Currently being seen and managed by Dr. Nelva Bush. Recent L4 selective nerve root block was tremendously effective  Osteoarthritis of first carpometacarpal joint of right hand NSAIDs are ineffective. Injection placed into joint, return in one month.  Essential hypertension, benign Did not tolerate the leg swelling with amlodipine. Blood pressure is now well controlled with valsartan. Initial issue was erectile dysfunction with high dose valsartan, as well as other ACE inhibitors and ARBs. We are going to try to Irbesartan.   Post-surgical erectile dysfunction Continue with Cialis. I have recommended that he try 10 and then 20 mg, he has only been using the 5 mg pills. He will also do some research on Levitra and [place]

## 2015-11-18 ENCOUNTER — Other Ambulatory Visit: Payer: Self-pay | Admitting: Sports Medicine

## 2015-11-19 ENCOUNTER — Encounter: Payer: Self-pay | Admitting: Sports Medicine

## 2015-11-20 ENCOUNTER — Encounter: Payer: Self-pay | Admitting: Sports Medicine

## 2015-11-23 ENCOUNTER — Encounter: Payer: Self-pay | Admitting: Sports Medicine

## 2015-11-23 DIAGNOSIS — I10 Essential (primary) hypertension: Secondary | ICD-10-CM

## 2015-11-24 MED ORDER — IRBESARTAN 300 MG PO TABS: 300.0000 mg | ORAL_TABLET | Freq: Every day | ORAL | 3 refills | Status: DC

## 2015-12-14 ENCOUNTER — Ambulatory Visit (INDEPENDENT_AMBULATORY_CARE_PROVIDER_SITE_OTHER): Payer: PRIVATE HEALTH INSURANCE | Admitting: Sports Medicine

## 2015-12-14 ENCOUNTER — Telehealth: Payer: Self-pay

## 2015-12-14 DIAGNOSIS — Z0000 Encounter for general adult medical examination without abnormal findings: Secondary | ICD-10-CM

## 2015-12-14 DIAGNOSIS — I10 Essential (primary) hypertension: Secondary | ICD-10-CM

## 2015-12-14 DIAGNOSIS — L0100 Impetigo, unspecified: Secondary | ICD-10-CM | POA: Insufficient documentation

## 2015-12-14 DIAGNOSIS — N5239 Other post-surgical erectile dysfunction: Secondary | ICD-10-CM

## 2015-12-14 DIAGNOSIS — M1811 Unilateral primary osteoarthritis of first carpometacarpal joint, right hand: Secondary | ICD-10-CM

## 2015-12-14 MED ORDER — MUPIROCIN 2 % EX OINT: TOPICAL_OINTMENT | CUTANEOUS | 3 refills | Status: DC

## 2015-12-14 MED ORDER — MUPIROCIN CALCIUM 2 % NA OINT: 1.0000 "application " | TOPICAL_OINTMENT | Freq: Two times a day (BID) | NASAL | 11 refills | Status: DC

## 2015-12-14 NOTE — Assessment & Plan Note (Signed)
Improved significantly with increasing Cialis to 10 mg, the principal issue is duration of erection rather than quality so because the issue is not developing an erection but simply duration we will add a penis ring.

## 2015-12-14 NOTE — Assessment & Plan Note (Signed)
Overall well controlled. He does get a headache when taking Cialis with his blood pressure medication, when he is going to have sex he will simply take a half of his Avapro.

## 2015-12-14 NOTE — Progress Notes (Signed)
  Subjective:    CC: Follow-up  HPI: Thumb basal joint arthritis: Resolved after injection one month ago  Erectile dysfunction: Improved with increasing to 10 mg of Cialis. His pressure ablation now is no longer quality of erection but more duration. He is agreeable to try a penis ring.   Impetigo: Left nostril, left upper lip. Minimally painful. Ran out of his mupirocin from last year, needs a refill.  Past medical history:  Negative.  See flowsheet/record as well for more information.  Surgical history: Negative.  See flowsheet/record as well for more information.  Family history: Negative.  See flowsheet/record as well for more information.  Social history: Negative.  See flowsheet/record as well for more information.  Allergies, and medications have been entered into the medical record, reviewed, and no changes needed.   Review of Systems: No fevers, chills, night sweats, weight loss, chest pain, or shortness of breath.   Objective:    General: Well Developed, well nourished, and in no acute distress.  Neuro: Alert and oriented x3, extra-ocular muscles intact, sensation grossly intact.  HEENT: Normocephalic, atraumatic, pupils equal round reactive to light, neck supple, no masses, no lymphadenopathy, thyroid nonpalpable.  Skin: Warm and dry, no rashes. Cardiac: Regular rate and rhythm, no murmurs rubs or gallops, no lower extremity edema.  Respiratory: Clear to auscultation bilaterally. Not using accessory muscles, speaking in full sentences.  Impression and Recommendations:    Osteoarthritis of first carpometacarpal joint of right hand Resolved after injection 1 month ago.  Annual physical exam Patient will return for his Medicare physical  Essential hypertension, benign Overall well controlled. He does get a headache when taking Cialis with his blood pressure medication, when he is going to have sex he will simply take a half of his Avapro.  Post-surgical erectile  dysfunction Improved significantly with increasing Cialis to 10 mg, the principal issue is duration of erection rather than quality so because the issue is not developing an erection but simply duration we will add a penis ring.  Impetigo Nasal Mupirocin

## 2015-12-14 NOTE — Patient Instructions (Signed)
Return for Medicare physical after 01/29/2016

## 2015-12-14 NOTE — Assessment & Plan Note (Signed)
Patient will return for his Medicare physical

## 2015-12-14 NOTE — Assessment & Plan Note (Signed)
Resolved after injection 1 month ago.

## 2015-12-14 NOTE — Assessment & Plan Note (Signed)
Nasal Mupirocin

## 2015-12-14 NOTE — Telephone Encounter (Signed)
Done

## 2015-12-23 ENCOUNTER — Encounter: Payer: Self-pay | Admitting: Sports Medicine

## 2015-12-31 ENCOUNTER — Encounter: Payer: Self-pay | Admitting: Sports Medicine

## 2016-01-01 ENCOUNTER — Encounter: Payer: Self-pay | Admitting: Sports Medicine

## 2016-01-01 DIAGNOSIS — F411 Generalized anxiety disorder: Secondary | ICD-10-CM | POA: Insufficient documentation

## 2016-01-01 DIAGNOSIS — F320 Major depressive disorder, single episode, mild: Secondary | ICD-10-CM | POA: Insufficient documentation

## 2016-01-01 MED ORDER — BUSPIRONE HCL 5 MG PO TABS: 5.0000 mg | ORAL_TABLET | Freq: Two times a day (BID) | ORAL | 3 refills | Status: DC

## 2016-01-22 ENCOUNTER — Other Ambulatory Visit: Payer: Self-pay

## 2016-01-22 MED ORDER — VALSARTAN 160 MG PO TABS: 160.0000 mg | ORAL_TABLET | Freq: Every day | ORAL | 2 refills | Status: DC

## 2016-01-27 ENCOUNTER — Encounter: Payer: Self-pay | Admitting: Sports Medicine

## 2016-01-29 MED ORDER — BUSPIRONE HCL 10 MG PO TABS: 10.0000 mg | ORAL_TABLET | Freq: Two times a day (BID) | ORAL | 2 refills | Status: DC

## 2016-02-09 ENCOUNTER — Encounter: Payer: Self-pay | Admitting: Sports Medicine

## 2016-02-12 ENCOUNTER — Ambulatory Visit (INDEPENDENT_AMBULATORY_CARE_PROVIDER_SITE_OTHER): Payer: PRIVATE HEALTH INSURANCE | Admitting: Sports Medicine

## 2016-02-12 VITALS — BP 138/87 | HR 70 | Resp 18 | Wt 169.1 lb

## 2016-02-12 DIAGNOSIS — Z1321 Encounter for screening for nutritional disorder: Secondary | ICD-10-CM | POA: Diagnosis not present

## 2016-02-12 DIAGNOSIS — Z0000 Encounter for general adult medical examination without abnormal findings: Secondary | ICD-10-CM

## 2016-02-12 DIAGNOSIS — Z23 Encounter for immunization: Secondary | ICD-10-CM

## 2016-02-12 DIAGNOSIS — F411 Generalized anxiety disorder: Secondary | ICD-10-CM | POA: Diagnosis not present

## 2016-02-12 DIAGNOSIS — M1811 Unilateral primary osteoarthritis of first carpometacarpal joint, right hand: Secondary | ICD-10-CM | POA: Diagnosis not present

## 2016-02-12 DIAGNOSIS — I10 Essential (primary) hypertension: Secondary | ICD-10-CM | POA: Diagnosis not present

## 2016-02-12 DIAGNOSIS — Z131 Encounter for screening for diabetes mellitus: Secondary | ICD-10-CM | POA: Diagnosis not present

## 2016-02-12 LAB — COMPREHENSIVE METABOLIC PANEL
ALT: 40 U/L (ref 9–46)
AST: 30 U/L (ref 10–35)
Albumin: 4.5 g/dL (ref 3.6–5.1)
Alkaline Phosphatase: 48 U/L (ref 40–115)
CO2: 29 mmol/L (ref 20–31)
Calcium: 9.5 mg/dL (ref 8.6–10.3)
Total Bilirubin: 0.6 mg/dL (ref 0.2–1.2)

## 2016-02-12 LAB — CBC
HCT: 48.3 % (ref 38.5–50.0)
Hemoglobin: 16.2 g/dL (ref 13.2–17.1)
MCH: 30.8 pg (ref 27.0–33.0)
MCHC: 33.5 g/dL (ref 32.0–36.0)
MCV: 91.8 fL (ref 80.0–100.0)
MPV: 9.7 fL (ref 7.5–12.5)
Platelets: 247 K/uL (ref 140–400)
RBC: 5.26 MIL/uL (ref 4.20–5.80)
RDW: 13.3 % (ref 11.0–15.0)
WBC: 6.9 10*3/uL (ref 3.8–10.8)

## 2016-02-12 LAB — COMPREHENSIVE METABOLIC PANEL WITH GFR
BUN: 15 mg/dL (ref 7–25)
Chloride: 102 mmol/L (ref 98–110)
Creat: 0.99 mg/dL (ref 0.70–1.25)
Glucose, Bld: 96 mg/dL (ref 65–99)
Potassium: 4.8 mmol/L (ref 3.5–5.3)
Sodium: 141 mmol/L (ref 135–146)
Total Protein: 7.1 g/dL (ref 6.1–8.1)

## 2016-02-12 LAB — LIPID PANEL
Cholesterol: 188 mg/dL (ref <200)
HDL: 55 mg/dL (ref >40)
LDL Cholesterol: 106 mg/dL — ABNORMAL HIGH (ref <100)
Total CHOL/HDL Ratio: 3.4 Ratio (ref <5.0)
Triglycerides: 133 mg/dL (ref <150)
VLDL: 27 mg/dL (ref <30)

## 2016-02-12 LAB — HEMOGLOBIN A1C
Hgb A1c MFr Bld: 5.9 % — ABNORMAL HIGH (ref <5.7)
Mean Plasma Glucose: 123 mg/dL

## 2016-02-12 MED ORDER — BUSPIRONE HCL 15 MG PO TABS: 15.0000 mg | ORAL_TABLET | Freq: Two times a day (BID) | ORAL | 3 refills | Status: DC

## 2016-02-12 NOTE — Progress Notes (Signed)
Subjective:   [name] is a 66 y.o. male who presents for Medicare Annual/Subsequent preventive examination.  Review of Systems:  A comprehensive review of systems was negative except as noted below       Objective:    Vitals: BP 138/87   Pulse 70   Resp 18   Wt 169 lb 1.6 oz (76.7 kg)   BMI 25.71 kg/m   Body mass index is 25.71 kg/m.  Tobacco History  Smoking Status  . Never Smoker  Smokeless Tobacco  . Never Used     Counseling given: Not Answered   Past Medical History:  Diagnosis Date  . ADD (attention deficit disorder)   . Cancer ([place])    PROSTATE  . Diverticulitis   . Hypertension   . Pain    HX OF 3 BACK SURGERIES - STILL HAS SEVERE BACK PAIN WITH PROLONGED SITTING OR STANDING  . PONV (postoperative nausea and vomiting)    NAUSEA AFTER SHOULDER SURGERY  . Reflux    MILD - NO DAILY MEDS - ANTIACID IF NEEDED   Past Surgical History:  Procedure Laterality Date  . [place], 2007  . COLON SURGERY  2008   COLON RESECTION FOR DIVERTICULITIS  . HERNIA REPAIR  1997 OR 1998   ING HERNIA REPAIR  . RIGHT SHOULDER 2013    . ROBOT ASSISTED LAPAROSCOPIC RADICAL PROSTATECTOMY N/A 02/13/2013   Procedure: ROBOTIC ASSISTED LAPAROSCOPIC RADICAL PROSTATECTOMY,, LYSIS OF PELVIC / INTRA-ABDOMINAL ADHESIONS;  Surgeon: Bernestine Amass, MD;  Location: [facility];  Service: Urology;  Laterality: N/A;   Family History  Problem Relation Age of Onset  . Heart attack Mother   . Hyperlipidemia Mother   . Hypertension Mother   . Stroke Mother   . Heart attack Father   . Hypertension Father   . Hyperlipidemia Father   . Diabetes Father    History  Sexual Activity  . Sexual activity: Yes    Outpatient Encounter Prescriptions as of 02/12/2016  Medication Sig  . amitriptyline (ELAVIL) 25 MG tablet Take 1 tablet (25 mg total) by mouth at bedtime.  Marland Kitchen aspirin 81 MG tablet Take 81 mg by mouth daily.  . cholecalciferol (VITAMIN D) 1000 units  tablet Take 1,000 Units by mouth daily.  . Multiple Vitamin (MULTI VITAMIN DAILY PO) Take by mouth.  . mupirocin ointment (BACTROBAN) 2 % Apply to affected area TID for 7 days.  . Omega-3 Fatty Acids (FISH OIL) 1200 MG CAPS Take by mouth.  . simvastatin (ZOCOR) 20 MG tablet Take 1 tablet by mouth every morning  . tadalafil (CIALIS) 5 MG tablet 10-20 mg as needed  . Tapentadol HCl (NUCYNTA) 75 MG TABS Take by mouth.  . traMADol (ULTRAM) 50 MG tablet Take by mouth every 6 (six) hours as needed.  . valsartan (DIOVAN) 160 MG tablet Take 1 tablet (160 mg total) by mouth daily.  . [DISCONTINUED] busPIRone (BUSPAR) 10 MG tablet Take 1 tablet (10 mg total) by mouth 2 (two) times daily.  . busPIRone (BUSPAR) 15 MG tablet Take 1 tablet (15 mg total) by mouth 2 (two) times daily.  . [DISCONTINUED] amLODipine (NORVASC) 10 MG tablet Take 1 tablet (10 mg total) by mouth daily. (Patient not taking: Reported on 11/16/2015)   No facility-administered encounter medications on file as of 02/12/2016.     Activities of Daily Living No flowsheet data found.  Patient Care Team: Silverio Decamp, MD  as PCP - General (Sports Medicine)   Assessment:    Healthy male Exercise Activities and Dietary recommendations    Goals    None     Fall Risk Fall Risk  01/29/2015  Falls in the past year? No   Depression Screen PHQ 2/9 Scores 01/29/2015  PHQ - 2 Score 0    Cognitive Function Appropriate  Immunization History  Administered Date(s) Administered  . Influenza-Unspecified 10/11/2014, 09/20/2015, 09/24/2015  . Pneumococcal Conjugate-13 02/12/2016  . Pneumococcal-Unspecified 10/11/2014  . Tdap 07/03/2006  . Zoster 10/07/2010   Screening Tests Health Maintenance  Topic Date Due  . PNA vac Low Risk Adult (1 of 2 - PCV13) 10/11/2015  . TETANUS/TDAP  07/02/2016  . COLONOSCOPY  01/10/2022  . INFLUENZA VACCINE  Completed  . ZOSTAVAX  Completed  . Hepatitis C Screening  Completed  General: Well  Developed, well nourished, and in no acute distress.  Neuro: Alert and oriented x3, extra-ocular muscles intact, sensation grossly intact. Cranial nerves II through XII are intact, motor, sensory, and coordinative functions are all intact. HEENT: Normocephalic, atraumatic, pupils equal round reactive to light, neck supple, no masses, no lymphadenopathy, thyroid nonpalpable. Oropharynx, nasopharynx, external ear canals are unremarkable. Skin: Warm and dry, no rashes noted.  Cardiac: Regular rate and rhythm, no murmurs rubs or gallops.  Respiratory: Clear to auscultation bilaterally. Not using accessory muscles, speaking in full sentences.  Abdominal: Soft, nontender, nondistended, positive bowel sounds, no masses, no organomegaly.  Musculoskeletal: Shoulder, elbow, wrist, hip, knee, ankle stable, and with full range of motion.  Procedure: Real-time Ultrasound Guided Injection of right trapeziometacarpal joint Device: GE Logiq E  Verbal informed consent obtained.  Time-out conducted.  Noted no overlying erythema, induration, or other signs of local infection.  Skin prepped in a sterile fashion.  Local anesthesia: Topical Ethyl chloride.  With sterile technique and under real time ultrasound guidance:  1 mL Kenalog 3, 1 mL lidocaine, 1 mL Marcaine injected easily Completed without difficulty  Pain immediately resolved suggesting accurate placement of the medication.  Advised to call if fevers/chills, erythema, induration, drainage, or persistent bleeding.  Images permanently stored and available for review in the ultrasound unit.  Impression: Technically successful ultrasound guided injection.    Plan:    During the course of the visit the patient was educated and counseled about the following appropriate screening and preventive services:   Vaccines to include Pneumoccal, Influenza, Hepatitis B, Td, Zostavax, HCV  Electrocardiogram  Cardiovascular Disease  Colorectal cancer  screening  Diabetes screening  Prostate Cancer Screening  Glaucoma screening  Nutrition counseling   Smoking cessation counseling  Patient Instructions (the written plan) was given to the patient.   Annual physical exam Medicare physical as above. Blood pressure is slightly elevated but he did have a cup of coffee. Pneumococcal 13.  Essential hypertension, benign Overall doing well, blood pressure is slightly elevated but he did have a cup of black coffee this morning.  Generalized anxiety disorder Slight improvement, increasing BuSpar to 15 mg twice a day  Osteoarthritis of first carpometacarpal joint of right hand Three-month response to previous injection, repeat right carpometacarpal injection as above.   Aundria Mems, MD  02/12/2016

## 2016-02-12 NOTE — Assessment & Plan Note (Signed)
Medicare physical as above. Blood pressure is slightly elevated but he did have a cup of coffee. Pneumococcal 13.

## 2016-02-12 NOTE — Assessment & Plan Note (Signed)
Three-month response to previous injection, repeat right carpometacarpal injection as above.

## 2016-02-12 NOTE — Assessment & Plan Note (Signed)
Overall doing well, blood pressure is slightly elevated but he did have a cup of black coffee this morning.

## 2016-02-12 NOTE — Assessment & Plan Note (Signed)
Slight improvement, increasing BuSpar to 15 mg twice a day

## 2016-02-13 LAB — VITAMIN D 25 HYDROXY (VIT D DEFICIENCY, FRACTURES): Vit D, 25-Hydroxy: 50 ng/mL (ref 30–100)

## 2016-02-18 ENCOUNTER — Encounter: Payer: Self-pay | Admitting: Sports Medicine

## 2016-02-26 ENCOUNTER — Encounter: Payer: Self-pay | Admitting: Sports Medicine

## 2016-03-01 ENCOUNTER — Encounter: Payer: Self-pay | Admitting: Sports Medicine

## 2016-03-01 ENCOUNTER — Ambulatory Visit (INDEPENDENT_AMBULATORY_CARE_PROVIDER_SITE_OTHER): Payer: PRIVATE HEALTH INSURANCE | Admitting: Sports Medicine

## 2016-03-01 DIAGNOSIS — H109 Unspecified conjunctivitis: Secondary | ICD-10-CM | POA: Diagnosis not present

## 2016-03-01 MED ORDER — ERYTHROMYCIN 5 MG/GM OP OINT: 1.0000 "application " | TOPICAL_OINTMENT | Freq: Three times a day (TID) | OPHTHALMIC | 0 refills | Status: AC

## 2016-03-01 NOTE — Assessment & Plan Note (Signed)
The viral, adding the typical erythromycin bacterial prophylaxis. He did have exposure to influenza but no symptoms. Return as needed. If no better in one we will consider referral to ophthalmology.

## 2016-03-01 NOTE — Progress Notes (Signed)
  Subjective:    CC: Pink eye  HPI: For the past several days this pleasant 66 year old male has had increasing itchy, scratchy sensations, with discomfort and swelling in his left eye, now starting in the right eye. Vision is grossly normal, no slight runny nose but no other upper respiratory symptoms, no muscle aches, body aches. Eye has turned mildly red on the left, right eye is following.  Past medical history:  Negative.  See flowsheet/record as well for more information.  Surgical history: Negative.  See flowsheet/record as well for more information.  Family history: Negative.  See flowsheet/record as well for more information.  Social history: Negative.  See flowsheet/record as well for more information.  Allergies, and medications have been entered into the medical record, reviewed, and no changes needed.   Review of Systems: No fevers, chills, night sweats, weight loss, chest pain, or shortness of breath.   Objective:    General: Well Developed, well nourished, and in no acute distress.  Neuro: Alert and oriented x3, extra-ocular muscles intact, sensation grossly intact.  HEENT: Normocephalic, atraumatic, pupils equal round reactive to light, neck supple, no masses, no lymphadenopathy, thyroid nonpalpable. Mild left conjunctival injection with some swelling of the lids, no proptosis, no anterior chamber chemosis, no hyphema, vision grossly normal. No cervical lymphadenopathy. No tenderness over the medial canthus on either side to suggest dacryocystitis. Skin: Warm and dry, no rashes. Cardiac: Regular rate and rhythm, no murmurs rubs or gallops, no lower extremity edema.  Respiratory: Clear to auscultation bilaterally. Not using accessory muscles, speaking in full sentences.  Impression and Recommendations:    Conjunctivitis The viral, adding the typical erythromycin bacterial prophylaxis. He did have exposure to influenza but no symptoms. Return as needed. If no better in one  we will consider referral to ophthalmology.  I spent 25 minutes with this patient, greater than 50% was face-to-face time counseling regarding the above diagnoses

## 2016-03-01 NOTE — Patient Instructions (Signed)
Viral Conjunctivitis, Adult Viral conjunctivitis is an inflammation of the clear membrane that covers the white part of your eye and the inner surface of your eyelid (conjunctiva). The inflammation is caused by a viral infection. The blood vessels in the conjunctiva become inflamed, causing the eye to become red or pink, and often itchy. Viral conjunctivitis can be easily passed from one person to another (is contagious). This condition is often called pink eye. What are the causes? This condition is caused by a virus. A virus is a type of contagious germ. It can be spread by touching objects that have been contaminated with the virus, such as doorknobs or towels. It can also be passed through droplets, such as from coughing or sneezing. What are the signs or symptoms? Symptoms of this condition include:  Eye redness.  Tearing or watery eyes.  Itchy and irritated eyes.  Burning feeling in the eyes.  Clear drainage from the eye.  Swollen eyelids.  A gritty feeling in the eye.  Light sensitivity. This condition often occurs with other symptoms, such as a fever, nausea, or a rash. How is this diagnosed? This condition is diagnosed with a medical history and physical exam. If you have discharge from your eye, the discharge may be tested to rule out other causes of conjunctivitis. How is this treated? Viral conjunctivitis does not respond to medicines that kill bacteria (antibiotics). Treatment for viral conjunctivitis is directed at stopping a bacterial infection from developing in addition to the viral infection. Treatment also aims to relieve your symptoms, such as itching. This may be done with antihistamine drops or other eye medicines. Rarely, steroid eye drops or antiviral medicines may be prescribed. Follow these instructions at home: Medicines    Take or apply over-the-counter and prescription medicines only as told by your health care provider.  Be very careful to avoid touching  the edge of the eyelid with the eye drop bottle or ointment tube when applying medicines to the affected eye. Being careful this way will stop you from spreading the infection to the other eye or to other people. Eye care   Avoid touching or rubbing your eyes.  Apply a warm, wet, clean washcloth to your eye for 10-20 minutes, 3-4 times per day or as told by your health care provider.  If you wear contact lenses, do not wear them until the inflammation is gone and your health care provider says it is safe to wear them again. Ask your health care provider how to sterilize or replace your contact lenses before using them again. Wear glasses until you can resume wearing contacts.  Avoid wearing eye makeup until the inflammation is gone. Throw away any old eye cosmetics that may be contaminated.  Gently wipe away any drainage from your eye with a warm, wet washcloth or a cotton ball. General instructions   Change or wash your pillowcase every day or as told by your health care provider.  Do not share towels, pillowcases, washcloths, eye makeup, makeup brushes, contact lenses, or glasses. This may spread the infection.  Wash your hands often with soap and water. Use paper towels to dry your hands. If soap and water are not available, use hand sanitizer.  Try to avoid contact with other people for one week or as told by your health care provider. Contact a health care provider if:  Your symptoms do not improve with treatment or they get worse.  You have increased pain.  Your vision becomes blurry.  You   have a fever.  You have facial pain, redness, or swelling.  You have yellow or green drainage coming from your eye.  You have new symptoms. This information is not intended to replace advice given to you by your health care provider. Make sure you discuss any questions you have with your health care provider. Document Released: 03/19/2002 Document Revised: 07/25/2015 Document Reviewed:  07/14/2015 Elsevier Interactive Patient Education  2017 [employer]  

## 2016-03-06 ENCOUNTER — Encounter: Payer: Self-pay | Admitting: Sports Medicine

## 2016-03-06 DIAGNOSIS — H109 Unspecified conjunctivitis: Secondary | ICD-10-CM

## 2016-03-07 ENCOUNTER — Encounter: Payer: Self-pay | Admitting: Sports Medicine

## 2016-03-09 ENCOUNTER — Encounter: Payer: Self-pay | Admitting: Sports Medicine

## 2016-03-09 DIAGNOSIS — M5137 Other intervertebral disc degeneration, lumbosacral region: Secondary | ICD-10-CM | POA: Diagnosis not present

## 2016-03-10 MED ORDER — VORTIOXETINE HBR 10 MG PO TABS: 1.0000 | ORAL_TABLET | Freq: Every day | ORAL | 3 refills | Status: DC

## 2016-03-11 ENCOUNTER — Encounter: Payer: Self-pay | Admitting: Sports Medicine

## 2016-03-29 ENCOUNTER — Encounter: Payer: Self-pay | Admitting: Sports Medicine

## 2016-03-29 ENCOUNTER — Encounter: Payer: Self-pay | Admitting: Gastroenterology

## 2016-03-29 DIAGNOSIS — K5792 Diverticulitis of intestine, part unspecified, without perforation or abscess without bleeding: Secondary | ICD-10-CM

## 2016-03-29 MED ORDER — METRONIDAZOLE 500 MG PO TABS: 500.0000 mg | ORAL_TABLET | Freq: Two times a day (BID) | ORAL | 0 refills | Status: AC

## 2016-03-29 MED ORDER — CIPROFLOXACIN HCL 750 MG PO TABS: 750.0000 mg | ORAL_TABLET | Freq: Two times a day (BID) | ORAL | 0 refills | Status: AC

## 2016-04-08 ENCOUNTER — Encounter: Payer: Self-pay | Admitting: Sports Medicine

## 2016-04-11 MED ORDER — METRONIDAZOLE 500 MG PO TABS: 500.0000 mg | ORAL_TABLET | Freq: Two times a day (BID) | ORAL | 0 refills | Status: AC

## 2016-04-11 MED ORDER — CIPROFLOXACIN HCL 750 MG PO TABS: 750.0000 mg | ORAL_TABLET | Freq: Two times a day (BID) | ORAL | 0 refills | Status: AC

## 2016-04-27 DIAGNOSIS — C61 Malignant neoplasm of prostate: Secondary | ICD-10-CM | POA: Diagnosis not present

## 2016-05-02 ENCOUNTER — Ambulatory Visit (INDEPENDENT_AMBULATORY_CARE_PROVIDER_SITE_OTHER): Payer: PPO | Admitting: Gastroenterology

## 2016-05-02 ENCOUNTER — Encounter: Payer: Self-pay | Admitting: Gastroenterology

## 2016-05-02 ENCOUNTER — Encounter: Payer: Self-pay | Admitting: Sports Medicine

## 2016-05-02 VITALS — BP 132/80 | HR 96 | Ht 66.25 in | Wt 167.1 lb

## 2016-05-02 DIAGNOSIS — K5730 Diverticulosis of large intestine without perforation or abscess without bleeding: Secondary | ICD-10-CM

## 2016-05-02 DIAGNOSIS — R194 Change in bowel habit: Secondary | ICD-10-CM | POA: Diagnosis not present

## 2016-05-02 MED ORDER — NA SULFATE-K SULFATE-MG SULF 17.5-3.13-1.6 GM/177ML PO SOLN: 1.0000 | Freq: Once | ORAL | 0 refills | Status: AC

## 2016-05-02 NOTE — Patient Instructions (Addendum)
If you are age 67 or older, your body mass index should be between 23-30. Your Body mass index is 26.77 kg/m. If this is out of the aforementioned range listed, please consider follow up with your Primary Care Provider.  If you are age 29 or younger, your body mass index should be between 19-25. Your Body mass index is 26.77 kg/m. If this is out of the aformentioned range listed, please consider follow up with your Primary Care Provider.   We have sent the following medications to your pharmacy for you to pick up at your convenience:  [place] have been scheduled for a colonoscopy. Please follow written instructions given to you at your visit today.  Please pick up your prep supplies at the pharmacy within the next 1-3 days. If you use inhalers (even only as needed), please bring them with you on the day of your procedure. Your physician has requested that you go to www.[email or web address].com and enter the access code given to you at your visit today. This web site gives a general overview about your procedure. However, you should still follow specific instructions given to you by our office regarding your preparation for the procedure.  Please use Miralax as instructed.  Thank you.

## 2016-05-02 NOTE — Progress Notes (Signed)
HPI :  66 y/o male with a history of diverticulitis with colonic resection in 2008, as well as a history of prostate cancer and multiple back surgeries, presenting for a new patient evaluation for recurrent suspected diverticulitis.   He states he had a recurrece of symptoms in February 2018, acute LLQ pain, given antibiotics which resolved his symptoms within 3 days. He reports a prior sigmoid surgery in 2008 for recurrent diverticulitis at the time. He thinks he has had 3-4 episodes of diverticulitis since the surgery. CT scan in 2009 showed descending colon diverticulitis. His symptoms typically include in the LLQ pain, no fevers, and worsening constipation. He was given antibiotics and made his symptoms resolve.   Generally he is feeling better but has had bowel habit changes recently. He has had worsening of straining and constipation for 2-3 months. No blood in the stools. Taking colace and magnesium PRN. No abdominal pains. He takes metamucil every night.  No FH of colon cancer.   Colonoscopy 08/04/11 - diverticulosis descending colon, surgical anastomosis noted in left colon   Past Medical History:  Diagnosis Date  . ADD (attention deficit disorder)   . Cancer ([place])    PROSTATE  . Diverticulitis   . Hypertension   . Pain    HX OF 3 BACK SURGERIES - STILL HAS SEVERE BACK PAIN WITH PROLONGED SITTING OR STANDING  . PONV (postoperative nausea and vomiting)    NAUSEA AFTER SHOULDER SURGERY  . Reflux    MILD - NO DAILY MEDS - ANTIACID IF NEEDED     Past Surgical History:  Procedure Laterality Date  . [place], 2007  . COLON SURGERY  2008   COLON RESECTION FOR DIVERTICULITIS  . HERNIA REPAIR  1997 OR 1998   ING HERNIA REPAIR  . RIGHT SHOULDER 2013    . ROBOT ASSISTED LAPAROSCOPIC RADICAL PROSTATECTOMY N/A 02/13/2013   Procedure: ROBOTIC ASSISTED LAPAROSCOPIC RADICAL PROSTATECTOMY,, LYSIS OF PELVIC / INTRA-ABDOMINAL ADHESIONS;  Surgeon: Bernestine Amass,  MD;  Location: [facility];  Service: Urology;  Laterality: N/A;   Family History  Problem Relation Age of Onset  . Heart attack Mother   . Hyperlipidemia Mother   . Hypertension Mother   . Stroke Mother   . Heart attack Father   . Hypertension Father   . Hyperlipidemia Father   . Diabetes Father    Social History  Substance Use Topics  . Smoking status: Never Smoker  . Smokeless tobacco: Never Used  . Alcohol use Yes     Comment: SOCIAL   Current Outpatient Prescriptions  Medication Sig Dispense Refill  . amitriptyline (ELAVIL) 25 MG tablet Take 1 tablet (25 mg total) by mouth at bedtime. 90 tablet 3  . aspirin 81 MG tablet Take 81 mg by mouth daily.    . cholecalciferol (VITAMIN D) 1000 units tablet Take 1,000 Units by mouth daily.    . Coenzyme Q10 (EQL COQ10) 300 MG CAPS Take 300 mg by mouth daily.    . Magnesium 400 MG TABS Take 400 mg by mouth every other day.    . Multiple Vitamin (MULTI VITAMIN DAILY PO) Take by mouth.    . Omega-3 Fatty Acids (FISH OIL) 1200 MG CAPS Take by mouth.    . Psyllium (METAMUCIL FREE & NATURAL) 43 % POWD Take 1 scoop by mouth daily.    . simvastatin (ZOCOR) 20 MG tablet Take 1 tablet by mouth every morning 90 tablet 1  .  TURMERIC PO Take 800 mg by mouth daily.    . valsartan (DIOVAN) 160 MG tablet Take 1 tablet (160 mg total) by mouth daily. 90 tablet 2   No current facility-administered medications for this visit.    No Known Allergies   Review of Systems: All systems reviewed and negative except where noted in HPI.   Lab Results  Component Value Date   WBC 6.9 02/12/2016   HGB 16.2 02/12/2016   HCT 48.3 02/12/2016   MCV 91.8 02/12/2016   PLT 247 02/12/2016    Lab Results  Component Value Date   CREATININE 0.99 02/12/2016   BUN 15 02/12/2016   NA 141 02/12/2016   K 4.8 02/12/2016   CL 102 02/12/2016   CO2 29 02/12/2016    Lab Results  Component Value Date   ALT 40 02/12/2016   AST 30 02/12/2016   ALKPHOS 48 02/12/2016    BILITOT 0.6 02/12/2016     Physical Exam: BP 132/80   Pulse 96   Ht 5' 6.25" (1.683 m) Comment: height measured without shoes  Wt 167 lb 2 oz (75.8 kg)   BMI 26.77 kg/m  Constitutional: Pleasant,well-developed, male in no acute distress. HEENT: Normocephalic and atraumatic. Conjunctivae are normal. No scleral icterus. Neck supple.  Cardiovascular: Normal rate, regular rhythm.  Pulmonary/chest: Effort normal and breath sounds normal. No wheezing, rales or rhonchi. Abdominal: Soft, nondistended, nontender.  There are no masses palpable. No hepatomegaly. Extremities: no edema Lymphadenopathy: No cervical adenopathy noted. Neurological: Alert and oriented to person place and time. Skin: Skin is warm and dry. No rashes noted. Psychiatric: Normal mood and affect. Behavior is normal.   ASSESSMENT AND PLAN: 66 year old male with a history of recurrent diverticulitis status post sigmoid resection, here for new patient evaluation for recurrent left lower quadrant pain and recent change in bowel habits.  He has responded to antibiotics in the past as treatment for presumed recurrent diverticulitis, which is the suspected diagnosis in regards to etiology for his recurrent left lower quadrant pain. CT scan in 2009 showed descending colon diverticulitis. Other possibility includes symptomatic uncomplicated diverticular disease (SUDD) or anastomotic stricture. His last colonoscopy was 5 years ago, and given his recurrent symptoms and recent bowel habit changes I offered him a colonoscopy to ensure no interval development of polyp/mass lesion, and assess for diverticular related stricturing. Following discussion of risks and benefits of endoscopy and anesthesia, he wished to proceed with colonoscopy. In the interim, given his worsening of constipation despite daily fiber supplementation, we'll switch him to MiraLAX twice daily and he can titrate as needed to keep stools soft. Moving forward, if his  symptoms are thought to be due to recurrent diverticulitis, if symptoms continue to recur he may wish to consider additional colonic resection, versus antibiotics as needed pending no significant complications from diverticulitis. A CT scan at the time of pain would help clarify if he truly is having diverticulitis versus symptomatic diverticular disease. He will call if symptoms recur to order this for him. Further recommendations pending results of colonoscopy.   Minneola Cellar, MD [facility] Pager [phone]  CC: Silverio Decamp,*

## 2016-05-04 DIAGNOSIS — Z8546 Personal history of malignant neoplasm of prostate: Secondary | ICD-10-CM | POA: Diagnosis not present

## 2016-05-04 DIAGNOSIS — N5231 Erectile dysfunction following radical prostatectomy: Secondary | ICD-10-CM | POA: Diagnosis not present

## 2016-05-26 ENCOUNTER — Encounter: Payer: Self-pay | Admitting: Gastroenterology

## 2016-05-26 ENCOUNTER — Encounter: Payer: Self-pay | Admitting: Sports Medicine

## 2016-05-26 ENCOUNTER — Ambulatory Visit (INDEPENDENT_AMBULATORY_CARE_PROVIDER_SITE_OTHER): Payer: PPO | Admitting: Sports Medicine

## 2016-05-26 DIAGNOSIS — M1811 Unilateral primary osteoarthritis of first carpometacarpal joint, right hand: Secondary | ICD-10-CM

## 2016-05-26 NOTE — Progress Notes (Signed)
  Subjective:    CC: Right hand pain  HPI: [name] is a pleasant 66 year old male, he has known trapeziometacarpal joint arthritis on the right side. Last injection was about 3 months ago, he only had about 2 months of relief. Pain is now moderate, persistent, localized without radiation. Desires repeat interventional treatment today. He also had some questions about CBD oil which he feels as though improves his quality of life, I have no objections.  Past medical history:  Negative.  See flowsheet/record as well for more information.  Surgical history: Negative.  See flowsheet/record as well for more information.  Family history: Negative.  See flowsheet/record as well for more information.  Social history: Negative.  See flowsheet/record as well for more information.  Allergies, and medications have been entered into the medical record, reviewed, and no changes needed.   Review of Systems: No fevers, chills, night sweats, weight loss, chest pain, or shortness of breath.   Objective:    General: Well Developed, well nourished, and in no acute distress.  Neuro: Alert and oriented x3, extra-ocular muscles intact, sensation grossly intact.  HEENT: Normocephalic, atraumatic, pupils equal round reactive to light, neck supple, no masses, no lymphadenopathy, thyroid nonpalpable.  Skin: Warm and dry, no rashes. Cardiac: Regular rate and rhythm, no murmurs rubs or gallops, no lower extremity edema.  Respiratory: Clear to auscultation bilaterally. Not using accessory muscles, speaking in full sentences.  Procedure: Real-time Ultrasound Guided Injection of right trapeziometacarpal joint  Device: GE Logiq E  Verbal informed consent obtained.  Time-out conducted.  Noted no overlying erythema, induration, or other signs of local infection.  Skin prepped in a sterile fashion.  Local anesthesia: Topical Ethyl chloride.  With sterile technique and under real time ultrasound guidance:  1/2 mL Kenalog 40,  1/2 mL lidocaine injected easily  Completed without difficulty  Pain immediately resolved suggesting accurate placement of the medication.  Advised to call if fevers/chills, erythema, induration, drainage, or persistent bleeding.  Images permanently stored and available for review in the ultrasound unit.  Impression: Technically successful ultrasound guided injection.  Impression and Recommendations:    Osteoarthritis of first carpometacarpal joint of right hand Injection as above, previous injection provided 3-1/2 months of relief. Return as needed.

## 2016-05-26 NOTE — Assessment & Plan Note (Signed)
Injection as above, previous injection provided 3-1/2 months of relief. Return as needed.

## 2016-06-09 ENCOUNTER — Ambulatory Visit (AMBULATORY_SURGERY_CENTER): Payer: PPO | Admitting: Gastroenterology

## 2016-06-09 ENCOUNTER — Encounter: Payer: Self-pay | Admitting: Gastroenterology

## 2016-06-09 VITALS — BP 134/78 | HR 75 | Temp 98.0°F | Resp 14 | Ht 66.25 in | Wt 167.0 lb

## 2016-06-09 DIAGNOSIS — K5730 Diverticulosis of large intestine without perforation or abscess without bleeding: Secondary | ICD-10-CM | POA: Diagnosis not present

## 2016-06-09 DIAGNOSIS — M1990 Unspecified osteoarthritis, unspecified site: Secondary | ICD-10-CM

## 2016-06-09 MED ORDER — SODIUM CHLORIDE 0.9 % IV SOLN: 500.0000 mL | INTRAVENOUS | Status: DC

## 2016-06-09 NOTE — Progress Notes (Signed)
To PACU VSS, Report to RN 

## 2016-06-09 NOTE — Patient Instructions (Addendum)
YOU HAD AN ENDOSCOPIC PROCEDURE TODAY AT [facility]:   Refer to the procedure report that was given to you for any specific questions about what was found during the examination.  If the procedure report does not answer your questions, please call your gastroenterologist to clarify.  If you requested that your care partner not be given the details of your procedure findings, then the procedure report has been included in a sealed envelope for you to review at your convenience later.  YOU SHOULD EXPECT: Some feelings of bloating in the abdomen. Passage of more gas than usual.  Walking can help get rid of the air that was put into your GI tract during the procedure and reduce the bloating. If you had a lower endoscopy (such as a colonoscopy or flexible sigmoidoscopy) you may notice spotting of blood in your stool or on the toilet paper. If you underwent a bowel prep for your procedure, you may not have a normal bowel movement for a few days.  Please Note:  You might notice some irritation and congestion in your nose or some drainage.  This is from the oxygen used during your procedure.  There is no need for concern and it should clear up in a day or so.  SYMPTOMS TO REPORT IMMEDIATELY:   Following lower endoscopy (colonoscopy or flexible sigmoidoscopy):  Excessive amounts of blood in the stool  Significant tenderness or worsening of abdominal pains  Swelling of the abdomen that is new, acute  Fever of 100F or higher    For urgent or emergent issues, a gastroenterologist can be reached at any hour by calling [phone].   DIET:  We do recommend a small meal at first, but then you may proceed to your regular diet.  Drink plenty of fluids but you should avoid alcoholic beverages for 24 hours.  ACTIVITY:  You should plan to take it easy for the rest of today and you should NOT DRIVE or use heavy machinery until tomorrow (because of the sedation medicines used during the test).     FOLLOW UP: Our staff will call the number listed on your records the next business day following your procedure to check on you and address any questions or concerns that you may have regarding the information given to you following your procedure. If we do not reach you, we will leave a message.  However, if you are feeling well and you are not experiencing any problems, there is no need to return our call.  We will assume that you have returned to your regular daily activities without incident.  If any biopsies were taken you will be contacted by phone or by letter within the next 1-3 weeks.  Please call us at [phone] if you have not heard about the biopsies in 3 weeks.    SIGNATURES/CONFIDENTIALITY: You and/or your care partner have signed paperwork which will be entered into your electronic medical record.  These signatures attest to the fact that that the information above on your After Visit Summary has been reviewed and is understood.  Full responsibility of the confidentiality of this discharge information lies with you and/or your care-partner.   Resume medications. Information given on hemorrhoids, diverticulosis and high fiber diet.

## 2016-06-09 NOTE — Op Note (Signed)
[facility] Patient Name: [name] Procedure Date: 06/09/2016 9:48 AM MRN: [id number] Endoscopist: Remo Lipps P. Armbruster MD, MD Age: 66 Referring MD:  Date of Birth: 30-Jul-1950 Gender: Male Account #: [id number] Procedure:                Colonoscopy Indications:              Follow-up of suspected recurrent diverticulitis,                            change in bowel habits, history of surgery for                            diverticulitis Medicines:                Monitored Anesthesia Care Procedure:                Pre-Anesthesia Assessment:                           - Prior to the procedure, a History and Physical                            was performed, and patient medications and                            allergies were reviewed. The patient's tolerance of                            previous anesthesia was also reviewed. The risks                            and benefits of the procedure and the sedation                            options and risks were discussed with the patient.                            All questions were answered, and informed consent                            was obtained. Prior Anticoagulants: The patient has                            taken aspirin, last dose was 1 day prior to                            procedure. ASA Grade Assessment: II - A patient                            with mild systemic disease. After reviewing the                            risks and benefits, the patient was deemed in  satisfactory condition to undergo the procedure.                           After obtaining informed consent, the colonoscope                            was passed under direct vision. Throughout the                            procedure, the patient's blood pressure, pulse, and                            oxygen saturations were monitored continuously. The                            Colonoscope was introduced through the  anus and                            advanced to the the terminal ileum, with                            identification of the appendiceal orifice and IC                            valve. The colonoscopy was performed without                            difficulty. The patient tolerated the procedure                            well. The quality of the bowel preparation was                            good. The terminal ileum, ileocecal valve,                            appendiceal orifice, and rectum were photographed. Scope In: 9:59:55 AM Scope Out: 10:11:33 AM Scope Withdrawal Time: 0 hours 9 minutes 51 seconds  Total Procedure Duration: 0 hours 11 minutes 38 seconds  Findings:                 The perianal and digital rectal examinations were                            normal.                           Multiple small and large-mouthed diverticula were                            found in the left colon - both proximal and distal                            to the surgical anastomosis. Mild diverticulosis  noted in the transverse colon and ascending colon.                           Internal hemorrhoids were found during retroflexion.                           There was evidence of a prior end-to-end                            colo-colonic anastomosis in the recto-sigmoid                            colon. This was patent and was characterized by                            healthy appearing mucosa.                           The terminal ileum appeared normal.                           The exam was otherwise without abnormality. No                            polyps Complications:            No immediate complications. Estimated blood loss:                            None. Estimated Blood Loss:     Estimated blood loss: none. Impression:               - Diverticulosis in the left colon, in the                            transverse colon and in the ascending colon as                             outlined.                           - Patent end-to-end colo-colonic anastomosis,                            characterized by healthy appearing mucosa.                           - The examined portion of the ileum was normal.                           - Internal hemorroids                           - The examination was otherwise normal.                           Symptoms are likely related to either recurrent  diverticulitis or symptomatic diverticular disease.                            Recommend CT abdomen at the onset of recurrent                            symptoms to clarify. Recommendation:           - Patient has a contact number available for                            emergencies. The signs and symptoms of potential                            delayed complications were discussed with the                            patient. Return to normal activities tomorrow.                            Written discharge instructions were provided to the                            patient.                           - Resume previous diet.                           - Continue daily fiber supplement                           - Continue present medications.                           - Repeat colonoscopy in 10 years for screening                            purposes.                           - Contact me if you have recurrence of symptoms to                            discuss coordinating CT scan Remo Lipps P. Armbruster MD, MD 06/09/2016 10:18:04 AM This report has been signed electronically.

## 2016-06-10 ENCOUNTER — Telehealth: Payer: Self-pay | Admitting: *Deleted

## 2016-06-10 NOTE — Telephone Encounter (Signed)
  Follow up Call-  Call back number 06/09/2016  Post procedure Call Back phone  # [phone]  Permission to leave phone message Yes  Some recent data might be hidden     Patient questions:  Do you have a fever, pain , or abdominal swelling? No. Pain Score  0 *  Have you tolerated food without any problems? Yes.    Have you been able to return to your normal activities? Yes.    Do you have any questions about your discharge instructions: Diet   No. Medications  No. Follow up visit  No.  Do you have questions or concerns about your Care? No.  Actions: * If pain score is 4 or above: No action needed, pain <4.

## 2016-07-03 ENCOUNTER — Encounter: Payer: Self-pay | Admitting: Sports Medicine

## 2016-07-04 ENCOUNTER — Other Ambulatory Visit: Payer: Self-pay

## 2016-07-04 MED ORDER — SIMVASTATIN 20 MG PO TABS: 20.0000 mg | ORAL_TABLET | Freq: Every morning | ORAL | 1 refills | Status: DC

## 2016-07-19 DIAGNOSIS — M5137 Other intervertebral disc degeneration, lumbosacral region: Secondary | ICD-10-CM | POA: Diagnosis not present

## 2016-08-04 ENCOUNTER — Encounter: Payer: Self-pay | Admitting: Sports Medicine

## 2016-08-05 ENCOUNTER — Encounter: Payer: Self-pay | Admitting: Sports Medicine

## 2016-08-05 MED ORDER — IRBESARTAN 150 MG PO TABS: 150.0000 mg | ORAL_TABLET | Freq: Every day | ORAL | 0 refills | Status: DC

## 2016-08-05 NOTE — Addendum Note (Signed)
Addended by: Silverio Decamp on: 08/05/2016 05:03 PM   Modules accepted: Orders

## 2016-09-05 ENCOUNTER — Ambulatory Visit (INDEPENDENT_AMBULATORY_CARE_PROVIDER_SITE_OTHER): Payer: PPO | Admitting: Sports Medicine

## 2016-09-05 ENCOUNTER — Encounter: Payer: Self-pay | Admitting: Sports Medicine

## 2016-09-05 DIAGNOSIS — Z23 Encounter for immunization: Secondary | ICD-10-CM

## 2016-09-05 DIAGNOSIS — D229 Melanocytic nevi, unspecified: Secondary | ICD-10-CM | POA: Diagnosis not present

## 2016-09-05 DIAGNOSIS — C439 Malignant melanoma of skin, unspecified: Secondary | ICD-10-CM | POA: Insufficient documentation

## 2016-09-05 DIAGNOSIS — D099 Carcinoma in situ, unspecified: Secondary | ICD-10-CM | POA: Insufficient documentation

## 2016-09-05 DIAGNOSIS — I10 Essential (primary) hypertension: Secondary | ICD-10-CM

## 2016-09-05 DIAGNOSIS — M1811 Unilateral primary osteoarthritis of first carpometacarpal joint, right hand: Secondary | ICD-10-CM

## 2016-09-05 NOTE — Assessment & Plan Note (Signed)
Well controlled, no changes 

## 2016-09-05 NOTE — Assessment & Plan Note (Signed)
On the posterior aspect of the right calf, I do think this is going to need a fairly large excision with at least 2 cm margins. He will return for this.

## 2016-09-05 NOTE — Progress Notes (Signed)
  Subjective:    CC: Multiple issues  HPI: Right carpometacarpal pain: Previous injection was 3-1/2 months ago, desires repeat today, pain is recurrent, moderate, persistent, localized without radiation.  Hypertension: Well controlled.  Suspicious nevus: Has been growing over the past 3 months on the back of the right calf.  Preventive measures: Due for flu vaccine.  Past medical history:  Negative.  See flowsheet/record as well for more information.  Surgical history: Negative.  See flowsheet/record as well for more information.  Family history: Negative.  See flowsheet/record as well for more information.  Social history: Negative.  See flowsheet/record as well for more information.  Allergies, and medications have been entered into the medical record, reviewed, and no changes needed.   Review of Systems: No fevers, chills, night sweats, weight loss, chest pain, or shortness of breath.   Objective:    General: Well Developed, well nourished, and in no acute distress.  Neuro: Alert and oriented x3, extra-ocular muscles intact, sensation grossly intact.  HEENT: Normocephalic, atraumatic, pupils equal round reactive to light, neck supple, no masses, no lymphadenopathy, thyroid nonpalpable.  Skin: Warm and dry, no rashes.There is a 2 cm variegated, irregular nevus, slightly raised on the posterior aspect of the right calf. Cardiac: Regular rate and rhythm, no murmurs rubs or gallops, no lower extremity edema.  Respiratory: Clear to auscultation bilaterally. Not using accessory muscles, speaking in full sentences. Right hand: Pain at the base of the first metacarpal.  Procedure: Real-time Ultrasound Guided Injection of right trapeziometacarpal joint Device: GE Logiq E  Verbal informed consent obtained.  Time-out conducted.  Noted no overlying erythema, induration, or other signs of local infection.  Skin prepped in a sterile fashion.  Local anesthesia: Topical Ethyl chloride.  With  sterile technique and under real time ultrasound guidance:  Using a 25-gauge needle advanced into the joint and injected 1/2 mL Kenalog 40, 1/2 mL lidocaine. Completed without difficulty  Pain immediately resolved suggesting accurate placement of the medication.  Advised to call if fevers/chills, erythema, induration, drainage, or persistent bleeding.  Images permanently stored and available for review in the ultrasound unit.  Impression: Technically successful ultrasound guided injection.  Impression and Recommendations:    Osteoarthritis of first carpometacarpal joint of right hand 3.5 months since last injection, repeated today.  Essential hypertension, benign Well-controlled, no changes.  Suspicious nevus On the posterior aspect of the right calf, I do think this is going to need a fairly large excision with at least 2 cm margins. He will return for this.

## 2016-09-05 NOTE — Assessment & Plan Note (Signed)
3.5 months since last injection, repeated today.

## 2016-09-06 MED ORDER — IRBESARTAN 150 MG PO TABS: 150.0000 mg | ORAL_TABLET | Freq: Every day | ORAL | 3 refills | Status: DC

## 2016-09-14 ENCOUNTER — Encounter: Payer: Self-pay | Admitting: Sports Medicine

## 2016-09-14 ENCOUNTER — Ambulatory Visit (INDEPENDENT_AMBULATORY_CARE_PROVIDER_SITE_OTHER): Payer: PPO | Admitting: Sports Medicine

## 2016-09-14 ENCOUNTER — Other Ambulatory Visit: Payer: Self-pay | Admitting: Sports Medicine

## 2016-09-14 DIAGNOSIS — D229 Melanocytic nevi, unspecified: Secondary | ICD-10-CM

## 2016-09-14 DIAGNOSIS — D0471 Carcinoma in situ of skin of right lower limb, including hip: Secondary | ICD-10-CM | POA: Diagnosis not present

## 2016-09-14 NOTE — Addendum Note (Signed)
Addended by: Teddy Spike on: 09/14/2016 03:20 PM   Modules accepted: Orders

## 2016-09-14 NOTE — Assessment & Plan Note (Signed)
Full surgical excision with primary closure. Return in one week for removal of reinforcing sutures. As also a running subcuticular suture.

## 2016-09-14 NOTE — Progress Notes (Signed)
   Procedure:  Excision of 4 cm suspicious nevus on the right posterior calf Risks, benefits, and alternatives explained and consent obtained. Time out conducted. Surface prepped with alcohol. 10cc lidocaine with epinephine infiltrated in a field block. Adequate anesthesia ensured. Area prepped and draped in a sterile fashion. Excision performed with: Using a #15 blade I made an elliptical incision keeping at least a 1 cm margin, I carried the dissection down into the subcutaneous tissues taking care to avoid going deep enough to encounter the sural nerve, I made a clean excision and then closed it with a running subcuticular 3-0 Vicryl reinforced by #3 0-Prolene simple interrupted sutures. Hemostasis achieved. Pt stable.

## 2016-09-14 NOTE — Addendum Note (Signed)
Addended by: Elizabeth Sauer on: 09/14/2016 03:20 PM   Modules accepted: Orders

## 2016-09-14 NOTE — Addendum Note (Signed)
Addended by: Narda Rutherford on: 09/14/2016 03:48 PM   Modules accepted: Orders

## 2016-09-15 ENCOUNTER — Encounter: Payer: Self-pay | Admitting: Sports Medicine

## 2016-09-21 ENCOUNTER — Ambulatory Visit: Payer: Self-pay | Admitting: Sports Medicine

## 2016-09-23 ENCOUNTER — Ambulatory Visit: Payer: Self-pay | Admitting: Sports Medicine

## 2016-09-26 ENCOUNTER — Encounter: Payer: Self-pay | Admitting: Sports Medicine

## 2016-09-26 ENCOUNTER — Ambulatory Visit (INDEPENDENT_AMBULATORY_CARE_PROVIDER_SITE_OTHER): Payer: PPO | Admitting: Sports Medicine

## 2016-09-26 DIAGNOSIS — D099 Carcinoma in situ, unspecified: Secondary | ICD-10-CM

## 2016-09-26 NOTE — Progress Notes (Signed)
  Subjective: Follow-up, approximately 10 days post-complete excision of a squamous cell carcinoma, doing well.   Objective: General: Well-developed, well-nourished, and in no acute distress. Incision: Clean, dry, intact. Slight erythema, but really nontender, no drainage. I did apply some Dermabond to reinforce the incision. He was able to move the reinforcing sutures himself.  Assessment/plan:   Squamous cell carcinoma in situ Complete excision with 1 cm margins. Pathology results showed a squamous cell carcinoma in situ buried within the seborrheic keratosis with free margins. Incision looks good, he removed the reinforcing sutures this weekend himself at home. I did apply some Dermabond to hold off tension on the wound, overall things look good. Return as needed for this.  ___________________________________________ Gwen Her. Dianah Field, M.D., ABFM., CAQSM. Primary Care and [place] Instructor of [place] of [facility] of Medicine

## 2016-09-26 NOTE — Assessment & Plan Note (Signed)
Complete excision with 1 cm margins. Pathology results showed a squamous cell carcinoma in situ buried within the seborrheic keratosis with free margins. Incision looks good, he removed the reinforcing sutures this weekend himself at home. I did apply some Dermabond to hold off tension on the wound, overall things look good. Return as needed for this.

## 2016-10-01 ENCOUNTER — Telehealth: Payer: Self-pay | Admitting: Sports Medicine

## 2016-10-01 MED ORDER — DOXYCYCLINE HYCLATE 100 MG PO TABS: 100.0000 mg | ORAL_TABLET | Freq: Two times a day (BID) | ORAL | 0 refills | Status: AC

## 2016-10-01 NOTE — Telephone Encounter (Signed)
Increasing wound breakdown.  Slightly red.  Adding doxycycline.  May need to see me this coming week to close wound again.

## 2016-10-03 ENCOUNTER — Ambulatory Visit (INDEPENDENT_AMBULATORY_CARE_PROVIDER_SITE_OTHER): Payer: PPO | Admitting: Sports Medicine

## 2016-10-03 ENCOUNTER — Encounter: Payer: Self-pay | Admitting: Sports Medicine

## 2016-10-03 DIAGNOSIS — D099 Carcinoma in situ, unspecified: Secondary | ICD-10-CM

## 2016-10-03 NOTE — Assessment & Plan Note (Signed)
2 weeks post margin excision, doing better now that antibiotics have been started. He did have a mild dehiscence of the wound, think ultimately it was a low-grade wound infection. Return as needed.

## 2016-10-03 NOTE — Progress Notes (Signed)
  Subjective: [name] returns, he is about 2 weeks post surgical excision of a squamous cell carcinoma on his left posterior leg. This was complicated by a mild wound infection and dehiscence, the wound was closed, antibiotics were started things are improving considerably.   Objective: General: Well-developed, well-nourished, and in no acute distress. Left leg: Wound is clean, dry, mostly intact. There is a bit of granulation tissue on the superficial part of the wound but no further evidence of dehiscence. Erythema has resolved.  Assessment/plan:   Squamous cell carcinoma in situ 2 weeks post margin excision, doing better now that antibiotics have been started. He did have a mild dehiscence of the wound, think ultimately it was a low-grade wound infection. Return as needed.  ___________________________________________ Billy Her. Dianah Field, M.D., ABFM., CAQSM. Primary Care and [place] Instructor of [place] of [facility] of Medicine

## 2016-11-01 ENCOUNTER — Encounter: Payer: Self-pay | Admitting: Sports Medicine

## 2016-11-01 MED ORDER — AMITRIPTYLINE HCL 25 MG PO TABS: 25.0000 mg | ORAL_TABLET | Freq: Every day | ORAL | 3 refills | Status: DC

## 2016-11-03 ENCOUNTER — Encounter: Payer: Self-pay | Admitting: Sports Medicine

## 2016-11-03 ENCOUNTER — Ambulatory Visit (INDEPENDENT_AMBULATORY_CARE_PROVIDER_SITE_OTHER): Payer: PPO | Admitting: Sports Medicine

## 2016-11-03 DIAGNOSIS — D099 Carcinoma in situ, unspecified: Secondary | ICD-10-CM

## 2016-11-03 MED ORDER — DUODERM CGF DRESSING EX MISC: 1.0000 | CUTANEOUS | 3 refills | Status: DC

## 2016-11-03 NOTE — Patient Instructions (Signed)
Wound Dehiscence Wound dehiscence is when a surgical incision breaks open and does not heal properly after surgery. It usually happens 7-10 days after surgery. This can be a serious condition. It is important to identify and treat this condition early. What are the causes? Common causes of this condition include:  Stretching of the wound area. This may be caused by lifting, vomiting, violent coughing, or straining during bowel movements.  Wound infection.  Early removal or rupture of stitches (sutures).  What increases the risk? The following factors may make you more likely to develop this condition:  Obesity.  Lung disease.  Smoking.  Poor nutrition.  Contamination during surgery.  Medical problems that make it harder to heal, such as diabetes or autoimmune diseases.  What are the signs or symptoms? Symptoms of this condition include:  Bleeding or drainage from the wound.  Pain.  Fever.  The wound starting to break open.  How is this diagnosed? Your health care provider may diagnose wound dehiscence by monitoring the incision and noting any changes in the wound. These changes can include a gap in the incision and an increase in drainage or pain. The health care provider may ask you if you have noticed any stretching or tearing of the wound. You may also have tests, including:  Wound culture tests to determine if there is an infection.  Imaging studies, such as an MRI scan or CT scan, to determine if there is a collection of pus or fluid in the wound area.  Blood tests to check for infection and inflammation.  How is this treated? Treatment for this condition may include:  Wound care to keep the incision clean and help it heal from the inside out.  Surgical repair.  Antibiotic medicine to treat or prevent infection.  Medicines to reduce pain and swelling.  Follow these instructions at home: Medicines  Take over-the-counter and prescription medicines only  as told by your health care provider. Taking pain medicine 30 minutes before changing a bandage (dressing) can help relieve pain.  If you were prescribed an antibiotic, take it as told by your health care provider. Do not stop taking the antibiotic even if you start to feel better.  Use anti-itch medicine as told by your health care provider. The wound may feel itchy when it is healing. Wound care  Follow instructions from your health care provider about how to take care of your wound. Make sure you: ? Wash your hands with soap and water before you change your bandage (dressing) or wash the wound area. If soap and water are not available, use hand sanitizer. ? Gently wash the wound area with mild soap and water 2 times a day, or as directed. Rinse off the soap. Pat the area dry with a clean towel. Do not rub the wound. ? Change the dressing and the packing inside as told by your health care provider.  Do not scratch or pick at the wound.  Check your wound area every day for signs of infection. Check for: ? More redness, swelling, or pain. ? More fluid or blood. ? Warmth. ? Pus or a bad smell. Activity  Avoid exercises that make you sweat heavily or could cause stretching of your wound.  Do not lift anything that is heavier than 10 lb (4.5 kg) until the wound is healed or until your health care provider says that it is safe. General instructions  Do not take baths, swim, or use a hot tub until your health   care provider approves. You may take showers.  Keep all follow-up visits as told by your health care provider. This is important. Contact a health care provider if:  Your wound does not seem to be healing properly.  You have a fever. Get help right away if:  You have more redness, swelling, or pain around your wound.  You have more fluid coming from your wound.  Your wound feels warm to the touch.  You have pus or a bad smell coming from your wound.  Your wound breaks open  farther.  You have redness streaking or spreading from your wound.  You have excessive bleeding from your wound. Summary  Wound dehiscence is when a surgical incision breaks open and does not heal properly after surgery.  Your health care provider may diagnose wound dehiscence by monitoring the incision and noting any changes in the wound.  Follow instructions from your health care provider about how to take care of your wound.  Check your wound area every day for signs of infection, such as redness, swelling, pain, fluid, blood, warmth, pus, or a bad smell.  If you were prescribed antibiotic medicine, take it as told by your health care provider. Do not stop taking the antibiotic even if you start to feel better. This information is not intended to replace advice given to you by your health care provider. Make sure you discuss any questions you have with your health care provider. Document Released: 03/19/2003 Document Revised: 12/02/2015 Document Reviewed: 12/02/2015 Elsevier Interactive Patient Education  2017 Elsevier Inc.  

## 2016-11-03 NOTE — Progress Notes (Signed)
  Subjective: [name] returns, he is about a month and a half post excision of what ended up being a squamous cell carcinoma on the back of his right calf.  There was a lot of tension on the wound closure, and ultimately the wound dehisced.  Unfortunately it still open, minimally tender.  Objective: General: Well-developed, well-nourished, and in no acute distress. Right calf: Incision is open, there is some visible yellowish and blackish eschar, only minimal surrounding erythema without induration, no purulent drainage.  Opening is approximately 2 cm long and 1 cm across.  Visible early granulation tissue and subcutaneous fat.  This was aggressively debrided, I then applied a hydrocolloid dressing.  Assessment/plan:   Squamous cell carcinoma in situ There has been a dehiscence of the wound. This was aggressively debrided, DuoDERM applied. Prescriptions written for DuoDERM, he will change his dressing every 2 days. Return in 1 week, explained healing by secondary intention and the fact that this may take weeks to months to fully heal.  ___________________________________________ Gwen Her. Dianah Field, M.D., ABFM., CAQSM. Primary Care and [place] Instructor of [place] of [facility] of Medicine

## 2016-11-03 NOTE — Assessment & Plan Note (Signed)
There has been a dehiscence of the wound. This was aggressively debrided, DuoDERM applied. Prescriptions written for DuoDERM, he will change his dressing every 2 days. Return in 1 week, explained healing by secondary intention and the fact that this may take weeks to months to fully heal.

## 2016-11-04 ENCOUNTER — Encounter: Payer: Self-pay | Admitting: Sports Medicine

## 2016-11-04 ENCOUNTER — Ambulatory Visit: Payer: Self-pay | Admitting: Sports Medicine

## 2016-11-04 MED ORDER — AMITRIPTYLINE HCL 25 MG PO TABS: 25.0000 mg | ORAL_TABLET | Freq: Every day | ORAL | 3 refills | Status: DC

## 2016-11-10 ENCOUNTER — Ambulatory Visit (INDEPENDENT_AMBULATORY_CARE_PROVIDER_SITE_OTHER): Payer: PPO | Admitting: Sports Medicine

## 2016-11-10 ENCOUNTER — Encounter: Payer: Self-pay | Admitting: Sports Medicine

## 2016-11-10 DIAGNOSIS — D099 Carcinoma in situ, unspecified: Secondary | ICD-10-CM | POA: Diagnosis not present

## 2016-11-10 MED ORDER — DOXYCYCLINE HYCLATE 100 MG PO TABS: 100.0000 mg | ORAL_TABLET | Freq: Two times a day (BID) | ORAL | 0 refills | Status: DC

## 2016-11-10 NOTE — Assessment & Plan Note (Signed)
Repeat wound care today, I did take a wound culture as there was a bit of malodorous discharge. Adding doxycycline. DuoDERM applied, and he can change his dressing every 3 days. We are letting this wound heal by secondary intention, he will return in 1 week.

## 2016-11-10 NOTE — Progress Notes (Signed)
  Subjective:    CC: Wound check  HPI: [name] returns, over a month ago we removed a squamous cell carcinoma from his leg, he had a dehiscence of the wound, we debrided the wound last week, and applied DuoDERM dressing, no cultures or antibiotics were used.  He returns today, the wound does have a slight malodorous discharge without any purulent adherent film.  Minimal pain.  Past medical history:  Negative.  See flowsheet/record as well for more information.  Surgical history: Negative.  See flowsheet/record as well for more information.  Family history: Negative.  See flowsheet/record as well for more information.  Social history: Negative.  See flowsheet/record as well for more information.  Allergies, and medications have been entered into the medical record, reviewed, and no changes needed.   Review of Systems: No fevers, chills, night sweats, weight loss, chest pain, or shortness of breath.   Objective:    General: Well Developed, well nourished, and in no acute distress.  Neuro: Alert and oriented x3, extra-ocular muscles intact, sensation grossly intact.  HEENT: Normocephalic, atraumatic, pupils equal round reactive to light, neck supple, no masses, no lymphadenopathy, thyroid nonpalpable.  Skin: Warm and dry, no rashes. Cardiac: Regular rate and rhythm, no murmurs rubs or gallops, no lower extremity edema.  Respiratory: Clear to auscultation bilaterally. Not using accessory muscles, speaking in full sentences. Right calf: Wound is about the same size, no purulence, mild malodorous clear discharge, visible good granulation tissue at the base of the wound, approximately 4 cm x 3 cm.  Impression and Recommendations:    Squamous cell carcinoma in situ Repeat wound care today, I did take a wound culture as there was a bit of malodorous discharge. Adding doxycycline. DuoDERM applied, and he can change his dressing every 3 days. We are letting this wound heal by secondary intention, he  will return in 1 week.  I spent 25 minutes with this patient, greater than 50% was face-to-face time counseling regarding the above diagnoses ___________________________________________ Gwen Her. Dianah Field, M.D., ABFM., CAQSM. Primary Care and [place] Instructor of [place] of [facility] of [facility]

## 2016-11-14 ENCOUNTER — Encounter: Payer: Self-pay | Admitting: Sports Medicine

## 2016-11-16 ENCOUNTER — Telehealth: Payer: Self-pay | Admitting: *Deleted

## 2016-11-16 LAB — WOUND CULTURE
MICRO NUMBER:: 81227765
SPECIMEN QUALITY:: ADEQUATE

## 2016-11-16 NOTE — Telephone Encounter (Signed)
There is an existing PA on file with insurance for amitriptyline 25 mg that is good until 01/09/2018. This is per the insurance company. Azzie Roup phone [phone])

## 2016-11-17 ENCOUNTER — Ambulatory Visit: Payer: PPO | Admitting: Sports Medicine

## 2016-11-17 ENCOUNTER — Encounter: Payer: Self-pay | Admitting: Sports Medicine

## 2016-11-17 DIAGNOSIS — D099 Carcinoma in situ, unspecified: Secondary | ICD-10-CM

## 2016-11-17 NOTE — Progress Notes (Signed)
  Subjective:    CC: Wound check  HPI: [name] is a pleasant 66 year old male, several weeks ago I removed a squamous cell carcinoma, he developed a mild wound dehiscence, we have been doing wound care since then, continued improvements.  Past medical history:  Negative.  See flowsheet/record as well for more information.  Surgical history: Negative.  See flowsheet/record as well for more information.  Family history: Negative.  See flowsheet/record as well for more information.  Social history: Negative.  See flowsheet/record as well for more information.  Allergies, and medications have been entered into the medical record, reviewed, and no changes needed.   Review of Systems: No fevers, chills, night sweats, weight loss, chest pain, or shortness of breath.   Objective:    General: Well Developed, well nourished, and in no acute distress.  Neuro: Alert and oriented x3, extra-ocular muscles intact, sensation grossly intact.  HEENT: Normocephalic, atraumatic, pupils equal round reactive to light, neck supple, no masses, no lymphadenopathy, thyroid nonpalpable.  Skin: Warm and dry, no rashes. Cardiac: Regular rate and rhythm, no murmurs rubs or gallops, no lower extremity edema.  Respiratory: Clear to auscultation bilaterally. Not using accessory muscles, speaking in full sentences. Right leg: Wound is about the same size but there is a slight contraction of the wound, granulation tissue looks good, no purulent discharge.  This was debrided, and a new dressing was applied with antibiotic ointment.  Good sensation across the wound.  Impression and Recommendations:    Squamous cell carcinoma in situ Repeat wound care today, good granulation tissue, cultures grew out Staphylococcus species, treated with doxycycline. Continue every other day dressing changes, and aggressive wound debridement at home. He will touch base with me in 2 weeks.  I spent 25 minutes with this patient, greater than  50% was face-to-face time counseling regarding the above diagnoses ___________________________________________ Gwen Her. Dianah Field, M.D., ABFM., CAQSM. Primary Care and [place] Instructor of [place] of [facility]

## 2016-11-17 NOTE — Assessment & Plan Note (Signed)
Repeat wound care today, good granulation tissue, cultures grew out Staphylococcus species, treated with doxycycline. Continue every other day dressing changes, and aggressive wound debridement at home. He will touch base with me in 2 weeks.

## 2016-11-23 DIAGNOSIS — N5231 Erectile dysfunction following radical prostatectomy: Secondary | ICD-10-CM | POA: Diagnosis not present

## 2016-11-23 DIAGNOSIS — Z8546 Personal history of malignant neoplasm of prostate: Secondary | ICD-10-CM | POA: Diagnosis not present

## 2016-11-30 DIAGNOSIS — L570 Actinic keratosis: Secondary | ICD-10-CM | POA: Diagnosis not present

## 2016-11-30 DIAGNOSIS — D1801 Hemangioma of skin and subcutaneous tissue: Secondary | ICD-10-CM | POA: Diagnosis not present

## 2016-11-30 DIAGNOSIS — Z85828 Personal history of other malignant neoplasm of skin: Secondary | ICD-10-CM | POA: Diagnosis not present

## 2016-11-30 DIAGNOSIS — L821 Other seborrheic keratosis: Secondary | ICD-10-CM | POA: Diagnosis not present

## 2016-12-13 ENCOUNTER — Encounter: Payer: Self-pay | Admitting: Sports Medicine

## 2016-12-13 MED ORDER — SIMVASTATIN 20 MG PO TABS: 20.0000 mg | ORAL_TABLET | Freq: Every morning | ORAL | 1 refills | Status: DC

## 2017-02-09 DIAGNOSIS — M5416 Radiculopathy, lumbar region: Secondary | ICD-10-CM | POA: Diagnosis not present

## 2017-03-07 ENCOUNTER — Ambulatory Visit (INDEPENDENT_AMBULATORY_CARE_PROVIDER_SITE_OTHER): Payer: PPO | Admitting: Sports Medicine

## 2017-03-07 ENCOUNTER — Encounter: Payer: Self-pay | Admitting: Sports Medicine

## 2017-03-07 VITALS — BP 133/86 | HR 75 | Resp 16 | Ht 67.0 in | Wt 168.0 lb

## 2017-03-07 DIAGNOSIS — C61 Malignant neoplasm of prostate: Secondary | ICD-10-CM

## 2017-03-07 DIAGNOSIS — E782 Mixed hyperlipidemia: Secondary | ICD-10-CM

## 2017-03-07 DIAGNOSIS — Z23 Encounter for immunization: Secondary | ICD-10-CM

## 2017-03-07 DIAGNOSIS — Z0000 Encounter for general adult medical examination without abnormal findings: Secondary | ICD-10-CM | POA: Diagnosis not present

## 2017-03-07 DIAGNOSIS — N5239 Other post-surgical erectile dysfunction: Secondary | ICD-10-CM

## 2017-03-07 NOTE — Progress Notes (Addendum)
Subjective:    CC: Annual physical  HPI:  [name] is here for his physical, he really has no complaints.  I reviewed the past medical history, family history, social history, surgical history, and allergies today and no changes were needed.  Please see the problem list section below in epic for further details.  Past Medical History: Past Medical History:  Diagnosis Date  . ADD (attention deficit disorder)   . Anxiety   . Arthritis 2016   bilateral hands  . Cancer ([place])    PROSTATE  . Diverticulitis   . Hyperlipidemia   . Hypertension   . Pain    HX OF 3 BACK SURGERIES - STILL HAS SEVERE BACK PAIN WITH PROLONGED SITTING OR STANDING  . PONV (postoperative nausea and vomiting)    NAUSEA AFTER SHOULDER SURGERY  . Reflux    MILD - NO DAILY MEDS - ANTIACID IF NEEDED   Past Surgical History: Past Surgical History:  Procedure Laterality Date  . [place], 2007  . COLON SURGERY  2008   COLON RESECTION FOR DIVERTICULITIS  . HERNIA REPAIR  1997 OR 1998   ING HERNIA REPAIR  . RIGHT SHOULDER 2013    . ROBOT ASSISTED LAPAROSCOPIC RADICAL PROSTATECTOMY N/A 02/13/2013   Procedure: ROBOTIC ASSISTED LAPAROSCOPIC RADICAL PROSTATECTOMY,, LYSIS OF PELVIC / INTRA-ABDOMINAL ADHESIONS;  Surgeon: Bernestine Amass, MD;  Location: [facility];  Service: Urology;  Laterality: N/A;   Social History: Social History   Socioeconomic History  . Marital status: Married    Spouse name: [name]  . Number of children: 2  . Years of education: None  . Highest education level: None  Social Needs  . Financial resource strain: None  . Food insecurity - worry: None  . Food insecurity - inability: None  . Transportation needs - medical: None  . Transportation needs - non-medical: None  Occupational History  . Occupation: Retired  Tobacco Use  . Smoking status: Never Smoker  . Smokeless tobacco: Never Used  Substance and Sexual Activity  . Alcohol use: Yes    Comment: SOCIAL  . Drug  use: No  . Sexual activity: Yes  Other Topics Concern  . None  Social History Narrative  . None   Family History: Family History  Problem Relation Age of Onset  . Heart attack Mother   . Hyperlipidemia Mother   . Hypertension Mother   . Stroke Mother   . Heart attack Father   . Hypertension Father   . Hyperlipidemia Father   . Diabetes Father   . Colon cancer Neg Hx   . Esophageal cancer Neg Hx   . Rectal cancer Neg Hx   . Stomach cancer Neg Hx    Allergies: No Known Allergies Medications: See med rec.  Review of Systems: No headache, visual changes, nausea, vomiting, diarrhea, constipation, dizziness, abdominal pain, skin rash, fevers, chills, night sweats, swollen lymph nodes, weight loss, chest pain, body aches, joint swelling, muscle aches, shortness of breath, mood changes, visual or auditory hallucinations.  Objective:    General: Well Developed, well nourished, and in no acute distress.  Neuro: Alert and oriented x3, extra-ocular muscles intact, sensation grossly intact. Cranial nerves II through XII are intact, motor, sensory, and coordinative functions are all intact. HEENT: Normocephalic, atraumatic, pupils equal round reactive to light, neck supple, no masses, no lymphadenopathy, thyroid nonpalpable. Oropharynx, nasopharynx, external ear canals are unremarkable. Skin: Warm and dry, no rashes noted.  Cardiac: Regular  rate and rhythm, no murmurs rubs or gallops.  Respiratory: Clear to auscultation bilaterally. Not using accessory muscles, speaking in full sentences.  Abdominal: Soft, nontender, nondistended, positive bowel sounds, no masses, no organomegaly.  Musculoskeletal: Shoulder, elbow, wrist, hip, knee, ankle stable, and with full range of motion.  Impression and Recommendations:    The patient was counselled, risk factors were discussed, anticipatory guidance given.  Annual physical exam Annual physical as above, checking routine blood  work. Tdap. Return in 1 year. He will need his PSA checked in 6 months.  Post-surgical erectile dysfunction We have tried multiple modalities, sildenafil, Cialis, Levitra and [place] are too expensive. Prostaglandin shots and pellets are also too expensive. Penis ring has been somewhat effective.  Malignant neoplasm of prostate Rechecking PSA in 6 months. Future orders placed.  Hyperlipidemia, mixed Lipids are mildly elevated, I do think we need to bump up simvastatin to 40 mg.  Double what he has, and then I will call in 40 mg tabs.  Recheck in 3 months.  ___________________________________________ Gwen Her. Dianah Field, M.D., ABFM., CAQSM. Primary Care and [place] Instructor of [place] of [facility] of Medicine

## 2017-03-07 NOTE — Assessment & Plan Note (Signed)
We have tried multiple modalities, sildenafil, Cialis, Levitra and [place] are too expensive. Prostaglandin shots and pellets are also too expensive. Penis ring has been somewhat effective.

## 2017-03-07 NOTE — Assessment & Plan Note (Signed)
Rechecking PSA in 6 months. Future orders placed.

## 2017-03-07 NOTE — Assessment & Plan Note (Signed)
Annual physical as above, checking routine blood work. Tdap. Return in 1 year. He will need his PSA checked in 6 months.

## 2017-03-08 ENCOUNTER — Encounter: Payer: Self-pay | Admitting: Sports Medicine

## 2017-03-08 DIAGNOSIS — M65331 Trigger finger, right middle finger: Secondary | ICD-10-CM

## 2017-03-08 DIAGNOSIS — E782 Mixed hyperlipidemia: Secondary | ICD-10-CM | POA: Insufficient documentation

## 2017-03-08 LAB — LIPID PANEL W/REFLEX DIRECT LDL
Cholesterol: 201 mg/dL — ABNORMAL HIGH (ref <200)
HDL: 51 mg/dL (ref >40)
LDL Cholesterol (Calc): 122 mg/dL (calc) — ABNORMAL HIGH
Non-HDL Cholesterol (Calc): 150 mg/dL (calc) — ABNORMAL HIGH (ref <130)
Total CHOL/HDL Ratio: 3.9 (calc) (ref <5.0)
Triglycerides: 164 mg/dL — ABNORMAL HIGH (ref <150)

## 2017-03-08 LAB — COMPREHENSIVE METABOLIC PANEL WITH GFR
AG Ratio: 1.9 (calc) (ref 1.0–2.5)
ALT: 36 U/L (ref 9–46)
AST: 36 U/L — ABNORMAL HIGH (ref 10–35)
Alkaline phosphatase (APISO): 55 U/L (ref 40–115)
CO2: 30 mmol/L (ref 20–32)
Chloride: 101 mmol/L (ref 98–110)
Creat: 0.94 mg/dL (ref 0.70–1.25)
Globulin: 2.4 g/dL (ref 1.9–3.7)
Glucose, Bld: 92 mg/dL (ref 65–99)

## 2017-03-08 LAB — CBC
HCT: 45.7 % (ref 38.5–50.0)
Hemoglobin: 15.6 g/dL (ref 13.2–17.1)
MCH: 30.8 pg (ref 27.0–33.0)
MCHC: 34.1 g/dL (ref 32.0–36.0)
MCV: 90.1 fL (ref 80.0–100.0)
MPV: 10 fL (ref 7.5–12.5)
Platelets: 242 Thousand/uL (ref 140–400)
RBC: 5.07 Million/uL (ref 4.20–5.80)
RDW: 12.2 % (ref 11.0–15.0)
WBC: 6.4 10*3/uL (ref 3.8–10.8)

## 2017-03-08 LAB — COMPREHENSIVE METABOLIC PANEL
Albumin: 4.6 g/dL (ref 3.6–5.1)
BUN: 16 mg/dL (ref 7–25)
Calcium: 9.6 mg/dL (ref 8.6–10.3)
Potassium: 4.4 mmol/L (ref 3.5–5.3)
Sodium: 137 mmol/L (ref 135–146)
Total Bilirubin: 0.6 mg/dL (ref 0.2–1.2)
Total Protein: 7 g/dL (ref 6.1–8.1)

## 2017-03-08 LAB — HEMOGLOBIN A1C
Hgb A1c MFr Bld: 6.2 %{Hb} — ABNORMAL HIGH (ref <5.7)
Mean Plasma Glucose: 131 (calc)
eAG (mmol/L): 7.3 (calc)

## 2017-03-08 LAB — TSH: TSH: 2.38 mIU/L (ref 0.40–4.50)

## 2017-03-08 MED ORDER — SIMVASTATIN 40 MG PO TABS: 40.0000 mg | ORAL_TABLET | Freq: Every day | ORAL | 3 refills | Status: DC

## 2017-03-08 NOTE — Assessment & Plan Note (Signed)
Lipids are mildly elevated, I do think we need to bump up simvastatin to 40 mg.  Double what he has, and then I will call in 40 mg tabs.  Recheck in 3 months.

## 2017-03-08 NOTE — Addendum Note (Signed)
Addended by: Silverio Decamp on: 03/08/2017 08:46 AM   Modules accepted: Orders

## 2017-03-10 ENCOUNTER — Encounter: Payer: Self-pay | Admitting: Sports Medicine

## 2017-03-10 DIAGNOSIS — M5136 Other intervertebral disc degeneration, lumbar region: Secondary | ICD-10-CM | POA: Diagnosis not present

## 2017-03-16 ENCOUNTER — Encounter (INDEPENDENT_AMBULATORY_CARE_PROVIDER_SITE_OTHER): Payer: Self-pay | Admitting: Orthopaedic Surgery

## 2017-03-16 ENCOUNTER — Ambulatory Visit (INDEPENDENT_AMBULATORY_CARE_PROVIDER_SITE_OTHER): Payer: PPO

## 2017-03-16 ENCOUNTER — Ambulatory Visit (INDEPENDENT_AMBULATORY_CARE_PROVIDER_SITE_OTHER): Payer: PPO | Admitting: Orthopaedic Surgery

## 2017-03-16 DIAGNOSIS — M79641 Pain in right hand: Secondary | ICD-10-CM

## 2017-03-16 DIAGNOSIS — M1811 Unilateral primary osteoarthritis of first carpometacarpal joint, right hand: Secondary | ICD-10-CM

## 2017-03-16 DIAGNOSIS — M65341 Trigger finger, right ring finger: Secondary | ICD-10-CM

## 2017-03-16 DIAGNOSIS — M65331 Trigger finger, right middle finger: Secondary | ICD-10-CM

## 2017-03-16 NOTE — Progress Notes (Signed)
Office Visit Note   Patient: [name]           Date of Birth: Jun 25, 1950           MRN: [id number] Visit Date: 03/16/2017              Requested by: Silverio Decamp, [address] [place], [place] [id number] PCP: Silverio Decamp, MD   Assessment & Plan: Visit Diagnoses:  1. Trigger ring finger of right hand   2. Trigger middle finger of right hand   3. Primary osteoarthritis of first carpometacarpal joint of right hand     Plan: Impression is right thumb CMC arthritis and right middle and ring trigger fingers.  At this point patient has failed conservative treatment in regards to his trigger fingers and wishes to proceed with trigger finger releases for both the middle and ring fingers.  He had a full discussion of risk benefits alternatives to surgery which he understands and wished to proceed.  We will get him scheduled in the future.  Follow-Up Instructions: Return if symptoms worsen or fail to improve.   Orders:  Orders Placed This Encounter  Procedures  . XR Hand Complete Right   No orders of the defined types were placed in this encounter.     Procedures: No procedures performed   Clinical Data: No additional findings.   Subjective: Chief Complaint  Patient presents with  . Right Hand - Pain  . Right Middle Finger - Pain    Patient is a pleasant 67 year old gentleman who comes in for mainly discussion of surgical treatment for his right middle trigger finger.  He is also experiencing triggering of his ring finger.  He also complains of right thumb CMC arthritis which she is getting  cortisone injections for.  He has had 2 previous trigger finger injections with temporary and partial relief.  He denies any numbness and tingling or injuries.    Review of Systems  Constitutional: Negative.   All other systems reviewed and are negative.    Objective: Vital Signs: There were no vitals taken for this visit.  Physical  Exam  Constitutional: He is oriented to person, place, and time. He appears well-developed and well-nourished.  HENT:  Head: Normocephalic and atraumatic.  Eyes: Pupils are equal, round, and reactive to light.  Neck: Neck supple.  Pulmonary/Chest: Effort normal.  Abdominal: Soft.  Musculoskeletal: Normal range of motion.  Neurological: He is alert and oriented to person, place, and time.  Skin: Skin is warm.  Psychiatric: He has a normal mood and affect. His behavior is normal. Judgment and thought content normal.  Nursing note and vitals reviewed.   Ortho Exam Right hand exam shows a negative grind test.  He does have a tender A1 pulley of the middle and ring finger.  He does not have any significant triggering but he does have significant pain with flexion of the finger localizes to the A1 pulley.  There are no palpable cords.  He is able to make a full composite fist with pain. Specialty Comments:  No specialty comments available.  Imaging: Xr Hand Complete Right  Result Date: 03/16/2017 Significant thumb no other CMC arthritis.  No other significant findings    PMFS History: Patient Active Problem List   Diagnosis Date Noted  . Hyperlipidemia, mixed 03/08/2017  . Squamous cell carcinoma in situ 09/05/2016  . Conjunctivitis 03/01/2016  . Generalized anxiety disorder 01/01/2016  . Postlaminectomy syndrome, lumbar region  11/16/2015  . Osteoarthritis of first carpometacarpal joint of right hand 11/16/2015  . Post-surgical erectile dysfunction 11/16/2015  . Diverticulosis 02/13/2015  . Benign paroxysmal positional vertigo 02/02/2015  . Annual physical exam 01/29/2015  . Essential hypertension, benign 01/29/2015  . Trigger middle finger of right hand 01/14/2015  . Arthritis 01/10/2014  . Malignant neoplasm of prostate ([place]) 02/13/2013   Past Medical History:  Diagnosis Date  . ADD (attention deficit disorder)   . Anxiety   . Arthritis 2016   bilateral hands  . Cancer  ([place])    PROSTATE  . Diverticulitis   . Hyperlipidemia   . Hypertension   . Pain    HX OF 3 BACK SURGERIES - STILL HAS SEVERE BACK PAIN WITH PROLONGED SITTING OR STANDING  . PONV (postoperative nausea and vomiting)    NAUSEA AFTER SHOULDER SURGERY  . Reflux    MILD - NO DAILY MEDS - ANTIACID IF NEEDED    Family History  Problem Relation Age of Onset  . Heart attack Mother   . Hyperlipidemia Mother   . Hypertension Mother   . Stroke Mother   . Heart attack Father   . Hypertension Father   . Hyperlipidemia Father   . Diabetes Father   . Colon cancer Neg Hx   . Esophageal cancer Neg Hx   . Rectal cancer Neg Hx   . Stomach cancer Neg Hx     Past Surgical History:  Procedure Laterality Date  . [place], 2007  . COLON SURGERY  2008   COLON RESECTION FOR DIVERTICULITIS  . HERNIA REPAIR  1997 OR 1998   ING HERNIA REPAIR  . RIGHT SHOULDER 2013    . ROBOT ASSISTED LAPAROSCOPIC RADICAL PROSTATECTOMY N/A 02/13/2013   Procedure: ROBOTIC ASSISTED LAPAROSCOPIC RADICAL PROSTATECTOMY,, LYSIS OF PELVIC / INTRA-ABDOMINAL ADHESIONS;  Surgeon: Bernestine Amass, MD;  Location: [facility];  Service: Urology;  Laterality: N/A;   Social History   Occupational History  . Occupation: Retired  Tobacco Use  . Smoking status: Never Smoker  . Smokeless tobacco: Never Used  Substance and Sexual Activity  . Alcohol use: Yes    Comment: SOCIAL  . Drug use: No  . Sexual activity: Yes

## 2017-03-22 ENCOUNTER — Encounter (INDEPENDENT_AMBULATORY_CARE_PROVIDER_SITE_OTHER): Payer: Self-pay | Admitting: Orthopaedic Surgery

## 2017-03-22 NOTE — Telephone Encounter (Signed)
Yes thanks 

## 2017-03-22 NOTE — Telephone Encounter (Signed)
Ok to do?  I'll add to consent if so

## 2017-03-23 NOTE — Telephone Encounter (Signed)
Thank you for adding right cmc to consent

## 2017-03-31 ENCOUNTER — Encounter (INDEPENDENT_AMBULATORY_CARE_PROVIDER_SITE_OTHER): Payer: Self-pay | Admitting: Orthopaedic Surgery

## 2017-03-31 DIAGNOSIS — M1811 Unilateral primary osteoarthritis of first carpometacarpal joint, right hand: Secondary | ICD-10-CM | POA: Diagnosis not present

## 2017-03-31 DIAGNOSIS — M65341 Trigger finger, right ring finger: Secondary | ICD-10-CM | POA: Diagnosis not present

## 2017-03-31 DIAGNOSIS — M65331 Trigger finger, right middle finger: Secondary | ICD-10-CM | POA: Diagnosis not present

## 2017-04-07 ENCOUNTER — Telehealth: Payer: Self-pay | Admitting: Gastroenterology

## 2017-04-07 ENCOUNTER — Other Ambulatory Visit: Payer: Self-pay

## 2017-04-07 MED ORDER — METRONIDAZOLE 500 MG PO TABS: 500.0000 mg | ORAL_TABLET | Freq: Three times a day (TID) | ORAL | 0 refills | Status: AC

## 2017-04-07 MED ORDER — CIPROFLOXACIN HCL 500 MG PO TABS: 500.0000 mg | ORAL_TABLET | Freq: Two times a day (BID) | ORAL | 0 refills | Status: AC

## 2017-04-07 NOTE — Telephone Encounter (Signed)
Patient states that for the last week he has had his typical diverticulitis flare, LLQ pain. He did have to take Magnesium Citrate to have a bm. Patient will be heading on vacation next Friday and it is worse when sitting for long periods of time.

## 2017-04-07 NOTE — Telephone Encounter (Signed)
[name] we can give him a one week course of ciprofloxacin 500mg  BID and flagyl 500mg  TID for empiric treatment of diverticulitis and see if that helps. Thanks

## 2017-04-07 NOTE — Telephone Encounter (Signed)
Let patient know we are sending in new Rx's to his [facility].

## 2017-04-10 ENCOUNTER — Encounter (INDEPENDENT_AMBULATORY_CARE_PROVIDER_SITE_OTHER): Payer: Self-pay | Admitting: Orthopaedic Surgery

## 2017-04-10 ENCOUNTER — Ambulatory Visit (INDEPENDENT_AMBULATORY_CARE_PROVIDER_SITE_OTHER): Payer: PPO | Admitting: Orthopaedic Surgery

## 2017-04-10 DIAGNOSIS — M65341 Trigger finger, right ring finger: Secondary | ICD-10-CM

## 2017-04-10 NOTE — Progress Notes (Signed)
Post-Op Visit Note   Patient: [name]           Date of Birth: 22-Feb-1950           MRN: [id number] Visit Date: 04/10/2017 PCP: Silverio Decamp, MD   Assessment & Plan:  Chief Complaint:  Chief Complaint  Patient presents with  . Right Hand - Routine Post Op   Visit Diagnoses:  1. Trigger ring finger of right hand     Plan: [place] comes in for follow-up.  10 days status post right hand A1 pulley release long and ring fingers as well as right CMC cortisone injection, date of surgery 03/31/2017.  He has been doing excellent.  No fevers chills or any other systemic symptoms.  Minimal pain.  Examination of his right hand reveals well-healing surgical incisions without evidence of infection.  Full range of motion of the hand.  He is neurovascular intact distally.  Today, we will remove the stitches and applied Steri-Strips.  He will continue working on range of motion of the hand and fingers.  Follow-up with [place] in 4 to 5 weeks time for recheck, however if he is doing well he can call and let us know to cancel the appointment.  He will call with concerns or questions in the meantime.  Follow-Up Instructions: Return in about 1 month (around 05/08/2017).   Orders:  No orders of the defined types were placed in this encounter.  No orders of the defined types were placed in this encounter.   Imaging: No results found.  PMFS History: Patient Active Problem List   Diagnosis Date Noted  . Trigger ring finger of right hand 03/31/2017  . Hyperlipidemia, mixed 03/08/2017  . Squamous cell carcinoma in situ 09/05/2016  . Conjunctivitis 03/01/2016  . Generalized anxiety disorder 01/01/2016  . Postlaminectomy syndrome, lumbar region 11/16/2015  . Osteoarthritis of first carpometacarpal joint of right hand 11/16/2015  . Post-surgical erectile dysfunction 11/16/2015  . Diverticulosis 02/13/2015  . Benign paroxysmal positional vertigo 02/02/2015  . Annual physical exam  01/29/2015  . Essential hypertension, benign 01/29/2015  . Trigger middle finger of right hand 01/14/2015  . Arthritis 01/10/2014  . Malignant neoplasm of prostate ([place]) 02/13/2013   Past Medical History:  Diagnosis Date  . ADD (attention deficit disorder)   . Anxiety   . Arthritis 2016   bilateral hands  . Cancer ([place])    PROSTATE  . Diverticulitis   . Hyperlipidemia   . Hypertension   . Pain    HX OF 3 BACK SURGERIES - STILL HAS SEVERE BACK PAIN WITH PROLONGED SITTING OR STANDING  . PONV (postoperative nausea and vomiting)    NAUSEA AFTER SHOULDER SURGERY  . Reflux    MILD - NO DAILY MEDS - ANTIACID IF NEEDED    Family History  Problem Relation Age of Onset  . Heart attack Mother   . Hyperlipidemia Mother   . Hypertension Mother   . Stroke Mother   . Heart attack Father   . Hypertension Father   . Hyperlipidemia Father   . Diabetes Father   . Colon cancer Neg Hx   . Esophageal cancer Neg Hx   . Rectal cancer Neg Hx   . Stomach cancer Neg Hx     Past Surgical History:  Procedure Laterality Date  . [place], 2007  . COLON SURGERY  2008   COLON RESECTION FOR DIVERTICULITIS  . [place]  OR 1998   ING HERNIA REPAIR  . RIGHT SHOULDER 2013    . ROBOT ASSISTED LAPAROSCOPIC RADICAL PROSTATECTOMY N/A 02/13/2013   Procedure: ROBOTIC ASSISTED LAPAROSCOPIC RADICAL PROSTATECTOMY,, LYSIS OF PELVIC / INTRA-ABDOMINAL ADHESIONS;  Surgeon: Bernestine Amass, MD;  Location: [facility];  Service: Urology;  Laterality: N/A;   Social History   Occupational History  . Occupation: Retired  Tobacco Use  . Smoking status: Never Smoker  . Smokeless tobacco: Never Used  Substance and Sexual Activity  . Alcohol use: Yes    Comment: SOCIAL  . Drug use: No  . Sexual activity: Yes

## 2017-04-11 ENCOUNTER — Ambulatory Visit (INDEPENDENT_AMBULATORY_CARE_PROVIDER_SITE_OTHER): Payer: PPO | Admitting: Orthopaedic Surgery

## 2017-05-04 ENCOUNTER — Encounter: Payer: Self-pay | Admitting: Gastroenterology

## 2017-05-08 ENCOUNTER — Encounter (INDEPENDENT_AMBULATORY_CARE_PROVIDER_SITE_OTHER): Payer: Self-pay | Admitting: Orthopaedic Surgery

## 2017-05-08 ENCOUNTER — Ambulatory Visit (INDEPENDENT_AMBULATORY_CARE_PROVIDER_SITE_OTHER): Payer: PPO | Admitting: Orthopaedic Surgery

## 2017-05-08 DIAGNOSIS — M1811 Unilateral primary osteoarthritis of first carpometacarpal joint, right hand: Secondary | ICD-10-CM

## 2017-05-08 DIAGNOSIS — M65331 Trigger finger, right middle finger: Secondary | ICD-10-CM

## 2017-05-08 DIAGNOSIS — M65341 Trigger finger, right ring finger: Secondary | ICD-10-CM

## 2017-05-08 NOTE — Progress Notes (Signed)
Patient is 6 weeks status post trigger finger releases and right thumb CMC joint injection.  He no longer has triggering.  His long finger feels very good.  He does feel like he still has some discomfort in the ring finger but overall he is very happy.  His hand activity and strength are improving.  He still has pain related to his thumb CMC joint.  He declined a supportive brace.  He states that this point he can live with it.  He does not want any other interventions at this point.  From my standpoint he can follow-up as needed.

## 2017-05-09 ENCOUNTER — Encounter: Payer: Self-pay | Admitting: Sports Medicine

## 2017-05-09 DIAGNOSIS — Z0184 Encounter for antibody response examination: Secondary | ICD-10-CM

## 2017-05-19 ENCOUNTER — Encounter: Payer: Self-pay | Admitting: Gastroenterology

## 2017-05-19 ENCOUNTER — Other Ambulatory Visit: Payer: Self-pay

## 2017-05-19 ENCOUNTER — Other Ambulatory Visit (INDEPENDENT_AMBULATORY_CARE_PROVIDER_SITE_OTHER): Payer: PPO

## 2017-05-19 DIAGNOSIS — R1030 Lower abdominal pain, unspecified: Secondary | ICD-10-CM

## 2017-05-19 LAB — BUN: BUN: 14 mg/dL (ref 6–23)

## 2017-05-19 LAB — CREATININE, SERUM: CREATININE: 0.96 mg/dL (ref 0.40–1.50)

## 2017-05-19 MED ORDER — CIPROFLOXACIN HCL 500 MG PO TABS: 500.0000 mg | ORAL_TABLET | Freq: Two times a day (BID) | ORAL | 0 refills | Status: DC

## 2017-05-19 MED ORDER — METRONIDAZOLE 500 MG PO TABS: 500.0000 mg | ORAL_TABLET | Freq: Three times a day (TID) | ORAL | 0 refills | Status: AC

## 2017-05-22 ENCOUNTER — Ambulatory Visit (INDEPENDENT_AMBULATORY_CARE_PROVIDER_SITE_OTHER)
Admission: RE | Admit: 2017-05-22 | Discharge: 2017-05-22 | Disposition: A | Discharge status: Home / Self Care | Payer: PPO | Source: Ambulatory Visit | Attending: Gastroenterology | Admitting: Gastroenterology

## 2017-05-22 DIAGNOSIS — K5900 Constipation, unspecified: Secondary | ICD-10-CM | POA: Diagnosis not present

## 2017-05-22 DIAGNOSIS — R1030 Lower abdominal pain, unspecified: Secondary | ICD-10-CM | POA: Diagnosis not present

## 2017-05-22 DIAGNOSIS — R1032 Left lower quadrant pain: Secondary | ICD-10-CM | POA: Diagnosis not present

## 2017-05-22 MED ORDER — IOPAMIDOL (ISOVUE-300) INJECTION 61%
100.0000 mL | Freq: Once | INTRAVENOUS | Status: AC | PRN
  Administered 2017-05-22: 100 mL via INTRAVENOUS

## 2017-05-27 ENCOUNTER — Encounter: Payer: Self-pay | Admitting: Sports Medicine

## 2017-05-27 DIAGNOSIS — Z0000 Encounter for general adult medical examination without abnormal findings: Secondary | ICD-10-CM

## 2017-05-30 DIAGNOSIS — Z0000 Encounter for general adult medical examination without abnormal findings: Secondary | ICD-10-CM | POA: Diagnosis not present

## 2017-05-31 LAB — COMPREHENSIVE METABOLIC PANEL WITH GFR
Albumin: 4.4 g/dL (ref 3.6–5.1)
Alkaline phosphatase (APISO): 53 U/L (ref 40–115)
Chloride: 104 mmol/L (ref 98–110)
Creat: 0.96 mg/dL (ref 0.70–1.25)
Globulin: 2.3 g/dL (ref 1.9–3.7)

## 2017-05-31 LAB — LIPID PANEL W/REFLEX DIRECT LDL
Cholesterol: 189 mg/dL (ref <200)
HDL: 60 mg/dL (ref >40)
LDL Cholesterol (Calc): 102 mg/dL — ABNORMAL HIGH
Non-HDL Cholesterol (Calc): 129 mg/dL (calc) (ref <130)
Total CHOL/HDL Ratio: 3.2 (calc) (ref <5.0)
Triglycerides: 172 mg/dL — ABNORMAL HIGH (ref <150)

## 2017-05-31 LAB — COMPREHENSIVE METABOLIC PANEL
AG Ratio: 1.9 (calc) (ref 1.0–2.5)
ALT: 37 U/L (ref 9–46)
AST: 27 U/L (ref 10–35)
BUN: 13 mg/dL (ref 7–25)
CO2: 29 mmol/L (ref 20–32)
Calcium: 9.6 mg/dL (ref 8.6–10.3)
Glucose, Bld: 99 mg/dL (ref 65–99)
Potassium: 4.7 mmol/L (ref 3.5–5.3)
Sodium: 141 mmol/L (ref 135–146)
Total Bilirubin: 0.7 mg/dL (ref 0.2–1.2)
Total Protein: 6.7 g/dL (ref 6.1–8.1)

## 2017-06-01 ENCOUNTER — Other Ambulatory Visit: Payer: Self-pay | Admitting: Sports Medicine

## 2017-06-02 ENCOUNTER — Ambulatory Visit (INDEPENDENT_AMBULATORY_CARE_PROVIDER_SITE_OTHER): Payer: PPO | Admitting: Sports Medicine

## 2017-06-02 ENCOUNTER — Encounter: Payer: Self-pay | Admitting: Sports Medicine

## 2017-06-02 DIAGNOSIS — E782 Mixed hyperlipidemia: Secondary | ICD-10-CM | POA: Diagnosis not present

## 2017-06-02 MED ORDER — PRAVASTATIN SODIUM 40 MG PO TABS
40.0000 mg | ORAL_TABLET | Freq: Every day | ORAL | 3 refills | Status: DC
Start: 2017-06-02 — End: 2017-08-25

## 2017-06-02 MED ORDER — PITAVASTATIN CALCIUM 4 MG PO TABS: 1.0000 | ORAL_TABLET | Freq: Every day | ORAL | 3 refills | Status: DC

## 2017-06-02 NOTE — Progress Notes (Signed)
Subjective:    CC: Cholesterol  HPI: Ronalee Belts returns to discuss his lipids, he is currently on simvastatin 20 mg daily, he was having some myalgias at 39, we switched to bedtime use and dropped his dose, symptoms have improved slightly but still has discomfort.  I reviewed the past medical history, family history, social history, surgical history, and allergies today and no changes were needed.  Please see the problem list section below in epic for further details.  Past Medical History: Past Medical History:  Diagnosis Date  . ADD (attention deficit disorder)   . Anxiety   . Arthritis 2016   bilateral hands  . Cancer ([place])    PROSTATE  . Diverticulitis   . Hyperlipidemia   . Hypertension   . Pain    HX OF 3 BACK SURGERIES - STILL HAS SEVERE BACK PAIN WITH PROLONGED SITTING OR STANDING  . PONV (postoperative nausea and vomiting)    NAUSEA AFTER SHOULDER SURGERY  . Reflux    MILD - NO DAILY MEDS - ANTIACID IF NEEDED   Past Surgical History: Past Surgical History:  Procedure Laterality Date  . [place], 2007  . COLON SURGERY  2008   COLON RESECTION FOR DIVERTICULITIS  . HERNIA REPAIR  1997 OR 1998   ING HERNIA REPAIR  . RIGHT SHOULDER 2013    . ROBOT ASSISTED LAPAROSCOPIC RADICAL PROSTATECTOMY N/A 02/13/2013   Procedure: ROBOTIC ASSISTED LAPAROSCOPIC RADICAL PROSTATECTOMY,, LYSIS OF PELVIC / INTRA-ABDOMINAL ADHESIONS;  Surgeon: Bernestine Amass, MD;  Location: [facility];  Service: Urology;  Laterality: N/A;   Social History: Social History   Socioeconomic History  . Marital status: Married    Spouse name: [name]  . Number of children: 2  . Years of education: Not on file  . Highest education level: Not on file  Occupational History  . Occupation: Retired  Scientific laboratory technician  . Financial resource strain: Not on file  . Food insecurity:    Worry: Not on file    Inability: Not on file  . Transportation needs:    Medical: Not on file    Non-medical: Not  on file  Tobacco Use  . Smoking status: Never Smoker  . Smokeless tobacco: Never Used  Substance and Sexual Activity  . Alcohol use: Yes    Comment: SOCIAL  . Drug use: No  . Sexual activity: Yes  Lifestyle  . Physical activity:    Days per week: Not on file    Minutes per session: Not on file  . Stress: Not on file  Relationships  . Social connections:    Talks on phone: Not on file    Gets together: Not on file    Attends religious service: Not on file    Active member of club or organization: Not on file    Attends meetings of clubs or organizations: Not on file    Relationship status: Not on file  Other Topics Concern  . Not on file  Social History Narrative  . Not on file   Family History: Family History  Problem Relation Age of Onset  . Heart attack Mother   . Hyperlipidemia Mother   . Hypertension Mother   . Stroke Mother   . Heart attack Father   . Hypertension Father   . Hyperlipidemia Father   . Diabetes Father   . Colon cancer Neg Hx   . Esophageal cancer Neg Hx   . Rectal cancer Neg Hx   .  Stomach cancer Neg Hx    Allergies: No Known Allergies Medications: See med rec.  Review of Systems: No fevers, chills, night sweats, weight loss, chest pain, or shortness of breath.   Objective:    General: Well Developed, well nourished, and in no acute distress.  Neuro: Alert and oriented x3, extra-ocular muscles intact, sensation grossly intact.  HEENT: Normocephalic, atraumatic, pupils equal round reactive to light, neck supple, no masses, no lymphadenopathy, thyroid nonpalpable.  Skin: Warm and dry, no rashes. Cardiac: Regular rate and rhythm, no murmurs rubs or gallops, no lower extremity edema.  Respiratory: Clear to auscultation bilaterally. Not using accessory muscles, speaking in full sentences.  Impression and Recommendations:    Hyperlipidemia, mixed Lipids are under okay control, but still getting myalgias with simvastatin, switching to  pravastatin 40 mg at bedtime. He will also increase his over-the-counter fish oil to twice a day. I am going to just write him a prescription and printed out for Livalo with a discount coupon attached, he tells me he is not using any Medicare. Recheck lipids in 2 months, if still having myalgias he can simply fill the Livalo.  I spent 25 minutes with this patient, greater than 50% was face-to-face time counseling regarding the above diagnoses including risks and benefits of statins as well as future treatment options. ___________________________________________ Gwen Her. Dianah Field, M.D., ABFM., CAQSM. Primary Care and [place] Instructor of [place] of [facility] of Medicine

## 2017-06-02 NOTE — Assessment & Plan Note (Signed)
Lipids are under okay control, but still getting myalgias with simvastatin, switching to pravastatin 40 mg at bedtime. He will also increase his over-the-counter fish oil to twice a day. I am going to just write him a prescription and printed out for Livalo with a discount coupon attached, he tells me he is not using any Medicare. Recheck lipids in 2 months, if still having myalgias he can simply fill the Livalo.

## 2017-06-12 ENCOUNTER — Encounter: Payer: Self-pay | Admitting: Sports Medicine

## 2017-07-07 ENCOUNTER — Encounter: Payer: Self-pay | Admitting: Sports Medicine

## 2017-07-07 MED ORDER — CLOTRIMAZOLE-BETAMETHASONE 1-0.05 % EX CREA: 1.0000 "application " | TOPICAL_CREAM | Freq: Two times a day (BID) | CUTANEOUS | 0 refills | Status: DC

## 2017-08-16 ENCOUNTER — Other Ambulatory Visit: Payer: Self-pay | Admitting: Sports Medicine

## 2017-08-21 ENCOUNTER — Encounter: Payer: Self-pay | Admitting: Sports Medicine

## 2017-08-21 MED ORDER — VALSARTAN 160 MG PO TABS: 160.0000 mg | ORAL_TABLET | Freq: Every day | ORAL | 3 refills | Status: DC

## 2017-08-25 ENCOUNTER — Encounter: Payer: Self-pay | Admitting: Sports Medicine

## 2017-08-25 ENCOUNTER — Ambulatory Visit (INDEPENDENT_AMBULATORY_CARE_PROVIDER_SITE_OTHER): Payer: PPO | Admitting: Sports Medicine

## 2017-08-25 DIAGNOSIS — F411 Generalized anxiety disorder: Secondary | ICD-10-CM

## 2017-08-25 DIAGNOSIS — I10 Essential (primary) hypertension: Secondary | ICD-10-CM

## 2017-08-25 DIAGNOSIS — Z23 Encounter for immunization: Secondary | ICD-10-CM | POA: Diagnosis not present

## 2017-08-25 DIAGNOSIS — L719 Rosacea, unspecified: Secondary | ICD-10-CM

## 2017-08-25 DIAGNOSIS — E782 Mixed hyperlipidemia: Secondary | ICD-10-CM

## 2017-08-25 DIAGNOSIS — M1811 Unilateral primary osteoarthritis of first carpometacarpal joint, right hand: Secondary | ICD-10-CM | POA: Diagnosis not present

## 2017-08-25 MED ORDER — METRONIDAZOLE 0.75 % EX GEL
1.0000 | Freq: Two times a day (BID) | CUTANEOUS | 3 refills | Status: DC
Start: 2017-08-25 — End: 2017-08-25

## 2017-08-25 MED ORDER — METRONIDAZOLE 0.75 % EX GEL: 1.0000 | Freq: Two times a day (BID) | CUTANEOUS | 3 refills | Status: DC

## 2017-08-25 MED ORDER — DULOXETINE HCL 30 MG PO CPEP: 30.0000 mg | ORAL_CAPSULE | Freq: Every day | ORAL | 3 refills | Status: DC

## 2017-08-25 NOTE — Assessment & Plan Note (Signed)
Continued BuSpar. Starting Cymbalta 30, return monthly for PHQ and GAD.

## 2017-08-25 NOTE — Assessment & Plan Note (Signed)
Discontinued pravastatin due to myalgias and intolerance. We can recheck lipids in a couple of months. Increase fish and fish oil and diet.

## 2017-08-25 NOTE — Assessment & Plan Note (Signed)
With significant anxiety, pain from his hand and back. We did switch to valsartan, he will keep close follow-up.

## 2017-08-25 NOTE — Assessment & Plan Note (Signed)
Erythro-telangiectatic subtype. Adding topical metronidazole. If insufficient relief after a few months we will switch to topical Finacea.

## 2017-08-25 NOTE — Progress Notes (Signed)
inf

## 2017-08-25 NOTE — Progress Notes (Signed)
Subjective:    CC: Multiple issues  HPI: Anxiety: Is no longer taking his BuSpar.  Significant uneasiness, anxious mood, mild depression.  No suicidal or homicidal ideation.  Hand pain: Known trapeziometacarpal joint osteoarthritis, desires repeat injection today, oral analgesics are not effective, pain is at the base of the thumb.  Hypertension: Somewhat anxious and in pain today, no headaches, visual changes, chest pain.  Is just now going to be starting his new blood pressure medication.  Skin rash: Present over the nose but also over the cheeks, there do appear to be multiple small spider web like blood vessels.  I reviewed the past medical history, family history, social history, surgical history, and allergies today and no changes were needed.  Please see the problem list section below in epic for further details.  Past Medical History: Past Medical History:  Diagnosis Date  . ADD (attention deficit disorder)   . Anxiety   . Arthritis 2016   bilateral hands  . Cancer ([place])    PROSTATE  . Diverticulitis   . Hyperlipidemia   . Hypertension   . Pain    HX OF 3 BACK SURGERIES - STILL HAS SEVERE BACK PAIN WITH PROLONGED SITTING OR STANDING  . PONV (postoperative nausea and vomiting)    NAUSEA AFTER SHOULDER SURGERY  . Reflux    MILD - NO DAILY MEDS - ANTIACID IF NEEDED   Past Surgical History: Past Surgical History:  Procedure Laterality Date  . [place], 2007  . COLON SURGERY  2008   COLON RESECTION FOR DIVERTICULITIS  . HERNIA REPAIR  1997 OR 1998   ING HERNIA REPAIR  . RIGHT SHOULDER 2013    . ROBOT ASSISTED LAPAROSCOPIC RADICAL PROSTATECTOMY N/A 02/13/2013   Procedure: ROBOTIC ASSISTED LAPAROSCOPIC RADICAL PROSTATECTOMY,, LYSIS OF PELVIC / INTRA-ABDOMINAL ADHESIONS;  Surgeon: Bernestine Amass, MD;  Location: [facility];  Service: Urology;  Laterality: N/A;   Social History: Social History   Socioeconomic History  . Marital status: Married   Spouse name: [name]  . Number of children: 2  . Years of education: Not on file  . Highest education level: Not on file  Occupational History  . Occupation: Retired  Scientific laboratory technician  . Financial resource strain: Not on file  . Food insecurity:    Worry: Not on file    Inability: Not on file  . Transportation needs:    Medical: Not on file    Non-medical: Not on file  Tobacco Use  . Smoking status: Never Smoker  . Smokeless tobacco: Never Used  Substance and Sexual Activity  . Alcohol use: Yes    Comment: SOCIAL  . Drug use: No  . Sexual activity: Yes  Lifestyle  . Physical activity:    Days per week: Not on file    Minutes per session: Not on file  . Stress: Not on file  Relationships  . Social connections:    Talks on phone: Not on file    Gets together: Not on file    Attends religious service: Not on file    Active member of club or organization: Not on file    Attends meetings of clubs or organizations: Not on file    Relationship status: Not on file  Other Topics Concern  . Not on file  Social History Narrative  . Not on file   Family History: Family History  Problem Relation Age of Onset  . Heart attack Mother   .  Hyperlipidemia Mother   . Hypertension Mother   . Stroke Mother   . Heart attack Father   . Hypertension Father   . Hyperlipidemia Father   . Diabetes Father   . Colon cancer Neg Hx   . Esophageal cancer Neg Hx   . Rectal cancer Neg Hx   . Stomach cancer Neg Hx    Allergies: No Known Allergies Medications: See med rec.  Review of Systems: No fevers, chills, night sweats, weight loss, chest pain, or shortness of breath.   Objective:    General: Well Developed, well nourished, and in no acute distress.  Neuro: Alert and oriented x3, extra-ocular muscles intact, sensation grossly intact.  HEENT: Normocephalic, atraumatic, pupils equal round reactive to light, neck supple, no masses, no lymphadenopathy, thyroid nonpalpable.  Skin: Warm and  dry, no rashes.  Visible erythro-telangiectatic rosacea on the nose and cheeks Cardiac: Regular rate and rhythm, no murmurs rubs or gallops, no lower extremity edema.  Respiratory: Clear to auscultation bilaterally. Not using accessory muscles, speaking in full sentences. Right hand: Tender to palpation at the thumb basal joint.  Procedure: Real-time Ultrasound Guided Injection of right thumb basal joint Device: GE Logiq E  Verbal informed consent obtained.  Time-out conducted.  Noted no overlying erythema, induration, or other signs of local infection.  Skin prepped in a sterile fashion.  Local anesthesia: Topical Ethyl chloride.  With sterile technique and under real time ultrasound guidance: Noted severe arthritis, 25-gauge needle advanced into the joint, injected 1/2 cc Kenalog 40, 1/2 cc lidocaine Completed without difficulty  Pain immediately resolved suggesting accurate placement of the medication.  Advised to call if fevers/chills, erythema, induration, drainage, or persistent bleeding.  Images permanently stored and available for review in the ultrasound unit.  Impression: Technically successful ultrasound guided injection.  Impression and Recommendations:    Osteoarthritis of first carpometacarpal joint of right hand Right first MCP injection.  Hyperlipidemia, mixed Discontinued pravastatin due to myalgias and intolerance. We can recheck lipids in a couple of months. Increase fish and fish oil and diet.   Erythro-telangiectatic rosacea Erythro-telangiectatic subtype. Adding topical metronidazole. If insufficient relief after a few months we will switch to topical Finacea.  Generalized anxiety disorder Continued BuSpar. Starting Cymbalta 30, return monthly for PHQ and GAD.  Essential hypertension, benign With significant anxiety, pain from his hand and back. We did switch to valsartan, he will keep close follow-up. ___________________________________________ Gwen Her. Dianah Field, M.D., ABFM., CAQSM. Primary Care and [place] Instructor of [place] of [facility] of Medicine

## 2017-08-25 NOTE — Assessment & Plan Note (Signed)
Right first MCP injection.

## 2017-08-28 ENCOUNTER — Encounter: Payer: Self-pay | Admitting: Sports Medicine

## 2017-09-07 DIAGNOSIS — M961 Postlaminectomy syndrome, not elsewhere classified: Secondary | ICD-10-CM | POA: Diagnosis not present

## 2017-09-07 DIAGNOSIS — M5416 Radiculopathy, lumbar region: Secondary | ICD-10-CM | POA: Diagnosis not present

## 2017-09-15 ENCOUNTER — Encounter: Payer: Self-pay | Admitting: Sports Medicine

## 2017-09-16 ENCOUNTER — Other Ambulatory Visit: Payer: Self-pay | Admitting: Sports Medicine

## 2017-09-16 DIAGNOSIS — F411 Generalized anxiety disorder: Secondary | ICD-10-CM

## 2017-09-18 ENCOUNTER — Encounter: Payer: Self-pay | Admitting: Sports Medicine

## 2017-09-19 DIAGNOSIS — E782 Mixed hyperlipidemia: Secondary | ICD-10-CM | POA: Diagnosis not present

## 2017-09-19 LAB — LIPID PANEL W/REFLEX DIRECT LDL
Cholesterol: 268 mg/dL — ABNORMAL HIGH (ref <200)
HDL: 56 mg/dL (ref >40)
LDL Cholesterol (Calc): 183 mg/dL (calc) — ABNORMAL HIGH
Non-HDL Cholesterol (Calc): 212 mg/dL — ABNORMAL HIGH (ref <130)
Total CHOL/HDL Ratio: 4.8 (calc) (ref <5.0)
Triglycerides: 144 mg/dL (ref <150)

## 2017-09-22 ENCOUNTER — Ambulatory Visit (INDEPENDENT_AMBULATORY_CARE_PROVIDER_SITE_OTHER): Payer: PPO | Admitting: Sports Medicine

## 2017-09-22 DIAGNOSIS — C61 Malignant neoplasm of prostate: Secondary | ICD-10-CM | POA: Diagnosis not present

## 2017-09-22 DIAGNOSIS — E782 Mixed hyperlipidemia: Secondary | ICD-10-CM

## 2017-09-22 DIAGNOSIS — F411 Generalized anxiety disorder: Secondary | ICD-10-CM

## 2017-09-22 MED ORDER — SERTRALINE HCL 25 MG PO TABS
25.0000 mg | ORAL_TABLET | Freq: Every day | ORAL | 2 refills | Status: DC
Start: 2017-09-22 — End: 2017-10-14

## 2017-09-22 MED ORDER — ALPRAZOLAM 0.25 MG PO TABS: 0.2500 mg | ORAL_TABLET | Freq: Three times a day (TID) | ORAL | 0 refills | Status: DC | PRN

## 2017-09-22 MED ORDER — FENOFIBRATE 160 MG PO TABS: 160.0000 mg | ORAL_TABLET | Freq: Every day | ORAL | 3 refills | Status: DC

## 2017-09-22 NOTE — Assessment & Plan Note (Signed)
History of radical prostatectomy, we will check yearly PSA test. Orders placed.  We will try to added onto the blood already in the lab from 2 days ago.

## 2017-09-22 NOTE — Assessment & Plan Note (Signed)
LDL still elevated at 180. Multiple statins have resulted in myalgias and intolerance. Trying fenofibrate for 2 months then recheck lipids. If persistently elevated we will switch to Repatha.

## 2017-09-22 NOTE — Progress Notes (Signed)
Subjective:    CC: Several issues  HPI: [name] returns, we started Cymbalta at the last visit for anxiety and depression, he noted severe anorgasmia and would like to try something different.  No suicidal or homicidal ideation.  Hypertension: Well-controlled.  Hyperlipidemia: Intolerant now to all statins, Livalo was not affordable.  We discussed Repatha and fenofibrate.  History of prostate cancer: Needs PSA checked.  He is post radical prostatectomy.  I reviewed the past medical history, family history, social history, surgical history, and allergies today and no changes were needed.  Please see the problem list section below in epic for further details.  Past Medical History: Past Medical History:  Diagnosis Date  . ADD (attention deficit disorder)   . Anxiety   . Arthritis 2016   bilateral hands  . Cancer ([place])    PROSTATE  . Diverticulitis   . Hyperlipidemia   . Hypertension   . Pain    HX OF 3 BACK SURGERIES - STILL HAS SEVERE BACK PAIN WITH PROLONGED SITTING OR STANDING  . PONV (postoperative nausea and vomiting)    NAUSEA AFTER SHOULDER SURGERY  . Reflux    MILD - NO DAILY MEDS - ANTIACID IF NEEDED   Past Surgical History: Past Surgical History:  Procedure Laterality Date  . [place], 2007  . COLON SURGERY  2008   COLON RESECTION FOR DIVERTICULITIS  . HERNIA REPAIR  1997 OR 1998   ING HERNIA REPAIR  . RIGHT SHOULDER 2013    . ROBOT ASSISTED LAPAROSCOPIC RADICAL PROSTATECTOMY N/A 02/13/2013   Procedure: ROBOTIC ASSISTED LAPAROSCOPIC RADICAL PROSTATECTOMY,, LYSIS OF PELVIC / INTRA-ABDOMINAL ADHESIONS;  Surgeon: Bernestine Amass, MD;  Location: [facility];  Service: Urology;  Laterality: N/A;   Social History: Social History   Socioeconomic History  . Marital status: Married    Spouse name: [name]  . Number of children: 2  . Years of education: Not on file  . Highest education level: Not on file  Occupational History  . Occupation: Retired   Scientific laboratory technician  . Financial resource strain: Not on file  . Food insecurity:    Worry: Not on file    Inability: Not on file  . Transportation needs:    Medical: Not on file    Non-medical: Not on file  Tobacco Use  . Smoking status: Never Smoker  . Smokeless tobacco: Never Used  Substance and Sexual Activity  . Alcohol use: Yes    Comment: SOCIAL  . Drug use: No  . Sexual activity: Yes  Lifestyle  . Physical activity:    Days per week: Not on file    Minutes per session: Not on file  . Stress: Not on file  Relationships  . Social connections:    Talks on phone: Not on file    Gets together: Not on file    Attends religious service: Not on file    Active member of club or organization: Not on file    Attends meetings of clubs or organizations: Not on file    Relationship status: Not on file  Other Topics Concern  . Not on file  Social History Narrative  . Not on file   Family History: Family History  Problem Relation Age of Onset  . Heart attack Mother   . Hyperlipidemia Mother   . Hypertension Mother   . Stroke Mother   . Heart attack Father   . Hypertension Father   . Hyperlipidemia  Father   . Diabetes Father   . Colon cancer Neg Hx   . Esophageal cancer Neg Hx   . Rectal cancer Neg Hx   . Stomach cancer Neg Hx    Allergies: No Known Allergies Medications: See med rec.  Review of Systems: No fevers, chills, night sweats, weight loss, chest pain, or shortness of breath.   Objective:    General: Well Developed, well nourished, and in no acute distress.  Neuro: Alert and oriented x3, extra-ocular muscles intact, sensation grossly intact.  HEENT: Normocephalic, atraumatic, pupils equal round reactive to light, neck supple, no masses, no lymphadenopathy, thyroid nonpalpable.  Skin: Warm and dry, no rashes. Cardiac: Regular rate and rhythm, no murmurs rubs or gallops, no lower extremity edema.  Respiratory: Clear to auscultation bilaterally. Not using  accessory muscles, speaking in full sentences.  Impression and Recommendations:    Generalized anxiety disorder Did not respond well to Cymbalta, had excessive anorgasmia. Trying sertraline with low-dose of alprazolam to use as needed. Return in 4 weeks for repeat PHQ and GAD. Certainly we could try Viibryd or Trintellix if sertraline causes the side effects as well.  Malignant neoplasm of prostate History of radical prostatectomy, we will check yearly PSA test. Orders placed.  We will try to added onto the blood already in the lab from 2 days ago.  Hyperlipidemia, mixed LDL still elevated at 180. Multiple statins have resulted in myalgias and intolerance. Trying fenofibrate for 2 months then recheck lipids. If persistently elevated we will switch to Repatha. ___________________________________________ Gwen Her. Dianah Field, M.D., ABFM., CAQSM. Primary Care and [place] Instructor of [place] of [facility] of Medicine

## 2017-09-22 NOTE — Assessment & Plan Note (Signed)
Did not respond well to Cymbalta, had excessive anorgasmia. Trying sertraline with low-dose of alprazolam to use as needed. Return in 4 weeks for repeat PHQ and GAD. Certainly we could try Viibryd or Trintellix if sertraline causes the side effects as well.

## 2017-09-25 LAB — PSA, TOTAL AND FREE
PSA, % Free: UNDETERMINED % (calc) (ref >25)
PSA, Free: <0.1 ng/mL
PSA, Total: <0.1 ng/mL (ref <=4.0)

## 2017-09-26 ENCOUNTER — Encounter: Payer: Self-pay | Admitting: Sports Medicine

## 2017-09-30 ENCOUNTER — Other Ambulatory Visit: Payer: Self-pay | Admitting: Sports Medicine

## 2017-10-03 ENCOUNTER — Encounter: Payer: Self-pay | Admitting: Sports Medicine

## 2017-10-09 ENCOUNTER — Encounter: Payer: Self-pay | Admitting: Sports Medicine

## 2017-10-09 DIAGNOSIS — F411 Generalized anxiety disorder: Secondary | ICD-10-CM

## 2017-10-09 MED ORDER — ALPRAZOLAM 0.25 MG PO TABS: 0.2500 mg | ORAL_TABLET | Freq: Three times a day (TID) | ORAL | 0 refills | Status: DC | PRN

## 2017-10-09 NOTE — Addendum Note (Signed)
Addended by: Silverio Decamp on: 10/09/2017 10:48 AM   Modules accepted: Orders

## 2017-10-14 ENCOUNTER — Other Ambulatory Visit: Payer: Self-pay | Admitting: Sports Medicine

## 2017-10-14 DIAGNOSIS — F411 Generalized anxiety disorder: Secondary | ICD-10-CM

## 2017-10-15 ENCOUNTER — Encounter: Payer: Self-pay | Admitting: Sports Medicine

## 2017-10-25 ENCOUNTER — Ambulatory Visit (INDEPENDENT_AMBULATORY_CARE_PROVIDER_SITE_OTHER): Payer: PPO | Admitting: Sports Medicine

## 2017-10-25 DIAGNOSIS — E782 Mixed hyperlipidemia: Secondary | ICD-10-CM | POA: Diagnosis not present

## 2017-10-25 DIAGNOSIS — F411 Generalized anxiety disorder: Secondary | ICD-10-CM

## 2017-10-25 DIAGNOSIS — L570 Actinic keratosis: Secondary | ICD-10-CM | POA: Diagnosis not present

## 2017-10-25 DIAGNOSIS — I788 Other diseases of capillaries: Secondary | ICD-10-CM | POA: Diagnosis not present

## 2017-10-25 MED ORDER — SERTRALINE HCL 50 MG PO TABS: 50.0000 mg | ORAL_TABLET | Freq: Every day | ORAL | 3 refills | Status: DC

## 2017-10-25 NOTE — Progress Notes (Addendum)
Subjective:    CC: Follow-up  HPI: Failure of Cymbalta, after 5 weeks now of sertraline he is starting to feel some efficacy, no suicidal or homicidal ideation, increasing to 50 mg.  I reviewed the past medical history, family history, social history, surgical history, and allergies today and no changes were needed.  Please see the problem list section below in epic for further details.  Past Medical History: Past Medical History:  Diagnosis Date  . ADD (attention deficit disorder)   . Anxiety   . Arthritis 2016   bilateral hands  . Cancer ([place])    PROSTATE  . Diverticulitis   . Hyperlipidemia   . Hypertension   . Pain    HX OF 3 BACK SURGERIES - STILL HAS SEVERE BACK PAIN WITH PROLONGED SITTING OR STANDING  . PONV (postoperative nausea and vomiting)    NAUSEA AFTER SHOULDER SURGERY  . Reflux    MILD - NO DAILY MEDS - ANTIACID IF NEEDED   Past Surgical History: Past Surgical History:  Procedure Laterality Date  . [place], 2007  . COLON SURGERY  2008   COLON RESECTION FOR DIVERTICULITIS  . HERNIA REPAIR  1997 OR 1998   ING HERNIA REPAIR  . RIGHT SHOULDER 2013    . ROBOT ASSISTED LAPAROSCOPIC RADICAL PROSTATECTOMY N/A 02/13/2013   Procedure: ROBOTIC ASSISTED LAPAROSCOPIC RADICAL PROSTATECTOMY,, LYSIS OF PELVIC / INTRA-ABDOMINAL ADHESIONS;  Surgeon: Bernestine Amass, MD;  Location: [facility];  Service: Urology;  Laterality: N/A;   Social History: Social History   Socioeconomic History  . Marital status: Married    Spouse name: [name]  . Number of children: 2  . Years of education: Not on file  . Highest education level: Not on file  Occupational History  . Occupation: Retired  Scientific laboratory technician  . Financial resource strain: Not on file  . Food insecurity:    Worry: Not on file    Inability: Not on file  . Transportation needs:    Medical: Not on file    Non-medical: Not on file  Tobacco Use  . Smoking status: Never Smoker  . Smokeless tobacco:  Never Used  Substance and Sexual Activity  . Alcohol use: Yes    Comment: SOCIAL  . Drug use: No  . Sexual activity: Yes  Lifestyle  . Physical activity:    Days per week: Not on file    Minutes per session: Not on file  . Stress: Not on file  Relationships  . Social connections:    Talks on phone: Not on file    Gets together: Not on file    Attends religious service: Not on file    Active member of club or organization: Not on file    Attends meetings of clubs or organizations: Not on file    Relationship status: Not on file  Other Topics Concern  . Not on file  Social History Narrative  . Not on file   Family History: Family History  Problem Relation Age of Onset  . Heart attack Mother   . Hyperlipidemia Mother   . Hypertension Mother   . Stroke Mother   . Heart attack Father   . Hypertension Father   . Hyperlipidemia Father   . Diabetes Father   . Colon cancer Neg Hx   . Esophageal cancer Neg Hx   . Rectal cancer Neg Hx   . Stomach cancer Neg Hx    Allergies: No Known  Allergies Medications: See med rec.  Review of Systems: No fevers, chills, night sweats, weight loss, chest pain, or shortness of breath.   Objective:    General: Well Developed, well nourished, and in no acute distress.  Neuro: Alert and oriented x3, extra-ocular muscles intact, sensation grossly intact.  HEENT: Normocephalic, atraumatic, pupils equal round reactive to light, neck supple, no masses, no lymphadenopathy, thyroid nonpalpable.  Skin: Warm and dry, no rashes. Cardiac: Regular rate and rhythm, no murmurs rubs or gallops, no lower extremity edema.  Respiratory: Clear to auscultation bilaterally. Not using accessory muscles, speaking in full sentences.  Impression and Recommendations:    Generalized anxiety disorder Starting to feel a good response with sertraline, continues with low-dose alprazolam as needed. Increasing sertraline. We can certainly try Viibryd or Trintellix if  he has side effects with sertraline. Return in 4-6 weeks for PHQ and GAD.  Hyperlipidemia, mixed Much better with fenofibrate.  Slight LFT elevation, we can track this in a month to make sure its trending down.  I spent 25 minutes with this patient, greater than 50% was face-to-face time counseling regarding the above diagnoses, specifically the mechanism and require time for efficacy of SSRIs. ___________________________________________ Gwen Her. Dianah Field, M.D., ABFM., CAQSM. Primary Care and Sports Medicine [facility]  Adjunct Professor of [place] of [facility] of Medicine

## 2017-10-25 NOTE — Assessment & Plan Note (Signed)
Starting to feel a good response with sertraline, continues with low-dose alprazolam as needed. Increasing sertraline. We can certainly try Viibryd or Trintellix if he has side effects with sertraline. Return in 4-6 weeks for PHQ and GAD.

## 2017-10-30 ENCOUNTER — Encounter: Payer: Self-pay | Admitting: Sports Medicine

## 2017-11-03 ENCOUNTER — Encounter: Payer: Self-pay | Admitting: Sports Medicine

## 2017-11-06 ENCOUNTER — Other Ambulatory Visit: Payer: Self-pay | Admitting: Sports Medicine

## 2017-11-15 ENCOUNTER — Encounter: Payer: Self-pay | Admitting: Sports Medicine

## 2017-11-15 DIAGNOSIS — I10 Essential (primary) hypertension: Secondary | ICD-10-CM

## 2017-11-15 DIAGNOSIS — E782 Mixed hyperlipidemia: Secondary | ICD-10-CM

## 2017-11-15 NOTE — Telephone Encounter (Signed)
Thank you so much for placing the orders!!! :-D

## 2017-11-17 ENCOUNTER — Encounter: Payer: Self-pay | Admitting: Sports Medicine

## 2017-11-17 DIAGNOSIS — E782 Mixed hyperlipidemia: Secondary | ICD-10-CM | POA: Diagnosis not present

## 2017-11-17 DIAGNOSIS — I10 Essential (primary) hypertension: Secondary | ICD-10-CM | POA: Diagnosis not present

## 2017-11-17 LAB — COMPREHENSIVE METABOLIC PANEL WITH GFR
AST: 47 U/L — ABNORMAL HIGH (ref 10–35)
Alkaline phosphatase (APISO): 55 U/L (ref 40–115)
BUN: 14 mg/dL (ref 7–25)
Chloride: 104 mmol/L (ref 98–110)
Creat: 1.12 mg/dL (ref 0.70–1.25)
Globulin: 2.3 g/dL (ref 1.9–3.7)
Potassium: 5.1 mmol/L (ref 3.5–5.3)

## 2017-11-17 LAB — COMPREHENSIVE METABOLIC PANEL
AG Ratio: 2 (calc) (ref 1.0–2.5)
ALT: 74 U/L — ABNORMAL HIGH (ref 9–46)
Albumin: 4.5 g/dL (ref 3.6–5.1)
CO2: 32 mmol/L (ref 20–32)
Calcium: 9.5 mg/dL (ref 8.6–10.3)
Glucose, Bld: 111 mg/dL — ABNORMAL HIGH (ref 65–99)
Sodium: 142 mmol/L (ref 135–146)
Total Bilirubin: 0.5 mg/dL (ref 0.2–1.2)
Total Protein: 6.8 g/dL (ref 6.1–8.1)

## 2017-11-17 LAB — CBC
HCT: 45.8 % (ref 38.5–50.0)
Hemoglobin: 15.5 g/dL (ref 13.2–17.1)
MCH: 31.4 pg (ref 27.0–33.0)
MCHC: 33.8 g/dL (ref 32.0–36.0)
MCV: 92.9 fL (ref 80.0–100.0)
MPV: 10.3 fL (ref 7.5–12.5)
Platelets: 324 Thousand/uL (ref 140–400)
RBC: 4.93 10*6/uL (ref 4.20–5.80)
RDW: 11.9 % (ref 11.0–15.0)
WBC: 5.9 10*3/uL (ref 3.8–10.8)

## 2017-11-17 LAB — LIPID PANEL W/REFLEX DIRECT LDL
Cholesterol: 209 mg/dL — ABNORMAL HIGH (ref <200)
HDL: 57 mg/dL (ref >40)
LDL Cholesterol (Calc): 129 mg/dL (calc) — ABNORMAL HIGH
Non-HDL Cholesterol (Calc): 152 mg/dL (calc) — ABNORMAL HIGH (ref <130)
Total CHOL/HDL Ratio: 3.7 (calc) (ref <5.0)
Triglycerides: 115 mg/dL (ref <150)

## 2017-11-18 NOTE — Assessment & Plan Note (Signed)
Much better with fenofibrate.  Slight LFT elevation, we can track this in a month to make sure its trending down.

## 2017-11-20 ENCOUNTER — Other Ambulatory Visit: Payer: Self-pay | Admitting: Sports Medicine

## 2017-11-20 DIAGNOSIS — F411 Generalized anxiety disorder: Secondary | ICD-10-CM

## 2017-11-21 LAB — HM DIABETES EYE EXAM

## 2017-11-22 ENCOUNTER — Encounter: Payer: Self-pay | Admitting: Sports Medicine

## 2017-11-22 ENCOUNTER — Ambulatory Visit (INDEPENDENT_AMBULATORY_CARE_PROVIDER_SITE_OTHER): Payer: PPO | Admitting: Sports Medicine

## 2017-11-22 DIAGNOSIS — M1811 Unilateral primary osteoarthritis of first carpometacarpal joint, right hand: Secondary | ICD-10-CM

## 2017-11-22 DIAGNOSIS — E782 Mixed hyperlipidemia: Secondary | ICD-10-CM

## 2017-11-22 DIAGNOSIS — F411 Generalized anxiety disorder: Secondary | ICD-10-CM | POA: Diagnosis not present

## 2017-11-22 DIAGNOSIS — I10 Essential (primary) hypertension: Secondary | ICD-10-CM | POA: Diagnosis not present

## 2017-11-22 LAB — COMPREHENSIVE METABOLIC PANEL
AG Ratio: 1.9 (calc) (ref 1.0–2.5)
AST: 42 U/L — ABNORMAL HIGH (ref 10–35)
Alkaline phosphatase (APISO): 51 U/L (ref 40–115)
CO2: 30 mmol/L (ref 20–32)
Chloride: 108 mmol/L (ref 98–110)
Globulin: 2.3 g/dL (calc) (ref 1.9–3.7)
Potassium: 5.3 mmol/L (ref 3.5–5.3)
Total Bilirubin: 0.4 mg/dL (ref 0.2–1.2)

## 2017-11-22 LAB — COMPREHENSIVE METABOLIC PANEL WITH GFR
ALT: 62 U/L — ABNORMAL HIGH (ref 9–46)
Albumin: 4.3 g/dL (ref 3.6–5.1)
BUN: 14 mg/dL (ref 7–25)
Calcium: 9.6 mg/dL (ref 8.6–10.3)
Creat: 1.11 mg/dL (ref 0.70–1.25)
Glucose, Bld: 73 mg/dL (ref 65–99)
Sodium: 145 mmol/L (ref 135–146)
Total Protein: 6.6 g/dL (ref 6.1–8.1)

## 2017-11-22 MED ORDER — AMITRIPTYLINE HCL 25 MG PO TABS: 25.0000 mg | ORAL_TABLET | Freq: Every day | ORAL | 3 refills | Status: DC

## 2017-11-22 MED ORDER — SERTRALINE HCL 25 MG PO TABS: 25.0000 mg | ORAL_TABLET | Freq: Every day | ORAL | 3 refills | Status: DC

## 2017-11-22 MED ORDER — FENOFIBRATE 160 MG PO TABS: 160.0000 mg | ORAL_TABLET | Freq: Every day | ORAL | 3 refills | Status: DC

## 2017-11-22 MED ORDER — ASPIRIN 81 MG PO TABS: 81.0000 mg | ORAL_TABLET | Freq: Every day | ORAL | 3 refills | Status: DC

## 2017-11-22 MED ORDER — VALSARTAN 160 MG PO TABS: 160.0000 mg | ORAL_TABLET | Freq: Every day | ORAL | 3 refills | Status: DC

## 2017-11-22 NOTE — Assessment & Plan Note (Signed)
Repeat injection today, previous injection was about 3 months ago.

## 2017-11-22 NOTE — Assessment & Plan Note (Signed)
Beautifully controlled, no change in plan.

## 2017-11-22 NOTE — Assessment & Plan Note (Signed)
Drop back down to 25 of sertraline, as well as low-dose alprazolam as needed, doing well. We can certainly try Viibryd or Trintellix if he has adverse effects with sertraline.

## 2017-11-22 NOTE — Progress Notes (Signed)
Subjective:    CC: Couple of issues  HPI: Hypertension: Well-controlled  Generalized anxiety: Drop back down to 25 of sertraline, with an occasional alprazolam his symptoms are well controlled.  Right CMC arthritis: Due for another injection, did extremely well after the last one.  Hyperlipidemia: Intolerant to all statins including Livalo, we switched to fenofibrate and he has done well, lipids have responded well.  He did have a slight bump in his LFTs.  He did just order the new C8 Corvette.  I reviewed the past medical history, family history, social history, surgical history, and allergies today and no changes were needed.  Please see the problem list section below in epic for further details.  Past Medical History: Past Medical History:  Diagnosis Date  . ADD (attention deficit disorder)   . Anxiety   . Arthritis 2016   bilateral hands  . Cancer ([place])    PROSTATE  . Diverticulitis   . Hyperlipidemia   . Hypertension   . Pain    HX OF 3 BACK SURGERIES - STILL HAS SEVERE BACK PAIN WITH PROLONGED SITTING OR STANDING  . PONV (postoperative nausea and vomiting)    NAUSEA AFTER SHOULDER SURGERY  . Reflux    MILD - NO DAILY MEDS - ANTIACID IF NEEDED   Past Surgical History: Past Surgical History:  Procedure Laterality Date  . [place], 2007  . COLON SURGERY  2008   COLON RESECTION FOR DIVERTICULITIS  . HERNIA REPAIR  1997 OR 1998   ING HERNIA REPAIR  . RIGHT SHOULDER 2013    . ROBOT ASSISTED LAPAROSCOPIC RADICAL PROSTATECTOMY N/A 02/13/2013   Procedure: ROBOTIC ASSISTED LAPAROSCOPIC RADICAL PROSTATECTOMY,, LYSIS OF PELVIC / INTRA-ABDOMINAL ADHESIONS;  Surgeon: Bernestine Amass, MD;  Location: [facility];  Service: Urology;  Laterality: N/A;   Social History: Social History   Socioeconomic History  . Marital status: Married    Spouse name: [name]  . Number of children: 2  . Years of education: Not on file  . Highest education level: Not on file   Occupational History  . Occupation: Retired  Scientific laboratory technician  . Financial resource strain: Not on file  . Food insecurity:    Worry: Not on file    Inability: Not on file  . Transportation needs:    Medical: Not on file    Non-medical: Not on file  Tobacco Use  . Smoking status: Never Smoker  . Smokeless tobacco: Never Used  Substance and Sexual Activity  . Alcohol use: Yes    Comment: SOCIAL  . Drug use: No  . Sexual activity: Yes  Lifestyle  . Physical activity:    Days per week: Not on file    Minutes per session: Not on file  . Stress: Not on file  Relationships  . Social connections:    Talks on phone: Not on file    Gets together: Not on file    Attends religious service: Not on file    Active member of club or organization: Not on file    Attends meetings of clubs or organizations: Not on file    Relationship status: Not on file  Other Topics Concern  . Not on file  Social History Narrative  . Not on file   Family History: Family History  Problem Relation Age of Onset  . Heart attack Mother   . Hyperlipidemia Mother   . Hypertension Mother   . Stroke Mother   .  Heart attack Father   . Hypertension Father   . Hyperlipidemia Father   . Diabetes Father   . Colon cancer Neg Hx   . Esophageal cancer Neg Hx   . Rectal cancer Neg Hx   . Stomach cancer Neg Hx    Allergies: No Known Allergies Medications: See med rec.  Review of Systems: No fevers, chills, night sweats, weight loss, chest pain, or shortness of breath.   Objective:    General: Well Developed, well nourished, and in no acute distress.  Neuro: Alert and oriented x3, extra-ocular muscles intact, sensation grossly intact.  HEENT: Normocephalic, atraumatic, pupils equal round reactive to light, neck supple, no masses, no lymphadenopathy, thyroid nonpalpable.  Skin: Warm and dry, no rashes. Cardiac: Regular rate and rhythm, no murmurs rubs or gallops, no lower extremity edema.  Respiratory:  Clear to auscultation bilaterally. Not using accessory muscles, speaking in full sentences.  Procedure: Real-time Ultrasound Guided Injection of right first [facility] Device: GE Logiq E  Verbal informed consent obtained.  Time-out conducted.  Noted no overlying erythema, induration, or other signs of local infection.  Skin prepped in a sterile fashion.  Local anesthesia: Topical Ethyl chloride.  With sterile technique and under real time ultrasound guidance: 1/2 cc kenalog 40, 1/2 cc lidocaine injected easily Completed without difficulty  Pain immediately resolved suggesting accurate placement of the medication.  Advised to call if fevers/chills, erythema, induration, drainage, or persistent bleeding.  Images permanently stored and available for review in the ultrasound unit.  Impression: Technically successful ultrasound guided injection.  Impression and Recommendations:    Osteoarthritis of first carpometacarpal joint of right hand Repeat injection today, previous injection was about 3 months ago.  Essential hypertension, benign Beautifully controlled, no change in plan.  Generalized anxiety disorder Drop back down to 25 of sertraline, as well as low-dose alprazolam as needed, doing well. We can certainly try Viibryd or Trintellix if he has adverse effects with sertraline.  Hyperlipidemia, mixed Looked better, I am going to recheck his LFTs.  ___________________________________________ Gwen Her. Dianah Field, M.D., ABFM., CAQSM. Primary Care and Sports Medicine [facility]  Adjunct Professor of [employer] Medicine

## 2017-11-22 NOTE — Assessment & Plan Note (Signed)
Looked better, I am going to recheck his LFTs.

## 2017-11-24 ENCOUNTER — Encounter: Payer: Self-pay | Admitting: Sports Medicine

## 2017-11-26 ENCOUNTER — Encounter: Payer: Self-pay | Admitting: Sports Medicine

## 2017-11-30 DIAGNOSIS — D1801 Hemangioma of skin and subcutaneous tissue: Secondary | ICD-10-CM | POA: Diagnosis not present

## 2017-11-30 DIAGNOSIS — L821 Other seborrheic keratosis: Secondary | ICD-10-CM | POA: Diagnosis not present

## 2017-11-30 DIAGNOSIS — L570 Actinic keratosis: Secondary | ICD-10-CM | POA: Diagnosis not present

## 2017-11-30 DIAGNOSIS — L814 Other melanin hyperpigmentation: Secondary | ICD-10-CM | POA: Diagnosis not present

## 2017-11-30 DIAGNOSIS — D692 Other nonthrombocytopenic purpura: Secondary | ICD-10-CM | POA: Diagnosis not present

## 2017-11-30 DIAGNOSIS — D225 Melanocytic nevi of trunk: Secondary | ICD-10-CM | POA: Diagnosis not present

## 2017-12-21 ENCOUNTER — Encounter: Payer: Self-pay | Admitting: Sports Medicine

## 2017-12-21 MED ORDER — IRBESARTAN 150 MG PO TABS: 150.0000 mg | ORAL_TABLET | Freq: Every day | ORAL | 3 refills | Status: DC

## 2018-01-01 ENCOUNTER — Encounter: Payer: Self-pay | Admitting: Sports Medicine

## 2018-01-01 MED ORDER — IRBESARTAN 300 MG PO TABS
300.0000 mg | ORAL_TABLET | Freq: Every day | ORAL | 0 refills | Status: DC
Start: 2018-01-01 — End: 2018-01-12

## 2018-01-03 ENCOUNTER — Encounter: Payer: Self-pay | Admitting: Sports Medicine

## 2018-01-03 DIAGNOSIS — F411 Generalized anxiety disorder: Secondary | ICD-10-CM

## 2018-01-04 MED ORDER — SERTRALINE HCL 25 MG PO TABS: 25.0000 mg | ORAL_TABLET | Freq: Every day | ORAL | 3 refills | Status: DC

## 2018-01-11 ENCOUNTER — Other Ambulatory Visit: Payer: Self-pay | Admitting: Family Medicine

## 2018-01-11 ENCOUNTER — Encounter: Payer: Self-pay | Admitting: Sports Medicine

## 2018-01-12 ENCOUNTER — Other Ambulatory Visit: Payer: Self-pay | Admitting: Family Medicine

## 2018-01-12 NOTE — Telephone Encounter (Signed)
Routing to pcp for [employer], Lahoma Crocker, CMA

## 2018-01-14 ENCOUNTER — Encounter: Payer: Self-pay | Admitting: Sports Medicine

## 2018-01-14 DIAGNOSIS — F411 Generalized anxiety disorder: Secondary | ICD-10-CM

## 2018-01-15 MED ORDER — ALPRAZOLAM 0.25 MG PO TABS: 0.2500 mg | ORAL_TABLET | Freq: Three times a day (TID) | ORAL | 0 refills | Status: DC | PRN

## 2018-01-31 ENCOUNTER — Encounter: Payer: Self-pay | Admitting: Sports Medicine

## 2018-01-31 DIAGNOSIS — I10 Essential (primary) hypertension: Secondary | ICD-10-CM

## 2018-01-31 MED ORDER — IRBESARTAN 300 MG PO TABS: 300.0000 mg | ORAL_TABLET | Freq: Every day | ORAL | 3 refills | Status: DC

## 2018-02-13 DIAGNOSIS — M961 Postlaminectomy syndrome, not elsewhere classified: Secondary | ICD-10-CM | POA: Diagnosis not present

## 2018-02-13 DIAGNOSIS — M5416 Radiculopathy, lumbar region: Secondary | ICD-10-CM | POA: Diagnosis not present

## 2018-02-13 DIAGNOSIS — M5136 Other intervertebral disc degeneration, lumbar region: Secondary | ICD-10-CM | POA: Diagnosis not present

## 2018-03-06 ENCOUNTER — Ambulatory Visit (INDEPENDENT_AMBULATORY_CARE_PROVIDER_SITE_OTHER): Payer: PPO | Admitting: Sports Medicine

## 2018-03-06 ENCOUNTER — Encounter: Payer: Self-pay | Admitting: Sports Medicine

## 2018-03-06 ENCOUNTER — Other Ambulatory Visit: Payer: Self-pay | Admitting: Sports Medicine

## 2018-03-06 ENCOUNTER — Ambulatory Visit (INDEPENDENT_AMBULATORY_CARE_PROVIDER_SITE_OTHER): Payer: PPO

## 2018-03-06 DIAGNOSIS — M5030 Other cervical disc degeneration, unspecified cervical region: Secondary | ICD-10-CM

## 2018-03-06 DIAGNOSIS — E782 Mixed hyperlipidemia: Secondary | ICD-10-CM

## 2018-03-06 DIAGNOSIS — M4802 Spinal stenosis, cervical region: Secondary | ICD-10-CM | POA: Diagnosis not present

## 2018-03-06 DIAGNOSIS — C61 Malignant neoplasm of prostate: Secondary | ICD-10-CM

## 2018-03-06 DIAGNOSIS — M542 Cervicalgia: Secondary | ICD-10-CM | POA: Diagnosis not present

## 2018-03-06 DIAGNOSIS — I10 Essential (primary) hypertension: Secondary | ICD-10-CM

## 2018-03-06 MED ORDER — PREDNISONE 50 MG PO TABS: ORAL_TABLET | ORAL | 0 refills | Status: DC

## 2018-03-06 NOTE — Progress Notes (Addendum)
Subjective:    CC: Neck pain  HPI: [name] is a pleasant 68 year old male, 4 months ago he was helping a friend cut down a tree.  The tree fell at an awkward angle, he ran away to avoid getting crushed by the tree and ran face first into another another tree.  In addition to facial lacerations and contusions he had severe pain in the back right of his neck.  It was worsened with rightward rotation and extension of the neck.  Nothing was overtly radicular down to the hand or fingertips.  No progressive weakness.  He never sought formal medical attention but did do some physician directed home rehab exercises, unfortunately 4 months later he continues to have severe pain, worse with right rotation and extension, this maneuver produces headaches.  Also produces pain into the trapezius on the right.  I reviewed the past medical history, family history, social history, surgical history, and allergies today and no changes were needed.  Please see the problem list section below in epic for further details.  Past Medical History: Past Medical History:  Diagnosis Date  . ADD (attention deficit disorder)   . Anxiety   . Arthritis 2016   bilateral hands  . Cancer ([place])    PROSTATE  . Diverticulitis   . Hyperlipidemia   . Hypertension   . Pain    HX OF 3 BACK SURGERIES - STILL HAS SEVERE BACK PAIN WITH PROLONGED SITTING OR STANDING  . PONV (postoperative nausea and vomiting)    NAUSEA AFTER SHOULDER SURGERY  . Reflux    MILD - NO DAILY MEDS - ANTIACID IF NEEDED   Past Surgical History: Past Surgical History:  Procedure Laterality Date  . [place], 2007  . COLON SURGERY  2008   COLON RESECTION FOR DIVERTICULITIS  . HERNIA REPAIR  1997 OR 1998   ING HERNIA REPAIR  . RIGHT SHOULDER 2013    . ROBOT ASSISTED LAPAROSCOPIC RADICAL PROSTATECTOMY N/A 02/13/2013   Procedure: ROBOTIC ASSISTED LAPAROSCOPIC RADICAL PROSTATECTOMY,, LYSIS OF PELVIC / INTRA-ABDOMINAL ADHESIONS;   Surgeon: Bernestine Amass, MD;  Location: [facility];  Service: Urology;  Laterality: N/A;   Social History: Social History   Socioeconomic History  . Marital status: Married    Spouse name: [name]  . Number of children: 2  . Years of education: Not on file  . Highest education level: Not on file  Occupational History  . Occupation: Retired  Scientific laboratory technician  . Financial resource strain: Not on file  . Food insecurity:    Worry: Not on file    Inability: Not on file  . Transportation needs:    Medical: Not on file    Non-medical: Not on file  Tobacco Use  . Smoking status: Never Smoker  . Smokeless tobacco: Never Used  Substance and Sexual Activity  . Alcohol use: Yes    Comment: SOCIAL  . Drug use: No  . Sexual activity: Yes  Lifestyle  . Physical activity:    Days per week: Not on file    Minutes per session: Not on file  . Stress: Not on file  Relationships  . Social connections:    Talks on phone: Not on file    Gets together: Not on file    Attends religious service: Not on file    Active member of club or organization: Not on file    Attends meetings of clubs or organizations: Not on file  Relationship status: Not on file  Other Topics Concern  . Not on file  Social History Narrative  . Not on file   Family History: Family History  Problem Relation Age of Onset  . Heart attack Mother   . Hyperlipidemia Mother   . Hypertension Mother   . Stroke Mother   . Heart attack Father   . Hypertension Father   . Hyperlipidemia Father   . Diabetes Father   . Colon cancer Neg Hx   . Esophageal cancer Neg Hx   . Rectal cancer Neg Hx   . Stomach cancer Neg Hx    Allergies: No Known Allergies Medications: See med rec.  Review of Systems: No fevers, chills, night sweats, weight loss, chest pain, or shortness of breath.   Objective:    General: Well Developed, well nourished, and in no acute distress.  Neuro: Alert and oriented x3, extra-ocular muscles intact,  sensation grossly intact.  HEENT: Normocephalic, atraumatic, pupils equal round reactive to light, neck supple, no masses, no lymphadenopathy, thyroid nonpalpable.  Skin: Warm and dry, no rashes. Cardiac: Regular rate and rhythm, no murmurs rubs or gallops, no lower extremity edema.  Respiratory: Clear to auscultation bilaterally. Not using accessory muscles, speaking in full sentences. Neck:  Negative spurling's Full neck range of motion Grip strength and sensation normal in bilateral hands Strength good C4 to T1 distribution No sensory change to C4 to T1 Reflexes normal  Impression and Recommendations:    DDD (degenerative disc disease), cervical Pain for over 4 months now. He did have an injury when this started. X-rays, MRI, prednisone, rehab exercises given. Return in a month. ___________________________________________ Gwen Her. Dianah Field, M.D., ABFM., CAQSM. Primary Care and Sports Medicine [facility]  Adjunct Professor of [employer] of Medicine

## 2018-03-06 NOTE — Assessment & Plan Note (Signed)
Pain for over 4 months now. He did have an injury when this started. X-rays, MRI, prednisone, rehab exercises given. Return in a month.

## 2018-03-09 ENCOUNTER — Encounter: Payer: Self-pay | Admitting: Sports Medicine

## 2018-03-09 DIAGNOSIS — I10 Essential (primary) hypertension: Secondary | ICD-10-CM | POA: Diagnosis not present

## 2018-03-09 DIAGNOSIS — E782 Mixed hyperlipidemia: Secondary | ICD-10-CM | POA: Diagnosis not present

## 2018-03-09 DIAGNOSIS — C61 Malignant neoplasm of prostate: Secondary | ICD-10-CM | POA: Diagnosis not present

## 2018-03-09 NOTE — Telephone Encounter (Signed)
Abigail Butts, please take a look at this, looks like our lab techs are telling patients that we are coding labs wrong, and they are telling patients that we SHOULD be linking labs to "annual physical exam" and other Z codes.   Would you please have a talk with the [facility] techs and inform them that when we link the codes to annual physical, they aren't covered?  Thank you!

## 2018-03-12 LAB — COMPREHENSIVE METABOLIC PANEL
AG Ratio: 2.1 (calc) (ref 1.0–2.5)
ALT: 30 U/L (ref 9–46)
AST: 22 U/L (ref 10–35)
Albumin: 4.4 g/dL (ref 3.6–5.1)
Alkaline phosphatase (APISO): 32 U/L — ABNORMAL LOW (ref 35–144)
CO2: 29 mmol/L (ref 20–32)
Calcium: 9.2 mg/dL (ref 8.6–10.3)
Chloride: 104 mmol/L (ref 98–110)
Creat: 1.16 mg/dL (ref 0.70–1.25)
Globulin: 2.1 g/dL (calc) (ref 1.9–3.7)
Glucose, Bld: 85 mg/dL (ref 65–99)
Potassium: 3.8 mmol/L (ref 3.5–5.3)
Sodium: 142 mmol/L (ref 135–146)
Total Bilirubin: 0.4 mg/dL (ref 0.2–1.2)
Total Protein: 6.5 g/dL (ref 6.1–8.1)

## 2018-03-12 LAB — CBC
HCT: 42.7 % (ref 38.5–50.0)
Hemoglobin: 14.3 g/dL (ref 13.2–17.1)
MCH: 30.8 pg (ref 27.0–33.0)
MCHC: 33.5 g/dL (ref 32.0–36.0)
MCV: 92 fL (ref 80.0–100.0)
MPV: 10.3 fL (ref 7.5–12.5)
Platelets: 284 10*3/uL (ref 140–400)
RBC: 4.64 10*6/uL (ref 4.20–5.80)
RDW: 12.4 % (ref 11.0–15.0)
WBC: 8.6 Thousand/uL (ref 3.8–10.8)

## 2018-03-12 LAB — LIPID PANEL W/REFLEX DIRECT LDL
Cholesterol: 181 mg/dL (ref <200)
HDL: 59 mg/dL (ref >=40)
LDL Cholesterol (Calc): 104 mg/dL (calc) — ABNORMAL HIGH
Non-HDL Cholesterol (Calc): 122 mg/dL (calc) (ref <130)
Total CHOL/HDL Ratio: 3.1 (calc) (ref <5.0)
Triglycerides: 85 mg/dL (ref <150)

## 2018-03-12 LAB — PSA, TOTAL AND FREE
PSA, % Free: UNDETERMINED % (calc) (ref >25)
PSA, Free: <0.1 ng/mL
PSA, Total: <0.1 ng/mL (ref <=4.0)

## 2018-03-12 LAB — COMPREHENSIVE METABOLIC PANEL WITH GFR: BUN: 19 mg/dL (ref 7–25)

## 2018-03-12 LAB — TSH: TSH: 2.53 mIU/L (ref 0.40–4.50)

## 2018-03-13 ENCOUNTER — Encounter: Payer: Self-pay | Admitting: Sports Medicine

## 2018-03-13 ENCOUNTER — Ambulatory Visit (INDEPENDENT_AMBULATORY_CARE_PROVIDER_SITE_OTHER): Payer: PPO | Admitting: Sports Medicine

## 2018-03-13 VITALS — BP 147/81 | HR 85 | Ht 67.0 in | Wt 167.0 lb

## 2018-03-13 DIAGNOSIS — M5030 Other cervical disc degeneration, unspecified cervical region: Secondary | ICD-10-CM | POA: Diagnosis not present

## 2018-03-13 DIAGNOSIS — Z0000 Encounter for general adult medical examination without abnormal findings: Secondary | ICD-10-CM | POA: Diagnosis not present

## 2018-03-13 DIAGNOSIS — M180 Bilateral primary osteoarthritis of first carpometacarpal joints: Secondary | ICD-10-CM

## 2018-03-13 DIAGNOSIS — E782 Mixed hyperlipidemia: Secondary | ICD-10-CM

## 2018-03-13 NOTE — Assessment & Plan Note (Signed)
Bilateral injection today, previous injection was only on the right in November 2019.

## 2018-03-13 NOTE — Assessment & Plan Note (Signed)
Doing extremely well on fenofibrate, he did not tolerate multiple statins, no myalgias, LFTs are normal.

## 2018-03-13 NOTE — Progress Notes (Signed)
Subjective:   [name] is a 68 y.o. male who presents for Medicare Annual/Subsequent preventive examination.  Review of Systems:  Comprehensive review of systems is negative.       Objective:    Vitals: BP (!) 147/81   Pulse 85   Ht 5\' 7"  (1.702 m)   Wt 167 lb (75.8 kg)   BMI 26.16 kg/m   Body mass index is 26.16 kg/m.  Advanced Directives 02/13/2013 05/11/2011 05/10/2011  Does Patient Have a Medical Advance Directive? Patient has advance directive, copy not in chart - Patient has advance directive, copy not in chart  Type of Advance Directive Living will - -  Does patient want to make changes to medical advance directive? No - -  Copy of Healthcare Power of Attorney in Chart? Copy requested from family - -  Pre-existing out of facility DNR order (yellow form or pink MOST form) No No -    Tobacco Social History   Tobacco Use  Smoking Status Never Smoker  Smokeless Tobacco Never Used     Counseling given: Not Answered   Clinical Intake:  [name] is doing extremely well, he does have pain in both CMC's.    Past Medical History:  Diagnosis Date  . ADD (attention deficit disorder)   . Anxiety   . Arthritis 2016   bilateral hands  . Cancer ([place])    PROSTATE  . Diverticulitis   . Hyperlipidemia   . Hypertension   . Pain    HX OF 3 BACK SURGERIES - STILL HAS SEVERE BACK PAIN WITH PROLONGED SITTING OR STANDING  . PONV (postoperative nausea and vomiting)    NAUSEA AFTER SHOULDER SURGERY  . Reflux    MILD - NO DAILY MEDS - ANTIACID IF NEEDED   Past Surgical History:  Procedure Laterality Date  . [place], 2007  . COLON SURGERY  2008   COLON RESECTION FOR DIVERTICULITIS  . HERNIA REPAIR  1997 OR 1998   ING HERNIA REPAIR  . RIGHT SHOULDER 2013    . ROBOT ASSISTED LAPAROSCOPIC RADICAL PROSTATECTOMY N/A 02/13/2013   Procedure: ROBOTIC ASSISTED LAPAROSCOPIC RADICAL PROSTATECTOMY,, LYSIS OF PELVIC / INTRA-ABDOMINAL ADHESIONS;  Surgeon:  Bernestine Amass, MD;  Location: [facility];  Service: Urology;  Laterality: N/A;   Family History  Problem Relation Age of Onset  . Heart attack Mother   . Hyperlipidemia Mother   . Hypertension Mother   . Stroke Mother   . Heart attack Father   . Hypertension Father   . Hyperlipidemia Father   . Diabetes Father   . Colon cancer Neg Hx   . Esophageal cancer Neg Hx   . Rectal cancer Neg Hx   . Stomach cancer Neg Hx    Social History   Socioeconomic History  . Marital status: Married    Spouse name: [name]  . Number of children: 2  . Years of education: Not on file  . Highest education level: Not on file  Occupational History  . Occupation: Retired  Scientific laboratory technician  . Financial resource strain: Not on file  . Food insecurity:    Worry: Not on file    Inability: Not on file  . Transportation needs:    Medical: Not on file    Non-medical: Not on file  Tobacco Use  . Smoking status: Never Smoker  . Smokeless tobacco: Never Used  Substance and Sexual Activity  . Alcohol use: Yes  Comment: SOCIAL  . Drug use: No  . Sexual activity: Yes  Lifestyle  . Physical activity:    Days per week: Not on file    Minutes per session: Not on file  . Stress: Not on file  Relationships  . Social connections:    Talks on phone: Not on file    Gets together: Not on file    Attends religious service: Not on file    Active member of club or organization: Not on file    Attends meetings of clubs or organizations: Not on file    Relationship status: Not on file  Other Topics Concern  . Not on file  Social History Narrative  . Not on file    Outpatient Encounter Medications as of 03/13/2018  Medication Sig  . ALPRAZolam (XANAX) 0.25 MG tablet Take 1 tablet (0.25 mg total) by mouth 3 (three) times daily as needed for anxiety.  Marland Kitchen amitriptyline (ELAVIL) 25 MG tablet Take 1 tablet (25 mg total) by mouth at bedtime.  Marland Kitchen aspirin 81 MG tablet Take 1 tablet (81 mg total) by mouth daily.  .  cholecalciferol (VITAMIN D) 1000 units tablet Take 1,000 Units by mouth daily.  . clotrimazole-betamethasone (LOTRISONE) cream Apply 1 application topically 2 (two) times daily.  . Coenzyme Q10 (EQL COQ10) 300 MG CAPS Take 300 mg by mouth daily.  . fenofibrate 160 MG tablet TAKE 1 TABLET BY MOUTH EVERY DAY  . irbesartan (AVAPRO) 300 MG tablet Take 1 tablet (300 mg total) by mouth daily.  . metroNIDAZOLE (METROGEL) 0.75 % gel Apply 1 application topically 2 (two) times daily.  . Multiple Vitamin (MULTI VITAMIN DAILY PO) Take by mouth.  . Omega-3 Fatty Acids (FISH OIL) 1200 MG CAPS Take 2 capsules by mouth daily.  . Probiotic Product (PROBIOTIC PO) Take by mouth.  . Psyllium (METAMUCIL FREE & NATURAL) 43 % POWD Take 1 scoop by mouth daily.  . sertraline (ZOLOFT) 25 MG tablet Take 1 tablet (25 mg total) by mouth daily.  . TURMERIC PO Take 800 mg by mouth daily.  Marland Kitchen UNABLE TO FIND Med Name: CBD Oil  . [DISCONTINUED] predniSONE (DELTASONE) 50 MG tablet One tab PO daily for 5 days.   Facility-Administered Encounter Medications as of 03/13/2018  Medication  . 0.9 %  sodium chloride infusion    Activities of Daily Living No flowsheet data found.  Patient Care Team: Silverio Decamp, MD as PCP - General (Sports Medicine)   Assessment:   This is a routine wellness examination for [name].  Exercise Activities and Dietary recommendations   Patient is healthy, no specific change in recommendations from how he is living his life now.  Fall Risk Fall Risk  03/07/2017 09/05/2016 01/29/2015  Falls in the past year? No No No   Is the patient's home free of loose throw rugs in walkways, pet beds, electrical cords, etc?   yes      Grab bars in the bathroom? no      Handrails on the stairs?   yes      Adequate lighting?   yes  Timed Get Up and Go Performed: Normal  Depression Screen PHQ 2/9 Scores 03/13/2018 11/22/2017 10/25/2017 09/22/2017  PHQ - 2 Score 1 1 1 3   PHQ- 9 Score 4 3 5 9      Cognitive Function  Adequate.    Immunization History  Administered Date(s) Administered  . Influenza, High Dose Seasonal PF 09/05/2016  . Influenza,inj,Quad PF,6+ Mos 08/25/2017  .  Influenza-Unspecified 10/11/2014, 09/20/2015, 09/24/2015  . Pneumococcal Conjugate-13 02/12/2016  . Pneumococcal-Unspecified 10/11/2014  . Tdap 07/03/2006, 03/07/2017  . Zoster 10/07/2010    Qualifies for Shingles Vaccine? Not needed  Screening Tests Health Maintenance  Topic Date Due  . PNA vac Low Risk Adult (2 of 2 - PPSV23) 10/11/2019  . COLONOSCOPY  06/10/2026  . TETANUS/TDAP  03/08/2027  . INFLUENZA VACCINE  Completed  . Hepatitis C Screening  Completed   Cancer Screenings: Lung: Low Dose CT Chest recommended if Age 3-80 years, 30 pack-year currently smoking OR have quit w/in 15years. Patient does not qualify. Colorectal: up to date  Additional Screenings:  Hepatitis C Screening: Done   Procedure: Real-time Ultrasound Guided injection of the left first [facility] Device: GE Logiq E  Verbal informed consent obtained.  Time-out conducted.  Noted no overlying erythema, induration, or other signs of local infection.  Skin prepped in a sterile fashion.  Local anesthesia: Topical Ethyl chloride.  With sterile technique and under real time ultrasound guidance:  1/2 cc Kenalog 40, 1/2 cc lidocaine injected easily Completed without difficulty  Pain immediately resolved suggesting accurate placement of the medication.  Advised to call if fevers/chills, erythema, induration, drainage, or persistent bleeding.  Images permanently stored and available for review in the ultrasound unit.  Impression: Technically successful ultrasound guided injection.  Procedure: Real-time Ultrasound Guided injection of the right first [facility] Device: GE Logiq E  Verbal informed consent obtained.  Time-out conducted.  Noted no overlying erythema, induration, or other signs of local infection.  Skin prepped in a  sterile fashion.  Local anesthesia: Topical Ethyl chloride.  With sterile technique and under real time ultrasound guidance:  1/2 cc Kenalog 40, 1/2 cc lidocaine injected easily Completed without difficulty  Pain immediately resolved suggesting accurate placement of the medication.  Advised to call if fevers/chills, erythema, induration, drainage, or persistent bleeding.  Images permanently stored and available for review in the ultrasound unit.  Impression: Technically successful ultrasound guided injection.     Plan:   Healthy male  I have personally reviewed and noted the following in the patient's chart:   . Medical and social history . Use of alcohol, tobacco or illicit drugs  . Current medications and supplements . Functional ability and status . Nutritional status . Physical activity . Advanced directives . List of other physicians . Hospitalizations, surgeries, and ER visits in previous 12 months . Vitals . Screenings to include cognitive, depression, and falls . Referrals and appointments  In addition, I have reviewed and discussed with patient certain preventive protocols, quality metrics, and best practice recommendations. A written personalized care plan for preventive services as well as general preventive health recommendations were provided to patient.  Bilateral primary osteoarthritis of first carpometacarpal joints Bilateral injection today, previous injection was only on the right in November 2019.  Annual physical exam Healthy male, unremarkable physical. Up-to-date.  DDD (degenerative disc disease), cervical Completely resolved with prednisone and rehab exercises. He is going to cancel his MRI.  Hyperlipidemia, mixed Doing extremely well on fenofibrate, he did not tolerate multiple statins, no myalgias, LFTs are normal.    Aundria Mems, MD  03/13/2018

## 2018-03-13 NOTE — Assessment & Plan Note (Signed)
Healthy male, unremarkable physical. Up-to-date.

## 2018-03-13 NOTE — Assessment & Plan Note (Signed)
Completely resolved with prednisone and rehab exercises. He is going to cancel his MRI.

## 2018-04-01 ENCOUNTER — Other Ambulatory Visit: Payer: PPO

## 2018-04-03 ENCOUNTER — Ambulatory Visit: Payer: PPO | Admitting: Sports Medicine

## 2018-04-11 ENCOUNTER — Encounter (INDEPENDENT_AMBULATORY_CARE_PROVIDER_SITE_OTHER): Payer: PPO | Admitting: Sports Medicine

## 2018-04-11 DIAGNOSIS — F411 Generalized anxiety disorder: Secondary | ICD-10-CM | POA: Diagnosis not present

## 2018-04-11 MED ORDER — ALPRAZOLAM 0.25 MG PO TABS: 0.2500 mg | ORAL_TABLET | Freq: Three times a day (TID) | ORAL | 0 refills | Status: DC | PRN

## 2018-04-18 ENCOUNTER — Encounter: Payer: Self-pay | Admitting: Sports Medicine

## 2018-06-19 ENCOUNTER — Ambulatory Visit (INDEPENDENT_AMBULATORY_CARE_PROVIDER_SITE_OTHER): Payer: PPO | Admitting: Sports Medicine

## 2018-06-19 DIAGNOSIS — M180 Bilateral primary osteoarthritis of first carpometacarpal joints: Secondary | ICD-10-CM | POA: Diagnosis not present

## 2018-06-19 DIAGNOSIS — C44329 Squamous cell carcinoma of skin of other parts of face: Secondary | ICD-10-CM | POA: Diagnosis not present

## 2018-06-19 DIAGNOSIS — D099 Carcinoma in situ, unspecified: Secondary | ICD-10-CM

## 2018-06-19 NOTE — Addendum Note (Signed)
Addended by: Beatris Ship L on: 06/19/2018 11:26 AM   Modules accepted: Orders

## 2018-06-19 NOTE — Assessment & Plan Note (Signed)
Bilateral first CMC injections today. Previous injections were just over 3 months ago.

## 2018-06-19 NOTE — Progress Notes (Signed)
Subjective:    CC: Skin lesion, hand pain  HPI: This is a pleasant 68 year old male, he has noticed a skin lesion, slowly growing over the past couple of weeks on his forehead, minimally tender.  He does have a history of a squamous cell carcinoma on the leg that we were able to fully surgically excised.  Symptoms are moderate, persistent.  [name] also has bilateral trapeziometacarpal joint osteoarthritis, his previous injections were approximately 3 months ago, he is having recurrence of pain, moderate, persistent, localized without radiation, desires repeat interventional treatment today.  I reviewed the past medical history, family history, social history, surgical history, and allergies today and no changes were needed.  Please see the problem list section below in epic for further details.  Past Medical History: Past Medical History:  Diagnosis Date  . ADD (attention deficit disorder)   . Anxiety   . Arthritis 2016   bilateral hands  . Cancer ([place])    PROSTATE  . Diverticulitis   . Hyperlipidemia   . Hypertension   . Pain    HX OF 3 BACK SURGERIES - STILL HAS SEVERE BACK PAIN WITH PROLONGED SITTING OR STANDING  . PONV (postoperative nausea and vomiting)    NAUSEA AFTER SHOULDER SURGERY  . Reflux    MILD - NO DAILY MEDS - ANTIACID IF NEEDED   Past Surgical History: Past Surgical History:  Procedure Laterality Date  . [place], 2007  . COLON SURGERY  2008   COLON RESECTION FOR DIVERTICULITIS  . HERNIA REPAIR  1997 OR 1998   ING HERNIA REPAIR  . RIGHT SHOULDER 2013    . ROBOT ASSISTED LAPAROSCOPIC RADICAL PROSTATECTOMY N/A 02/13/2013   Procedure: ROBOTIC ASSISTED LAPAROSCOPIC RADICAL PROSTATECTOMY,, LYSIS OF PELVIC / INTRA-ABDOMINAL ADHESIONS;  Surgeon: Bernestine Amass, MD;  Location: [facility];  Service: Urology;  Laterality: N/A;   Social History: Social History   Socioeconomic History  . Marital status: Married    Spouse name: [name]  .  Number of children: 2  . Years of education: Not on file  . Highest education level: Not on file  Occupational History  . Occupation: Retired  Scientific laboratory technician  . Financial resource strain: Not on file  . Food insecurity:    Worry: Not on file    Inability: Not on file  . Transportation needs:    Medical: Not on file    Non-medical: Not on file  Tobacco Use  . Smoking status: Never Smoker  . Smokeless tobacco: Never Used  Substance and Sexual Activity  . Alcohol use: Yes    Comment: SOCIAL  . Drug use: No  . Sexual activity: Yes  Lifestyle  . Physical activity:    Days per week: Not on file    Minutes per session: Not on file  . Stress: Not on file  Relationships  . Social connections:    Talks on phone: Not on file    Gets together: Not on file    Attends religious service: Not on file    Active member of club or organization: Not on file    Attends meetings of clubs or organizations: Not on file    Relationship status: Not on file  Other Topics Concern  . Not on file  Social History Narrative  . Not on file   Family History: Family History  Problem Relation Age of Onset  . Heart attack Mother   . Hyperlipidemia Mother   .  Hypertension Mother   . Stroke Mother   . Heart attack Father   . Hypertension Father   . Hyperlipidemia Father   . Diabetes Father   . Colon cancer Neg Hx   . Esophageal cancer Neg Hx   . Rectal cancer Neg Hx   . Stomach cancer Neg Hx    Allergies: No Known Allergies Medications: See med rec.  Review of Systems: No fevers, chills, night sweats, weight loss, chest pain, or shortness of breath.   Objective:    General: Well Developed, well nourished, and in no acute distress.  Neuro: Alert and oriented x3, extra-ocular muscles intact, sensation grossly intact.  HEENT: Normocephalic, atraumatic, pupils equal round reactive to light, neck supple, no masses, no lymphadenopathy, thyroid nonpalpable.  Skin: Warm and dry, no rashes.  There is  a 0.9 cm papule, dome-shaped, with overlying telangiectasias over the left forehead, this is concerning for a basal cell carcinoma. Cardiac: Regular rate and rhythm, no murmurs rubs or gallops, no lower extremity edema.  Respiratory: Clear to auscultation bilaterally. Not using accessory muscles, speaking in full sentences.  Procedure:  Excision of left forehead 0.9 cm dome-shaped skin lesion Risks, benefits, and alternatives explained and consent obtained. Time out conducted. Surface prepped with alcohol. 1cc lidocaine with epinephine infiltrated in a field block. Adequate anesthesia ensured. Area prepped and draped in a sterile fashion. Excision performed with: I used a derma blade to shave into the deep dermis, I then used a Hyfrecator to achieve hemostasis. Pt stable.  Procedure: Real-time Ultrasound Guided injection of the left first [facility] Device: GE Logiq E  Verbal informed consent obtained.  Time-out conducted.  Noted no overlying erythema, induration, or other signs of local infection.  Skin prepped in a sterile fashion.  Local anesthesia: Topical Ethyl chloride.  With sterile technique and under real time ultrasound guidance:  1/2 cc Kenalog 40, 1/2 cc lidocaine injected easily Completed without difficulty  Pain immediately resolved suggesting accurate placement of the medication.  Advised to call if fevers/chills, erythema, induration, drainage, or persistent bleeding.  Images permanently stored and available for review in the ultrasound unit.  Impression: Technically successful ultrasound guided injection.  Procedure: Real-time Ultrasound Guided injection of the right first [facility] Device: GE Logiq E  Verbal informed consent obtained.  Time-out conducted.  Noted no overlying erythema, induration, or other signs of local infection.  Skin prepped in a sterile fashion.  Local anesthesia: Topical Ethyl chloride.  With sterile technique and under real time ultrasound guidance:  1/2  cc Kenalog 40, 1/2 cc lidocaine injected easily Completed without difficulty  Pain immediately resolved suggesting accurate placement of the medication.  Advised to call if fevers/chills, erythema, induration, drainage, or persistent bleeding.  Images permanently stored and available for review in the ultrasound unit.  Impression: Technically successful ultrasound guided injection.  Impression and Recommendations:    Squamous cell carcinoma in situ New skin lesion on the left forehead. Shave biopsy today, he does have a history of a squamous cell carcinoma of the lower leg. Return as needed for this.  Bilateral primary osteoarthritis of first carpometacarpal joints Bilateral first [facility] injections today. Previous injections were just over 3 months ago.   ___________________________________________ Gwen Her. Dianah Field, M.D., ABFM., CAQSM. Primary Care and Sports Medicine [facility]  Adjunct Professor of [place] [employer] [facility] [employer]

## 2018-06-19 NOTE — Assessment & Plan Note (Signed)
New skin lesion on the left forehead. Shave biopsy today, he does have a history of a squamous cell carcinoma of the lower leg. Return as needed for this.

## 2018-07-03 ENCOUNTER — Encounter: Payer: Self-pay | Admitting: Sports Medicine

## 2018-07-05 DIAGNOSIS — Z85828 Personal history of other malignant neoplasm of skin: Secondary | ICD-10-CM | POA: Diagnosis not present

## 2018-07-05 DIAGNOSIS — C44329 Squamous cell carcinoma of skin of other parts of face: Secondary | ICD-10-CM | POA: Diagnosis not present

## 2018-07-05 DIAGNOSIS — L821 Other seborrheic keratosis: Secondary | ICD-10-CM | POA: Diagnosis not present

## 2018-07-11 ENCOUNTER — Encounter: Payer: Self-pay | Admitting: Sports Medicine

## 2018-07-11 MED ORDER — ERYTHROMYCIN 5 MG/GM OP OINT: 1.0000 "application " | TOPICAL_OINTMENT | Freq: Three times a day (TID) | OPHTHALMIC | 0 refills | Status: AC

## 2018-07-19 DIAGNOSIS — C44329 Squamous cell carcinoma of skin of other parts of face: Secondary | ICD-10-CM | POA: Diagnosis not present

## 2018-07-19 DIAGNOSIS — Z85828 Personal history of other malignant neoplasm of skin: Secondary | ICD-10-CM | POA: Diagnosis not present

## 2018-07-28 ENCOUNTER — Encounter: Payer: Self-pay | Admitting: Sports Medicine

## 2018-07-28 DIAGNOSIS — F411 Generalized anxiety disorder: Secondary | ICD-10-CM

## 2018-07-30 MED ORDER — ALPRAZOLAM 0.25 MG PO TABS: 0.2500 mg | ORAL_TABLET | Freq: Three times a day (TID) | ORAL | 0 refills | Status: DC | PRN

## 2018-08-09 ENCOUNTER — Other Ambulatory Visit: Payer: Self-pay

## 2018-09-05 ENCOUNTER — Encounter: Payer: Self-pay | Admitting: Sports Medicine

## 2018-09-20 DIAGNOSIS — M961 Postlaminectomy syndrome, not elsewhere classified: Secondary | ICD-10-CM | POA: Diagnosis not present

## 2018-09-20 DIAGNOSIS — M5416 Radiculopathy, lumbar region: Secondary | ICD-10-CM | POA: Diagnosis not present

## 2018-10-22 DIAGNOSIS — H25013 Cortical age-related cataract, bilateral: Secondary | ICD-10-CM | POA: Diagnosis not present

## 2018-10-22 DIAGNOSIS — H2513 Age-related nuclear cataract, bilateral: Secondary | ICD-10-CM | POA: Diagnosis not present

## 2018-10-22 DIAGNOSIS — H00022 Hordeolum internum right lower eyelid: Secondary | ICD-10-CM | POA: Diagnosis not present

## 2018-11-06 ENCOUNTER — Encounter: Payer: Self-pay | Admitting: Sports Medicine

## 2018-11-27 ENCOUNTER — Encounter: Payer: Self-pay | Admitting: Sports Medicine

## 2018-11-27 DIAGNOSIS — F411 Generalized anxiety disorder: Secondary | ICD-10-CM

## 2018-11-28 MED ORDER — ALPRAZOLAM 0.25 MG PO TABS: 0.2500 mg | ORAL_TABLET | Freq: Three times a day (TID) | ORAL | 0 refills | Status: DC | PRN

## 2018-11-30 ENCOUNTER — Other Ambulatory Visit: Payer: Self-pay

## 2018-12-26 ENCOUNTER — Other Ambulatory Visit: Payer: Self-pay | Admitting: Sports Medicine

## 2018-12-26 MED ORDER — AMITRIPTYLINE HCL 25 MG PO TABS: 25.0000 mg | ORAL_TABLET | Freq: Every day | ORAL | 3 refills | Status: DC

## 2019-01-09 DIAGNOSIS — Z20828 Contact with and (suspected) exposure to other viral communicable diseases: Secondary | ICD-10-CM | POA: Diagnosis not present

## 2019-01-31 ENCOUNTER — Ambulatory Visit: Payer: PPO | Attending: Internal Medicine

## 2019-01-31 DIAGNOSIS — Z23 Encounter for immunization: Secondary | ICD-10-CM

## 2019-01-31 NOTE — Progress Notes (Signed)
   Covid-19 Vaccination Clinic  Name:  [name]    MRN: [id number] DOB: 1950-11-14  01/31/2019  [name] was observed post Covid-19 immunization for 15 minutes without incidence. He was provided with Vaccine Information Sheet and instruction to access the V-Safe system.   [name] was instructed to call 911 with any severe reactions post vaccine: Marland Kitchen Difficulty breathing  . Swelling of your face and throat  . A fast heartbeat  . A bad rash all over your body  . Dizziness and weakness    Immunizations Administered    Name Date Dose VIS Date Route   Pfizer COVID-19 Vaccine 01/31/2019  1:27 PM 0.3 mL 12/21/2018 Intramuscular   Manufacturer: [place]   Lot: [id number]   [place]: [id number]

## 2019-02-08 DIAGNOSIS — D1801 Hemangioma of skin and subcutaneous tissue: Secondary | ICD-10-CM | POA: Diagnosis not present

## 2019-02-08 DIAGNOSIS — D220 Melanocytic nevi of lip: Secondary | ICD-10-CM | POA: Diagnosis not present

## 2019-02-08 DIAGNOSIS — L814 Other melanin hyperpigmentation: Secondary | ICD-10-CM | POA: Diagnosis not present

## 2019-02-08 DIAGNOSIS — Z85828 Personal history of other malignant neoplasm of skin: Secondary | ICD-10-CM | POA: Diagnosis not present

## 2019-02-08 DIAGNOSIS — L570 Actinic keratosis: Secondary | ICD-10-CM | POA: Diagnosis not present

## 2019-02-08 DIAGNOSIS — L821 Other seborrheic keratosis: Secondary | ICD-10-CM | POA: Diagnosis not present

## 2019-02-18 ENCOUNTER — Other Ambulatory Visit: Payer: Self-pay | Admitting: Sports Medicine

## 2019-02-18 DIAGNOSIS — E782 Mixed hyperlipidemia: Secondary | ICD-10-CM

## 2019-02-18 MED ORDER — FENOFIBRATE 160 MG PO TABS: 160.0000 mg | ORAL_TABLET | Freq: Every day | ORAL | 3 refills | Status: DC

## 2019-02-21 ENCOUNTER — Ambulatory Visit: Payer: PPO | Attending: Internal Medicine

## 2019-02-21 DIAGNOSIS — Z23 Encounter for immunization: Secondary | ICD-10-CM | POA: Insufficient documentation

## 2019-02-21 NOTE — Progress Notes (Signed)
   Covid-19 Vaccination Clinic  Name:  BIANCA MCWAIN    MRN: [id number] DOB: June 11, 1950  02/21/2019  Mr. Schmeichel was observed post Covid-19 immunization for 15 minutes without incidence. He was provided with Vaccine Information Sheet and instruction to access the V-Safe system.   Mr. Gayton was instructed to call 911 with any severe reactions post vaccine: Marland Kitchen Difficulty breathing  . Swelling of your face and throat  . A fast heartbeat  . A bad rash all over your body  . Dizziness and weakness    Immunizations Administered    Name Date Dose VIS Date Route   Pfizer COVID-19 Vaccine 02/21/2019  2:17 PM 0.3 mL 12/21/2018 Intramuscular   Manufacturer: [employer]   Lot: [id number]   [place]: [id number]

## 2019-03-08 DIAGNOSIS — C61 Malignant neoplasm of prostate: Secondary | ICD-10-CM

## 2019-03-08 DIAGNOSIS — Z0000 Encounter for general adult medical examination without abnormal findings: Secondary | ICD-10-CM

## 2019-03-08 DIAGNOSIS — E782 Mixed hyperlipidemia: Secondary | ICD-10-CM

## 2019-03-08 DIAGNOSIS — I10 Essential (primary) hypertension: Secondary | ICD-10-CM

## 2019-03-08 NOTE — Telephone Encounter (Signed)
Labs pended, please advise.

## 2019-03-13 DIAGNOSIS — I10 Essential (primary) hypertension: Secondary | ICD-10-CM | POA: Diagnosis not present

## 2019-03-13 DIAGNOSIS — C61 Malignant neoplasm of prostate: Secondary | ICD-10-CM | POA: Diagnosis not present

## 2019-03-13 DIAGNOSIS — E782 Mixed hyperlipidemia: Secondary | ICD-10-CM | POA: Diagnosis not present

## 2019-03-13 DIAGNOSIS — Z0000 Encounter for general adult medical examination without abnormal findings: Secondary | ICD-10-CM | POA: Diagnosis not present

## 2019-03-13 LAB — CBC
HCT: 45.4 % (ref 38.5–50.0)
Hemoglobin: 15.1 g/dL (ref 13.2–17.1)
MCH: 30.7 pg (ref 27.0–33.0)
MCHC: 33.3 g/dL (ref 32.0–36.0)
MCV: 92.3 fL (ref 80.0–100.0)
MPV: 10 fL (ref 7.5–12.5)
Platelets: 296 10*3/uL (ref 140–400)
RBC: 4.92 10*6/uL (ref 4.20–5.80)
RDW: 12.1 % (ref 11.0–15.0)
WBC: 5.9 10*3/uL (ref 3.8–10.8)

## 2019-03-13 LAB — COMPLETE METABOLIC PANEL WITH GFR
AG Ratio: 2.2 (calc) (ref 1.0–2.5)
ALT: 26 U/L (ref 9–46)
AST: 25 U/L (ref 10–35)
Albumin: 4.6 g/dL (ref 3.6–5.1)
Alkaline phosphatase (APISO): 45 U/L (ref 35–144)
BUN: 16 mg/dL (ref 7–25)
CO2: 29 mmol/L (ref 20–32)
Calcium: 9.7 mg/dL (ref 8.6–10.3)
Chloride: 105 mmol/L (ref 98–110)
Creat: 1.11 mg/dL (ref 0.70–1.25)
GFR, Est African American: 78 mL/min/{1.73_m2} (ref >=60)
GFR, Est Non African American: 67 mL/min/{1.73_m2} (ref >=60)
Globulin: 2.1 g/dL (calc) (ref 1.9–3.7)
Glucose, Bld: 124 mg/dL — ABNORMAL HIGH (ref 65–99)
Potassium: 5.2 mmol/L (ref 3.5–5.3)
Sodium: 141 mmol/L (ref 135–146)
Total Bilirubin: 0.5 mg/dL (ref 0.2–1.2)
Total Protein: 6.7 g/dL (ref 6.1–8.1)

## 2019-03-13 LAB — LIPID PANEL W/REFLEX DIRECT LDL
Cholesterol: 224 mg/dL — ABNORMAL HIGH (ref <200)
HDL: 48 mg/dL (ref >=40)
LDL Cholesterol (Calc): 152 mg/dL (calc) — ABNORMAL HIGH
Non-HDL Cholesterol (Calc): 176 mg/dL (calc) — ABNORMAL HIGH (ref <130)
Total CHOL/HDL Ratio: 4.7 (calc) (ref <5.0)
Triglycerides: 121 mg/dL (ref <150)

## 2019-03-13 LAB — PSA: PSA: <0.1 ng/mL (ref <=4.0)

## 2019-03-13 NOTE — Assessment & Plan Note (Signed)
LDL has jumped considerably.  Very elevated now, as he did not tolerate multiple statins it may be reasonable to try Repatha, [name] will look into this and let me know.

## 2019-03-15 ENCOUNTER — Other Ambulatory Visit: Payer: Self-pay

## 2019-03-15 ENCOUNTER — Encounter: Payer: Self-pay | Admitting: Sports Medicine

## 2019-03-15 ENCOUNTER — Ambulatory Visit (INDEPENDENT_AMBULATORY_CARE_PROVIDER_SITE_OTHER): Payer: PPO | Admitting: Sports Medicine

## 2019-03-15 VITALS — BP 126/75 | HR 76 | Ht 67.0 in | Wt 168.0 lb

## 2019-03-15 DIAGNOSIS — E119 Type 2 diabetes mellitus without complications: Secondary | ICD-10-CM | POA: Insufficient documentation

## 2019-03-15 DIAGNOSIS — E782 Mixed hyperlipidemia: Secondary | ICD-10-CM

## 2019-03-15 DIAGNOSIS — R7303 Prediabetes: Secondary | ICD-10-CM

## 2019-03-15 DIAGNOSIS — R7301 Impaired fasting glucose: Secondary | ICD-10-CM | POA: Diagnosis not present

## 2019-03-15 DIAGNOSIS — Z0000 Encounter for general adult medical examination without abnormal findings: Secondary | ICD-10-CM | POA: Diagnosis not present

## 2019-03-15 LAB — POCT GLYCOSYLATED HEMOGLOBIN (HGB A1C): Hemoglobin A1C: 6.2 % — AB (ref 4.0–5.6)

## 2019-03-15 MED ORDER — SHINGRIX 50 MCG/0.5ML IM SUSR: 0.5000 mL | Freq: Once | INTRAMUSCULAR | 0 refills | Status: AC

## 2019-03-15 NOTE — Patient Instructions (Signed)
Prediabetes Eating Plan Prediabetes is a condition that causes blood sugar (glucose) levels to be higher than normal. This increases the risk for developing diabetes. In order to prevent diabetes from developing, your health care provider may recommend a diet and other lifestyle changes to help you:  Control your blood glucose levels.  Improve your cholesterol levels.  Manage your blood pressure. Your health care provider may recommend working with a diet and nutrition specialist (dietitian) to make a meal plan that is best for you. What are tips for following this plan? Lifestyle  Set weight loss goals with the help of your health care team. It is recommended that most people with prediabetes lose 7% of their current body weight.  Exercise for at least 30 minutes at least 5 days a week.  Attend a support group or seek ongoing support from a mental health counselor.  Take over-the-counter and prescription medicines only as told by your health care provider. Reading food labels  Read food labels to check the amount of fat, salt (sodium), and sugar in prepackaged foods. Avoid foods that have: ? Saturated fats. ? Trans fats. ? Added sugars.  Avoid foods that have more than 300 milligrams (mg) of sodium per serving. Limit your daily sodium intake to less than 2,300 mg each day. Shopping  Avoid buying pre-made and processed foods. Cooking  Cook with olive oil. Do not use butter, lard, or ghee.  Bake, broil, grill, or boil foods. Avoid frying. Meal planning   Work with your dietitian to develop an eating plan that is right for you. This may include: ? Tracking how many calories you take in. Use a food diary, notebook, or mobile application to track what you eat at each meal. ? Using the glycemic index (GI) to plan your meals. The index tells you how quickly a food will raise your blood glucose. Choose low-GI foods. These foods take a longer time to raise blood glucose.  Consider  following a Mediterranean diet. This diet includes: ? Several servings each day of fresh fruits and vegetables. ? Eating fish at least twice a week. ? Several servings each day of whole grains, beans, nuts, and seeds. ? Using olive oil instead of other fats. ? Moderate alcohol consumption. ? Eating small amounts of red meat and whole-fat dairy.  If you have high blood pressure, you may need to limit your sodium intake or follow a diet such as the DASH eating plan. DASH is an eating plan that aims to lower high blood pressure. What foods are recommended? The items listed below may not be a complete list. Talk with your dietitian about what dietary choices are best for you. Grains Whole grains, such as whole-wheat or whole-grain breads, crackers, cereals, and pasta. Unsweetened oatmeal. Bulgur. Barley. Quinoa. Brown rice. Corn or whole-wheat flour tortillas or taco shells. Vegetables Lettuce. Spinach. Peas. Beets. Cauliflower. Cabbage. Broccoli. Carrots. Tomatoes. Squash. Eggplant. Herbs. Peppers. Onions. Cucumbers. Brussels sprouts. Fruits Berries. Bananas. Apples. Oranges. Grapes. Papaya. Mango. Pomegranate. Kiwi. Grapefruit. Cherries. Meats and other protein foods Seafood. Poultry without skin. Lean cuts of pork and beef. Tofu. Eggs. Nuts. Beans. Dairy Low-fat or fat-free dairy products, such as yogurt, cottage cheese, and cheese. Beverages Water. Tea. Coffee. Sugar-free or diet soda. Seltzer water. Lowfat or no-fat milk. Milk alternatives, such as soy or almond milk. Fats and oils Olive oil. Canola oil. Sunflower oil. Grapeseed oil. Avocado. Walnuts. Sweets and desserts Sugar-free or low-fat pudding. Sugar-free or low-fat ice cream and other frozen treats.   Seasoning and other foods Herbs. Sodium-free spices. Mustard. Relish. Low-fat, low-sugar ketchup. Low-fat, low-sugar barbecue sauce. Low-fat or fat-free mayonnaise. What foods are not recommended? The items listed below may not be a  complete list. Talk with your dietitian about what dietary choices are best for you. Grains Refined white flour and flour products, such as bread, pasta, snack foods, and cereals. Vegetables Canned vegetables. Frozen vegetables with butter or cream sauce. Fruits Fruits canned with syrup. Meats and other protein foods Fatty cuts of meat. Poultry with skin. Breaded or fried meat. Processed meats. Dairy Full-fat yogurt, cheese, or milk. Beverages Sweetened drinks, such as sweet iced tea and soda. Fats and oils Butter. Lard. Ghee. Sweets and desserts Baked goods, such as cake, cupcakes, pastries, cookies, and cheesecake. Seasoning and other foods Spice mixes with added salt. Ketchup. Barbecue sauce. Mayonnaise. Summary  To prevent diabetes from developing, you may need to make diet and other lifestyle changes to help control blood sugar, improve cholesterol levels, and manage your blood pressure.  Set weight loss goals with the help of your health care team. It is recommended that most people with prediabetes lose 7 percent of their current body weight.  Consider following a Mediterranean diet that includes plenty of fresh fruits and vegetables, whole grains, beans, nuts, seeds, fish, lean meat, low-fat dairy, and healthy oils. This information is not intended to replace advice given to you by your health care provider. Make sure you discuss any questions you have with your health care provider. Document Revised: 04/20/2018 Document Reviewed: 03/02/2016 Elsevier Patient Education  [place].   Prediabetes Prediabetes is the condition of having a blood sugar (blood glucose) level that is higher than it should be, but not high enough for you to be diagnosed with type 2 diabetes. Having prediabetes puts you at risk for developing type 2 diabetes (type 2 diabetes mellitus). Prediabetes may be called impaired glucose tolerance or impaired fasting glucose. Prediabetes usually does not  cause symptoms. Your health care provider can diagnose this condition with blood tests. You may be tested for prediabetes if you are overweight and if you have at least one other risk factor for prediabetes. What is blood glucose, and how is it measured? Blood glucose refers to the amount of glucose in your bloodstream. Glucose comes from eating foods that contain sugars and starches (carbohydrates), which the body breaks down into glucose. Your blood glucose level may be measured in mg/dL (milligrams per deciliter) or mmol/L (millimoles per liter). Your blood glucose may be checked with one or more of the following blood tests:  A fasting blood glucose (FBG) test. You will not be allowed to eat (you will fast) for 8 hours or longer before a blood sample is taken. ? A normal range for FBG is 70-100 mg/dl (3.9-5.6 mmol/L).  An A1c (hemoglobin A1c) blood test. This test provides information about blood glucose control over the previous 2?75months.  An oral glucose tolerance test (OGTT). This test measures your blood glucose at two times: ? After fasting. This is your baseline level. ? Two hours after you drink a beverage that contains glucose. You may be diagnosed with prediabetes:  If your FBG is 100?125 mg/dL (5.6-6.9 mmol/L).  If your A1c level is 5.7?6.4%.  If your OGTT result is 140?199 mg/dL (7.8-11 mmol/L). These blood tests may be repeated to confirm your diagnosis. How can this condition affect me? The pancreas produces a hormone (insulin) that helps to move glucose from the bloodstream into cells.  When cells in the body do not respond properly to insulin that the body makes (insulin resistance), excess glucose builds up in the blood instead of going into cells. As a result, high blood glucose (hyperglycemia) can develop, which can cause many complications. Hyperglycemia is a symptom of prediabetes. Having high blood glucose for a long time is dangerous. Too much glucose in your blood  can damage your nerves and blood vessels. Long-term damage can lead to complications from diabetes, which may include:  Heart disease.  Stroke.  Blindness.  Kidney disease.  Depression.  Poor circulation in the feet and legs, which could lead to surgical removal (amputation) in severe cases. What can increase my risk? Risk factors for prediabetes include:  Having a family member with type 2 diabetes.  Being overweight or obese.  Being older than age 45.  Being of American [place], African-American, Hispanic/Latino, or Asian/Pacific Islander descent.  Having an inactive (sedentary) lifestyle.  Having a history of heart disease.  History of gestational diabetes or polycystic ovary syndrome (PCOS), in women.  Having low levels of good cholesterol (HDL-C) or high levels of blood fats (triglycerides).  Having high blood pressure. What actions can I take to prevent diabetes?      Be physically active. ? Do moderate-intensity physical activity for 30 or more minutes on 5 or more days of the week, or as much as told by your health care provider. This could be brisk walking, biking, or water aerobics. ? Ask your health care provider what activities are safe for you. A mix of physical activities may be best, such as walking, swimming, cycling, and strength training.  Lose weight as told by your health care provider. ? Losing 5-7% of your body weight can reverse insulin resistance. ? Your health care provider can determine how much weight loss is best for you and can help you lose weight safely.  Follow a healthy meal plan. This includes eating lean proteins, complex carbohydrates, fresh fruits and vegetables, low-fat dairy products, and healthy fats. ? Follow instructions from your health care provider about eating or drinking restrictions. ? Make an appointment to see a diet and nutrition specialist (registered dietitian) to help you create a healthy eating plan that is right  for you.  Do not smoke or use any tobacco products, such as cigarettes, chewing tobacco, and e-cigarettes. If you need help quitting, ask your health care provider.  Take over-the-counter and prescription medicines as told by your health care provider. You may be prescribed medicines that help lower the risk of type 2 diabetes.  Keep all follow-up visits as told by your health care provider. This is important. Summary  Prediabetes is the condition of having a blood sugar (blood glucose) level that is higher than it should be, but not high enough for you to be diagnosed with type 2 diabetes.  Having prediabetes puts you at risk for developing type 2 diabetes (type 2 diabetes mellitus).  To help prevent type 2 diabetes, make lifestyle changes such as being physically active and eating a healthy diet. Lose weight as told by your health care provider. This information is not intended to replace advice given to you by your health care provider. Make sure you discuss any questions you have with your health care provider. Document Revised: 04/20/2018 Document Reviewed: 02/17/2015 Elsevier Patient Education  [place].

## 2019-03-15 NOTE — Assessment & Plan Note (Addendum)
Hemoglobin A1c is elevated, this was obtained after fasting blood sugar in the 120s. He will continue to work on aggressive diet and exercise, we can recheck this as well in 3 months.

## 2019-03-15 NOTE — Progress Notes (Signed)
Subjective:    CC: Annual Physical Exam  HPI:  This patient is here for their annual physical  I reviewed the past medical history, family history, social history, surgical history, and allergies today and no changes were needed.  Please see the problem list section below in epic for further details.  Past Medical History: Past Medical History:  Diagnosis Date  . ADD (attention deficit disorder)   . Anxiety   . Arthritis 2016   bilateral hands  . Cancer ([place])    PROSTATE  . Diverticulitis   . Hyperlipidemia   . Hypertension   . Pain    HX OF 3 BACK SURGERIES - STILL HAS SEVERE BACK PAIN WITH PROLONGED SITTING OR STANDING  . PONV (postoperative nausea and vomiting)    NAUSEA AFTER SHOULDER SURGERY  . Reflux    MILD - NO DAILY MEDS - ANTIACID IF NEEDED   Past Surgical History: Past Surgical History:  Procedure Laterality Date  . [place], 2007  . COLON SURGERY  2008   COLON RESECTION FOR DIVERTICULITIS  . HERNIA REPAIR  1997 OR 1998   ING HERNIA REPAIR  . RIGHT SHOULDER 2013    . ROBOT ASSISTED LAPAROSCOPIC RADICAL PROSTATECTOMY N/A 02/13/2013   Procedure: ROBOTIC ASSISTED LAPAROSCOPIC RADICAL PROSTATECTOMY,, LYSIS OF PELVIC / INTRA-ABDOMINAL ADHESIONS;  Surgeon: Bernestine Amass, MD;  Location: [facility];  Service: Urology;  Laterality: N/A;   Social History: Social History   Socioeconomic History  . Marital status: Married    Spouse name: [name]  . Number of children: 2  . Years of education: Not on file  . Highest education level: Not on file  Occupational History  . Occupation: Retired  Tobacco Use  . Smoking status: Never Smoker  . Smokeless tobacco: Never Used  Substance and Sexual Activity  . Alcohol use: Yes    Comment: SOCIAL  . Drug use: No  . Sexual activity: Yes  Other Topics Concern  . Not on file  Social History Narrative  . Not on file   Social Determinants of Health   Financial Resource Strain:   . Difficulty of  Paying Living Expenses: Not on file  Food Insecurity:   . Worried About Charity fundraiser in the Last Year: Not on file  . Ran Out of Food in the Last Year: Not on file  Transportation Needs:   . Lack of Transportation (Medical): Not on file  . Lack of Transportation (Non-Medical): Not on file  Physical Activity:   . Days of Exercise per Week: Not on file  . Minutes of Exercise per Session: Not on file  Stress:   . Feeling of Stress : Not on file  Social Connections:   . Frequency of Communication with Friends and Family: Not on file  . Frequency of Social Gatherings with Friends and Family: Not on file  . Attends Religious Services: Not on file  . Active Member of Clubs or Organizations: Not on file  . Attends Archivist Meetings: Not on file  . Marital Status: Not on file   Family History: Family History  Problem Relation Age of Onset  . Heart attack Mother   . Hyperlipidemia Mother   . Hypertension Mother   . Stroke Mother   . Heart attack Father   . Hypertension Father   . Hyperlipidemia Father   . Diabetes Father   . Colon cancer Neg Hx   . Esophageal cancer  Neg Hx   . Rectal cancer Neg Hx   . Stomach cancer Neg Hx    Allergies: No Known Allergies Medications: See med rec.  Review of Systems: No headache, visual changes, nausea, vomiting, diarrhea, constipation, dizziness, abdominal pain, skin rash, fevers, chills, night sweats, swollen lymph nodes, weight loss, chest pain, body aches, joint swelling, muscle aches, shortness of breath, mood changes, visual or auditory hallucinations.  Objective:    General: Well Developed, well nourished, and in no acute distress.  Neuro: Alert and oriented x3, extra-ocular muscles intact, sensation grossly intact. Cranial nerves II through XII are intact, motor, sensory, and coordinative functions are all intact. HEENT: Normocephalic, atraumatic, pupils equal round reactive to light, neck supple, no masses, no  lymphadenopathy, thyroid nonpalpable. Oropharynx, nasopharynx, external ear canals are unremarkable. Skin: Warm and dry, no rashes noted.  Cardiac: Regular rate and rhythm, no murmurs rubs or gallops.  Respiratory: Clear to auscultation bilaterally. Not using accessory muscles, speaking in full sentences.  Abdominal: Soft, nontender, nondistended, positive bowel sounds, no masses, no organomegaly.  Musculoskeletal: Shoulder, elbow, wrist, hip, knee, ankle stable, and with full range of motion.  Impression and Recommendations:    The patient was counselled, risk factors were discussed, anticipatory guidance given.  Annual physical exam Annual physical as above. Adding Shingrix to his pharmacy.  Hyperlipidemia, mixed LDL has increased, he is not interested in [place] just yet, he would like to continue dietary and exercise modifications. We can recheck lipids in about 3 months.  Prediabetes Hemoglobin A1c is elevated, this was obtained after fasting blood sugar in the 120s. He will continue to work on aggressive diet and exercise, we can recheck this as well in 3 months.   ___________________________________________ Gwen Her. Dianah Field, M.D., ABFM., CAQSM. Primary Care and Sports Medicine [facility]  Adjunct Professor of [place] of [facility] of Medicine

## 2019-03-15 NOTE — Assessment & Plan Note (Signed)
Annual physical as above. Adding Shingrix to his pharmacy.

## 2019-03-15 NOTE — Assessment & Plan Note (Signed)
LDL has increased, he is not interested in [place] just yet, he would like to continue dietary and exercise modifications. We can recheck lipids in about 3 months.

## 2019-03-24 ENCOUNTER — Other Ambulatory Visit: Payer: Self-pay | Admitting: Sports Medicine

## 2019-03-24 DIAGNOSIS — F411 Generalized anxiety disorder: Secondary | ICD-10-CM

## 2019-04-03 ENCOUNTER — Other Ambulatory Visit: Payer: Self-pay | Admitting: Sports Medicine

## 2019-04-03 DIAGNOSIS — I10 Essential (primary) hypertension: Secondary | ICD-10-CM

## 2019-06-03 ENCOUNTER — Other Ambulatory Visit: Payer: Self-pay

## 2019-06-03 ENCOUNTER — Ambulatory Visit (INDEPENDENT_AMBULATORY_CARE_PROVIDER_SITE_OTHER): Payer: PPO | Admitting: Sports Medicine

## 2019-06-03 ENCOUNTER — Encounter: Payer: Self-pay | Admitting: Sports Medicine

## 2019-06-03 DIAGNOSIS — M67911 Unspecified disorder of synovium and tendon, right shoulder: Secondary | ICD-10-CM

## 2019-06-03 NOTE — Progress Notes (Signed)
    Procedures performed today:    Procedure: Real-time Ultrasound Guided injection of the right subacromial bursa Device: Samsung HS60  Verbal informed consent obtained.  Time-out conducted.  Noted no overlying erythema, induration, or other signs of local infection.  Skin prepped in a sterile fashion.  Local anesthesia: Topical Ethyl chloride.  With sterile technique and under real time ultrasound guidance: 1 cc Kenalog 40, 1 cc lidocaine,1 cc bupivacaine injected easily Completed without difficulty  Pain immediately resolved suggesting accurate placement of the medication.  Advised to call if fevers/chills, erythema, induration, drainage, or persistent bleeding.  Images permanently stored and available for review in the ultrasound unit.  Impression: Technically successful ultrasound guided injection.  Independent interpretation of notes and tests performed by another provider:   None.  Brief History, Exam, Impression, and Recommendations:    Rotator cuff dysfunction, right [name] is here with right shoulder pain, he has a history of a right-sided subacromial decompression, labral debridement, and glenohumeral joint loose body removal back in 2013 with Dr. Tonita Cong. He did well until recently, now having recurrence of pain over the deltoid and worse with abduction. Positive Neer's, Hawkins, empty can sign on exam. Today I performed a subacromial injection with ultrasound guidance, return to see me in a month.    ___________________________________________ [name], M.D., ABFM., CAQSM. Primary Care and [place] Instructor of [place] of [facility] of Medicine

## 2019-06-03 NOTE — Assessment & Plan Note (Signed)
[name] is here with right shoulder pain, he has a history of a right-sided subacromial decompression, labral debridement, and glenohumeral joint loose body removal back in 2013 with Dr. Tonita Cong. He did well until recently, now having recurrence of pain over the deltoid and worse with abduction. Positive Neer's, Hawkins, empty can sign on exam. Today I performed a subacromial injection with ultrasound guidance, return to see me in a month.

## 2019-06-05 DIAGNOSIS — F411 Generalized anxiety disorder: Secondary | ICD-10-CM

## 2019-06-05 MED ORDER — ALPRAZOLAM 0.25 MG PO TABS: 0.2500 mg | ORAL_TABLET | Freq: Three times a day (TID) | ORAL | 0 refills | Status: DC | PRN

## 2019-06-24 DIAGNOSIS — R7303 Prediabetes: Secondary | ICD-10-CM | POA: Diagnosis not present

## 2019-06-24 DIAGNOSIS — E782 Mixed hyperlipidemia: Secondary | ICD-10-CM | POA: Diagnosis not present

## 2019-06-24 LAB — COMPLETE METABOLIC PANEL WITH GFR
AG Ratio: 2 (calc) (ref 1.0–2.5)
ALT: 27 U/L (ref 9–46)
AST: 26 U/L (ref 10–35)
Albumin: 4.6 g/dL (ref 3.6–5.1)
Alkaline phosphatase (APISO): 43 U/L (ref 35–144)
BUN: 18 mg/dL (ref 7–25)
CO2: 31 mmol/L (ref 20–32)
Calcium: 9.7 mg/dL (ref 8.6–10.3)
Chloride: 103 mmol/L (ref 98–110)
Creat: 1.17 mg/dL (ref 0.70–1.25)
GFR, Est African American: 73 mL/min/{1.73_m2} (ref >=60)
GFR, Est Non African American: 63 mL/min/{1.73_m2} (ref >=60)
Globulin: 2.3 g/dL (calc) (ref 1.9–3.7)
Glucose, Bld: 112 mg/dL — ABNORMAL HIGH (ref 65–99)
Potassium: 5.7 mmol/L — ABNORMAL HIGH (ref 3.5–5.3)
Sodium: 140 mmol/L (ref 135–146)
Total Bilirubin: 0.5 mg/dL (ref 0.2–1.2)
Total Protein: 6.9 g/dL (ref 6.1–8.1)

## 2019-06-24 LAB — LIPID PANEL W/REFLEX DIRECT LDL
Cholesterol: 215 mg/dL — ABNORMAL HIGH (ref <200)
HDL: 63 mg/dL (ref >=40)
LDL Cholesterol (Calc): 135 mg/dL (calc) — ABNORMAL HIGH
Non-HDL Cholesterol (Calc): 152 mg/dL (calc) — ABNORMAL HIGH (ref <130)
Total CHOL/HDL Ratio: 3.4 (calc) (ref <5.0)
Triglycerides: 71 mg/dL (ref <150)

## 2019-06-25 LAB — HEMOGLOBIN A1C
Hgb A1c MFr Bld: 6 % of total Hgb — ABNORMAL HIGH (ref <5.7)
Mean Plasma Glucose: 126 (calc)
eAG (mmol/L): 7 (calc)

## 2019-07-23 DIAGNOSIS — M5416 Radiculopathy, lumbar region: Secondary | ICD-10-CM | POA: Diagnosis not present

## 2019-07-23 DIAGNOSIS — M961 Postlaminectomy syndrome, not elsewhere classified: Secondary | ICD-10-CM | POA: Diagnosis not present

## 2019-08-06 ENCOUNTER — Other Ambulatory Visit: Payer: Self-pay

## 2019-09-05 DIAGNOSIS — M5416 Radiculopathy, lumbar region: Secondary | ICD-10-CM | POA: Diagnosis not present

## 2019-11-13 DIAGNOSIS — L570 Actinic keratosis: Secondary | ICD-10-CM | POA: Diagnosis not present

## 2019-11-13 DIAGNOSIS — Z85828 Personal history of other malignant neoplasm of skin: Secondary | ICD-10-CM | POA: Diagnosis not present

## 2019-11-13 DIAGNOSIS — L821 Other seborrheic keratosis: Secondary | ICD-10-CM | POA: Diagnosis not present

## 2019-11-13 DIAGNOSIS — L820 Inflamed seborrheic keratosis: Secondary | ICD-10-CM | POA: Diagnosis not present

## 2019-11-27 DIAGNOSIS — F411 Generalized anxiety disorder: Secondary | ICD-10-CM

## 2019-11-27 MED ORDER — ALPRAZOLAM 0.25 MG PO TABS: 0.2500 mg | ORAL_TABLET | Freq: Three times a day (TID) | ORAL | 0 refills | Status: DC | PRN

## 2019-12-10 ENCOUNTER — Other Ambulatory Visit: Payer: Self-pay | Admitting: Sports Medicine

## 2020-01-03 DIAGNOSIS — Z20822 Contact with and (suspected) exposure to covid-19: Secondary | ICD-10-CM | POA: Diagnosis not present

## 2020-01-03 DIAGNOSIS — Z03818 Encounter for observation for suspected exposure to other biological agents ruled out: Secondary | ICD-10-CM | POA: Diagnosis not present

## 2020-01-28 ENCOUNTER — Other Ambulatory Visit: Payer: Self-pay | Admitting: Sports Medicine

## 2020-01-28 DIAGNOSIS — E782 Mixed hyperlipidemia: Secondary | ICD-10-CM

## 2020-02-19 ENCOUNTER — Telehealth: Payer: Self-pay

## 2020-02-19 DIAGNOSIS — R7303 Prediabetes: Secondary | ICD-10-CM

## 2020-02-19 DIAGNOSIS — C61 Malignant neoplasm of prostate: Secondary | ICD-10-CM

## 2020-02-19 NOTE — Telephone Encounter (Signed)
Patient aware to go fasting to the lab draw station as lab orders have been faxed today.

## 2020-02-19 NOTE — Telephone Encounter (Signed)
No problem, ordering all routine labs including hemoglobin A1c.

## 2020-02-19 NOTE — Telephone Encounter (Signed)
Patient called to report that he scheduled a physical in March and would like to do his labs the week prior. He mentioned wanting to check for pre-diabetes. Please put in orders.
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
## 2020-03-03 MED ORDER — ACETAZOLAMIDE 125 MG PO TABS: ORAL_TABLET | ORAL | 3 refills | Status: DC

## 2020-03-08 ENCOUNTER — Other Ambulatory Visit: Payer: Self-pay | Admitting: Sports Medicine

## 2020-03-08 DIAGNOSIS — F411 Generalized anxiety disorder: Secondary | ICD-10-CM

## 2020-03-17 DIAGNOSIS — R7303 Prediabetes: Secondary | ICD-10-CM | POA: Diagnosis not present

## 2020-03-17 DIAGNOSIS — C61 Malignant neoplasm of prostate: Secondary | ICD-10-CM | POA: Diagnosis not present

## 2020-03-18 LAB — HEMOGLOBIN A1C
Hgb A1c MFr Bld: 6.2 % of total Hgb — ABNORMAL HIGH (ref <5.7)
Mean Plasma Glucose: 131 mg/dL
eAG (mmol/L): 7.3 mmol/L

## 2020-03-18 LAB — LIPID PANEL
Cholesterol: 187 mg/dL (ref <200)
HDL: 52 mg/dL (ref >=40)
LDL Cholesterol (Calc): 114 mg/dL (calc) — ABNORMAL HIGH
Non-HDL Cholesterol (Calc): 135 mg/dL (calc) — ABNORMAL HIGH (ref <130)
Total CHOL/HDL Ratio: 3.6 (calc) (ref <5.0)
Triglycerides: 106 mg/dL (ref <150)

## 2020-03-18 LAB — COMPREHENSIVE METABOLIC PANEL
AG Ratio: 2.2 (calc) (ref 1.0–2.5)
ALT: 23 U/L (ref 9–46)
AST: 26 U/L (ref 10–35)
Albumin: 4.6 g/dL (ref 3.6–5.1)
Alkaline phosphatase (APISO): 38 U/L (ref 35–144)
BUN: 21 mg/dL (ref 7–25)
CO2: 26 mmol/L (ref 20–32)
Calcium: 9.5 mg/dL (ref 8.6–10.3)
Chloride: 104 mmol/L (ref 98–110)
Creat: 1.05 mg/dL (ref 0.70–1.18)
Globulin: 2.1 g/dL (calc) (ref 1.9–3.7)
Glucose, Bld: 109 mg/dL — ABNORMAL HIGH (ref 65–99)
Potassium: 4.8 mmol/L (ref 3.5–5.3)
Sodium: 140 mmol/L (ref 135–146)
Total Bilirubin: 0.6 mg/dL (ref 0.2–1.2)
Total Protein: 6.7 g/dL (ref 6.1–8.1)

## 2020-03-18 LAB — TSH: TSH: 2.81 mIU/L (ref 0.40–4.50)

## 2020-03-18 LAB — CBC
HCT: 44.1 % (ref 38.5–50.0)
Hemoglobin: 14.7 g/dL (ref 13.2–17.1)
MCH: 30.6 pg (ref 27.0–33.0)
MCHC: 33.3 g/dL (ref 32.0–36.0)
MCV: 91.7 fL (ref 80.0–100.0)
MPV: 10.4 fL (ref 7.5–12.5)
Platelets: 286 10*3/uL (ref 140–400)
RBC: 4.81 10*6/uL (ref 4.20–5.80)
RDW: 12.1 % (ref 11.0–15.0)
WBC: 5.3 10*3/uL (ref 3.8–10.8)

## 2020-03-18 LAB — PSA, TOTAL AND FREE
PSA, % Free: UNDETERMINED % (calc) (ref >25)
PSA, Free: <0.1 ng/mL
PSA, Total: <0.1 ng/mL (ref <=4.0)

## 2020-03-19 ENCOUNTER — Ambulatory Visit (INDEPENDENT_AMBULATORY_CARE_PROVIDER_SITE_OTHER): Payer: PPO | Admitting: Sports Medicine

## 2020-03-19 ENCOUNTER — Other Ambulatory Visit: Payer: Self-pay

## 2020-03-19 ENCOUNTER — Encounter: Payer: Self-pay | Admitting: Sports Medicine

## 2020-03-19 VITALS — BP 133/71 | HR 77 | Ht 67.0 in | Wt 164.0 lb

## 2020-03-19 DIAGNOSIS — Z0000 Encounter for general adult medical examination without abnormal findings: Secondary | ICD-10-CM

## 2020-03-19 DIAGNOSIS — M19041 Primary osteoarthritis, right hand: Secondary | ICD-10-CM

## 2020-03-19 DIAGNOSIS — M180 Bilateral primary osteoarthritis of first carpometacarpal joints: Secondary | ICD-10-CM

## 2020-03-19 DIAGNOSIS — H608X2 Other otitis externa, left ear: Secondary | ICD-10-CM | POA: Diagnosis not present

## 2020-03-19 DIAGNOSIS — M67911 Unspecified disorder of synovium and tendon, right shoulder: Secondary | ICD-10-CM | POA: Diagnosis not present

## 2020-03-19 DIAGNOSIS — Z23 Encounter for immunization: Secondary | ICD-10-CM | POA: Diagnosis not present

## 2020-03-19 MED ORDER — SHINGRIX 50 MCG/0.5ML IM SUSR: 0.5000 mL | Freq: Once | INTRAMUSCULAR | 0 refills | Status: AC

## 2020-03-19 MED ORDER — FLUOCINOLONE ACETONIDE 0.01 % OT OIL: 5.0000 [drp] | TOPICAL_OIL | Freq: Two times a day (BID) | OTIC | 6 refills | Status: DC | PRN

## 2020-03-19 NOTE — Addendum Note (Signed)
Addended by: Dema Severin on: 03/19/2020 09:33 AM   Modules accepted: Orders

## 2020-03-19 NOTE — Assessment & Plan Note (Signed)
Trying fluocinolone otic drops.

## 2020-03-19 NOTE — Assessment & Plan Note (Signed)
Return to see me tomorrow for right second MCP injection.

## 2020-03-19 NOTE — Assessment & Plan Note (Signed)
Annual physical as above, due for pneumococcal 23, prescription written for Shingrix. Labs already obtained and looked good, up-to-date on other screening measures.

## 2020-03-19 NOTE — Assessment & Plan Note (Signed)
Ronalee Belts has had several injections, I think it is time for him to see Dr. Amedeo Plenty.

## 2020-03-19 NOTE — Progress Notes (Signed)
Subjective:    CC: Annual Physical Exam  HPI:  This patient is here for their annual physical  I reviewed the past medical history, family history, social history, surgical history, and allergies today and no changes were needed.  Please see the problem list section below in epic for further details.  Past Medical History: Past Medical History:  Diagnosis Date  . ADD (attention deficit disorder)   . Anxiety   . Arthritis 2016   bilateral hands  . Cancer ([place])    PROSTATE  . Diverticulitis   . Hyperlipidemia   . Hypertension   . Pain    HX OF 3 BACK SURGERIES - STILL HAS SEVERE BACK PAIN WITH PROLONGED SITTING OR STANDING  . PONV (postoperative nausea and vomiting)    NAUSEA AFTER SHOULDER SURGERY  . Reflux    MILD - NO DAILY MEDS - ANTIACID IF NEEDED   Past Surgical History: Past Surgical History:  Procedure Laterality Date  . [place], 2007  . COLON SURGERY  2008   COLON RESECTION FOR DIVERTICULITIS  . HERNIA REPAIR  1997 OR 1998   ING HERNIA REPAIR  . RIGHT SHOULDER 2013    . ROBOT ASSISTED LAPAROSCOPIC RADICAL PROSTATECTOMY N/A 02/13/2013   Procedure: ROBOTIC ASSISTED LAPAROSCOPIC RADICAL PROSTATECTOMY,, LYSIS OF PELVIC / INTRA-ABDOMINAL ADHESIONS;  Surgeon: Bernestine Amass, MD;  Location: [facility];  Service: Urology;  Laterality: N/A;   Social History: Social History   Socioeconomic History  . Marital status: Married    Spouse name: [name]  . Number of children: 2  . Years of education: Not on file  . Highest education level: Not on file  Occupational History  . Occupation: Retired  Tobacco Use  . Smoking status: Never Smoker  . Smokeless tobacco: Never Used  Vaping Use  . Vaping Use: Never used  Substance and Sexual Activity  . Alcohol use: Yes    Comment: SOCIAL  . Drug use: No  . Sexual activity: Yes  Other Topics Concern  . Not on file  Social History Narrative  . Not on file   Social Determinants of Health   Financial  Resource Strain: Not on file  Food Insecurity: Not on file  Transportation Needs: Not on file  Physical Activity: Not on file  Stress: Not on file  Social Connections: Not on file   Family History: Family History  Problem Relation Age of Onset  . Heart attack Mother   . Hyperlipidemia Mother   . Hypertension Mother   . Stroke Mother   . Heart attack Father   . Hypertension Father   . Hyperlipidemia Father   . Diabetes Father   . Colon cancer Neg Hx   . Esophageal cancer Neg Hx   . Rectal cancer Neg Hx   . Stomach cancer Neg Hx    Allergies: No Known Allergies Medications: See med rec.  Review of Systems: No headache, visual changes, nausea, vomiting, diarrhea, constipation, dizziness, abdominal pain, skin rash, fevers, chills, night sweats, swollen lymph nodes, weight loss, chest pain, body aches, joint swelling, muscle aches, shortness of breath, mood changes, visual or auditory hallucinations.  Objective:    General: Well Developed, well nourished, and in no acute distress.  Neuro: Alert and oriented x3, extra-ocular muscles intact, sensation grossly intact. Cranial nerves II through XII are intact, motor, sensory, and coordinative functions are all intact. HEENT: Normocephalic, atraumatic, pupils equal round reactive to light, neck supple, no masses,  no lymphadenopathy, thyroid nonpalpable. Oropharynx, nasopharynx, external ear canals are unremarkable. Skin: Warm and dry, no rashes noted.  Cardiac: Regular rate and rhythm, no murmurs rubs or gallops.  Respiratory: Clear to auscultation bilaterally. Not using accessory muscles, speaking in full sentences.  Abdominal: Soft, nontender, nondistended, positive bowel sounds, no masses, no organomegaly.  Musculoskeletal: Shoulder, elbow, wrist, hip, knee, ankle stable, and with full range of motion.  Right shoulder positive Neer's, Hawkins, empty can signs.  Right second MCP swelling with pain.  Impression and Recommendations:     The patient was counselled, risk factors were discussed, anticipatory guidance given.  Annual physical exam Annual physical as above, due for pneumococcal 23, prescription written for Shingrix. Labs already obtained and looked good, up-to-date on other screening measures.  Chronic eczematous otitis externa of left ear Trying fluocinolone otic drops.  Rotator cuff dysfunction, right He has had a right-sided subacromial decompression, labral debridement, glenohumeral loose body removal back in 2013 with Dr. Tonita Cong, increasing pain with abduction, impingement signs and symptoms. I did an injection about a year ago and he did well, he will return tomorrow for repeat right subacromial injection.  Bilateral primary osteoarthritis of first carpometacarpal joints Ronalee Belts has had several injections, I think it is time for him to see Dr. Amedeo Plenty.  Primary osteoarthritis, right second MCP Return to see me tomorrow for right second MCP injection.   ___________________________________________ Gwen Her. Dianah Field, M.D., ABFM., CAQSM. Primary Care and Sports Medicine [facility]  Adjunct Professor of [place] of [facility] of Medicine

## 2020-03-19 NOTE — Assessment & Plan Note (Signed)
He has had a right-sided subacromial decompression, labral debridement, glenohumeral loose body removal back in 2013 with Dr. Tonita Cong, increasing pain with abduction, impingement signs and symptoms. I did an injection about a year ago and he did well, he will return tomorrow for repeat right subacromial injection.

## 2020-03-20 ENCOUNTER — Ambulatory Visit (INDEPENDENT_AMBULATORY_CARE_PROVIDER_SITE_OTHER): Payer: PPO | Admitting: Sports Medicine

## 2020-03-20 ENCOUNTER — Ambulatory Visit: Payer: Self-pay

## 2020-03-20 DIAGNOSIS — M19041 Primary osteoarthritis, right hand: Secondary | ICD-10-CM

## 2020-03-20 DIAGNOSIS — M67911 Unspecified disorder of synovium and tendon, right shoulder: Secondary | ICD-10-CM

## 2020-03-20 DIAGNOSIS — H608X2 Other otitis externa, left ear: Secondary | ICD-10-CM

## 2020-03-20 DIAGNOSIS — F19982 Other psychoactive substance use, unspecified with psychoactive substance-induced sleep disorder: Secondary | ICD-10-CM | POA: Insufficient documentation

## 2020-03-20 MED ORDER — FLUOCINOLONE ACETONIDE 0.01 % OT OIL: 5.0000 [drp] | TOPICAL_OIL | Freq: Two times a day (BID) | OTIC | 6 refills | Status: DC | PRN

## 2020-03-20 MED ORDER — ZOLPIDEM TARTRATE 5 MG PO TABS: 5.0000 mg | ORAL_TABLET | Freq: Every evening | ORAL | 0 refills | Status: DC | PRN

## 2020-03-20 NOTE — Progress Notes (Signed)
    Procedures performed today:    Procedure: Real-time Ultrasound Guided injection of the right second MCP Device: Samsung HS60  Verbal informed consent obtained.  Time-out conducted.  Noted no overlying erythema, induration, or other signs of local infection.  Skin prepped in a sterile fashion.  Local anesthesia: Topical Ethyl chloride.  With sterile technique and under real time ultrasound guidance:  1/2 cc lidocaine, 1/2 cc kenalog 40 injected easily.   Completed without difficulty  Advised to call if fevers/chills, erythema, induration, drainage, or persistent bleeding.  Images permanently stored and available for review in PACS.  Impression: Technically successful ultrasound guided injection.  Procedure: Real-time Ultrasound Guided injection of the right subacromial bursa Device: Samsung HS60  Verbal informed consent obtained.  Time-out conducted.  Noted no overlying erythema, induration, or other signs of local infection.  Skin prepped in a sterile fashion.  Local anesthesia: Topical Ethyl chloride.  With sterile technique and under real time ultrasound guidance:  Noted intact rotator cuff, 1 cc Kenalog 40, 1 cc lidocaine, 1 cc bupivacaine injected easily Completed without difficulty  Advised to call if fevers/chills, erythema, induration, drainage, or persistent bleeding.  Images permanently stored and available for review in PACS.  Impression: Technically successful ultrasound guided injection.  Independent interpretation of notes and tests performed by another provider:   None.  Brief History, Exam, Impression, and Recommendations:    Rotator cuff dysfunction, right Subacromial injection as above, see prior note for more details.  Primary osteoarthritis, right second MCP Right second MCP injection, see prior note for further details.    ___________________________________________ Gwen Her. Dianah Field, M.D., ABFM., CAQSM. Primary Care and [place] Instructor of [place] of [facility] of Medicine

## 2020-03-20 NOTE — Assessment & Plan Note (Signed)
[name] does tend to get some insomnia with steroid injections, I will give him some Ambien to take.

## 2020-03-20 NOTE — Assessment & Plan Note (Signed)
Subacromial injection as above, see prior note for more details.

## 2020-03-20 NOTE — Assessment & Plan Note (Signed)
Right second MCP injection, see prior note for further details.

## 2020-03-25 ENCOUNTER — Other Ambulatory Visit: Payer: Self-pay | Admitting: Sports Medicine

## 2020-03-25 DIAGNOSIS — I10 Essential (primary) hypertension: Secondary | ICD-10-CM

## 2020-04-20 DIAGNOSIS — D225 Melanocytic nevi of trunk: Secondary | ICD-10-CM | POA: Diagnosis not present

## 2020-04-20 DIAGNOSIS — D485 Neoplasm of uncertain behavior of skin: Secondary | ICD-10-CM | POA: Diagnosis not present

## 2020-04-20 DIAGNOSIS — L570 Actinic keratosis: Secondary | ICD-10-CM | POA: Diagnosis not present

## 2020-04-20 DIAGNOSIS — L814 Other melanin hyperpigmentation: Secondary | ICD-10-CM | POA: Diagnosis not present

## 2020-04-20 DIAGNOSIS — D220 Melanocytic nevi of lip: Secondary | ICD-10-CM | POA: Diagnosis not present

## 2020-04-20 DIAGNOSIS — L821 Other seborrheic keratosis: Secondary | ICD-10-CM | POA: Diagnosis not present

## 2020-04-20 DIAGNOSIS — D1801 Hemangioma of skin and subcutaneous tissue: Secondary | ICD-10-CM | POA: Diagnosis not present

## 2020-04-20 DIAGNOSIS — Z85828 Personal history of other malignant neoplasm of skin: Secondary | ICD-10-CM | POA: Diagnosis not present

## 2020-05-27 ENCOUNTER — Encounter (INDEPENDENT_AMBULATORY_CARE_PROVIDER_SITE_OTHER): Payer: PPO

## 2020-05-27 DIAGNOSIS — F19982 Other psychoactive substance use, unspecified with psychoactive substance-induced sleep disorder: Secondary | ICD-10-CM

## 2020-05-27 MED ORDER — ZOLPIDEM TARTRATE ER 6.25 MG PO TBCR: 6.2500 mg | EXTENDED_RELEASE_TABLET | Freq: Every evening | ORAL | 0 refills | Status: DC | PRN

## 2020-05-27 NOTE — Telephone Encounter (Signed)
See other message

## 2020-05-27 NOTE — Assessment & Plan Note (Signed)
Inadequate response to regular low-dose Ambien, switching to 6.25 mg extended release Ambien, he does typically get insomnia with steroid injections.

## 2020-05-27 NOTE — Telephone Encounter (Signed)
I spent 5 total minutes of online digital evaluation and management services. 

## 2020-05-30 ENCOUNTER — Other Ambulatory Visit: Payer: Self-pay | Admitting: Sports Medicine

## 2020-06-03 ENCOUNTER — Encounter (INDEPENDENT_AMBULATORY_CARE_PROVIDER_SITE_OTHER): Payer: PPO

## 2020-06-03 DIAGNOSIS — F5101 Primary insomnia: Secondary | ICD-10-CM | POA: Diagnosis not present

## 2020-06-03 MED ORDER — TRAZODONE HCL 50 MG PO TABS: 50.0000 mg | ORAL_TABLET | Freq: Every evening | ORAL | 3 refills | Status: DC | PRN

## 2020-06-03 NOTE — Assessment & Plan Note (Signed)
Ambien not working, switching to trazodone 50 with the option to go up to 100 mg in a month if needed.

## 2020-06-03 NOTE — Telephone Encounter (Signed)
I spent 5 total minutes of online digital evaluation and management services. 

## 2020-06-12 ENCOUNTER — Encounter: Payer: Self-pay | Admitting: Sports Medicine

## 2020-06-12 ENCOUNTER — Ambulatory Visit (INDEPENDENT_AMBULATORY_CARE_PROVIDER_SITE_OTHER): Payer: PPO

## 2020-06-12 ENCOUNTER — Ambulatory Visit (INDEPENDENT_AMBULATORY_CARE_PROVIDER_SITE_OTHER): Payer: PPO | Admitting: Sports Medicine

## 2020-06-12 ENCOUNTER — Other Ambulatory Visit: Payer: Self-pay

## 2020-06-12 DIAGNOSIS — F411 Generalized anxiety disorder: Secondary | ICD-10-CM | POA: Diagnosis not present

## 2020-06-12 DIAGNOSIS — M5030 Other cervical disc degeneration, unspecified cervical region: Secondary | ICD-10-CM

## 2020-06-12 DIAGNOSIS — F5101 Primary insomnia: Secondary | ICD-10-CM

## 2020-06-12 DIAGNOSIS — M542 Cervicalgia: Secondary | ICD-10-CM

## 2020-06-12 MED ORDER — ALPRAZOLAM 0.25 MG PO TABS: ORAL_TABLET | ORAL | 3 refills | Status: DC

## 2020-06-12 MED ORDER — PREDNISONE 50 MG PO TABS: ORAL_TABLET | ORAL | 0 refills | Status: DC

## 2020-06-12 NOTE — Assessment & Plan Note (Signed)
Ambien ineffective, trazodone ineffective, amitriptyline and melatonin only slightly efficacious. He has noted that taking a half of a 0.25 mg alprazolam seems to help, happy to do this long-term, he does understand that there will be some tachyphylaxis and if I do see him requesting higher doses more frequently we will have the discussion. I have also advised him to take melatonin approximately 30 minutes prior to sunset.

## 2020-06-12 NOTE — Assessment & Plan Note (Signed)
[name] has severe multilevel cervical DDD, we treated this back in 2020 with prednisone and rehab exercises and this resolved his pain completely. Ready to try this again, no progressive weakness related to his radiculitis, no bowel or bladder dysfunction, no constitutional symptoms, no trauma. Return to see me if no better in a month we will proceed with MRI for interventional planning.

## 2020-06-12 NOTE — Progress Notes (Signed)
    Procedures performed today:    None.  Independent interpretation of notes and tests performed by another provider:   None.  Brief History, Exam, Impression, and Recommendations:    DDD (degenerative disc disease), cervical Ronalee Belts has severe multilevel cervical DDD, we treated this back in 2020 with prednisone and rehab exercises and this resolved his pain completely. Ready to try this again, no progressive weakness related to his radiculitis, no bowel or bladder dysfunction, no constitutional symptoms, no trauma. Return to see me if no better in a month we will proceed with MRI for interventional planning.  Primary insomnia Ambien ineffective, trazodone ineffective, amitriptyline and melatonin only slightly efficacious. He has noted that taking a half of a 0.25 mg alprazolam seems to help, happy to do this long-term, he does understand that there will be some tachyphylaxis and if I do see him requesting higher doses more frequently we will have the discussion. I have also advised him to take melatonin approximately 30 minutes prior to sunset.    ___________________________________________ Gwen Her. Dianah Field, M.D., ABFM., CAQSM. Primary Care and [place] Instructor of [place] of [facility] of Medicine

## 2020-06-27 DIAGNOSIS — M5416 Radiculopathy, lumbar region: Secondary | ICD-10-CM | POA: Diagnosis not present

## 2020-07-06 ENCOUNTER — Encounter (INDEPENDENT_AMBULATORY_CARE_PROVIDER_SITE_OTHER): Payer: PPO

## 2020-07-06 DIAGNOSIS — B356 Tinea cruris: Secondary | ICD-10-CM

## 2020-07-06 MED ORDER — CLOTRIMAZOLE-BETAMETHASONE 1-0.05 % EX CREA: 1.0000 "application " | TOPICAL_CREAM | Freq: Two times a day (BID) | CUTANEOUS | 0 refills | Status: DC

## 2020-07-06 NOTE — Telephone Encounter (Signed)
I spent 5 total minutes of online digital evaluation and management services. 

## 2020-07-17 ENCOUNTER — Encounter (INDEPENDENT_AMBULATORY_CARE_PROVIDER_SITE_OTHER): Payer: PPO

## 2020-07-17 DIAGNOSIS — K5790 Diverticulosis of intestine, part unspecified, without perforation or abscess without bleeding: Secondary | ICD-10-CM | POA: Diagnosis not present

## 2020-07-19 MED ORDER — CIPROFLOXACIN HCL 750 MG PO TABS: 750.0000 mg | ORAL_TABLET | Freq: Two times a day (BID) | ORAL | 0 refills | Status: AC

## 2020-07-19 MED ORDER — METRONIDAZOLE 500 MG PO TABS: 500.0000 mg | ORAL_TABLET | Freq: Two times a day (BID) | ORAL | 0 refills | Status: AC

## 2020-07-19 NOTE — Telephone Encounter (Signed)
I spent 5 total minutes of online digital evaluation and management services. 

## 2020-07-19 NOTE — Assessment & Plan Note (Signed)
History of diverticulosis with diverticulitis post sigmoidectomy, increasing symptoms of diverticulitis, adding Cipro/Flagyl for 7 days, empirical treatment, follow-up with me if no better in a week

## 2020-10-16 ENCOUNTER — Encounter (INDEPENDENT_AMBULATORY_CARE_PROVIDER_SITE_OTHER): Payer: PPO

## 2020-10-16 DIAGNOSIS — M5030 Other cervical disc degeneration, unspecified cervical region: Secondary | ICD-10-CM | POA: Diagnosis not present

## 2020-10-16 NOTE — Telephone Encounter (Signed)
I spent 5 total minutes of online digital evaluation and management services. 

## 2020-11-02 DIAGNOSIS — M542 Cervicalgia: Secondary | ICD-10-CM | POA: Diagnosis not present

## 2020-11-25 ENCOUNTER — Other Ambulatory Visit: Payer: Self-pay | Admitting: Sports Medicine

## 2020-11-25 NOTE — Telephone Encounter (Signed)
07/09/20 note that med was only slightly efficacious. Should we continue?

## 2020-11-27 ENCOUNTER — Encounter: Payer: Self-pay | Admitting: Sports Medicine

## 2020-11-27 MED ORDER — AMITRIPTYLINE HCL 25 MG PO TABS: ORAL_TABLET | ORAL | 0 refills | Status: DC

## 2020-11-30 DIAGNOSIS — M961 Postlaminectomy syndrome, not elsewhere classified: Secondary | ICD-10-CM | POA: Diagnosis not present

## 2020-11-30 DIAGNOSIS — M47812 Spondylosis without myelopathy or radiculopathy, cervical region: Secondary | ICD-10-CM | POA: Diagnosis not present

## 2020-11-30 DIAGNOSIS — M5136 Other intervertebral disc degeneration, lumbar region: Secondary | ICD-10-CM | POA: Diagnosis not present

## 2020-12-09 NOTE — Telephone Encounter (Signed)
Left msg that prescription was sent to pharmacy as requested.  °

## 2020-12-10 ENCOUNTER — Encounter: Payer: Self-pay | Admitting: Sports Medicine

## 2020-12-11 ENCOUNTER — Ambulatory Visit (INDEPENDENT_AMBULATORY_CARE_PROVIDER_SITE_OTHER): Payer: PPO | Admitting: Sports Medicine

## 2020-12-11 ENCOUNTER — Other Ambulatory Visit: Payer: Self-pay

## 2020-12-11 DIAGNOSIS — Z0000 Encounter for general adult medical examination without abnormal findings: Secondary | ICD-10-CM

## 2020-12-11 DIAGNOSIS — I10 Essential (primary) hypertension: Secondary | ICD-10-CM

## 2020-12-11 DIAGNOSIS — F411 Generalized anxiety disorder: Secondary | ICD-10-CM

## 2020-12-11 DIAGNOSIS — E782 Mixed hyperlipidemia: Secondary | ICD-10-CM

## 2020-12-11 MED ORDER — SERTRALINE HCL 25 MG PO TABS: 25.0000 mg | ORAL_TABLET | Freq: Every day | ORAL | 3 refills | Status: DC

## 2020-12-11 MED ORDER — IRBESARTAN 300 MG PO TABS: 300.0000 mg | ORAL_TABLET | Freq: Every day | ORAL | 3 refills | Status: DC

## 2020-12-11 MED ORDER — FENOFIBRATE 160 MG PO TABS: 160.0000 mg | ORAL_TABLET | Freq: Every day | ORAL | 3 refills | Status: DC

## 2020-12-11 MED ORDER — AMITRIPTYLINE HCL 25 MG PO TABS: ORAL_TABLET | ORAL | 3 refills | Status: DC

## 2020-12-11 NOTE — Assessment & Plan Note (Signed)
[name] returns, he needs medications refilled, I saw him in June, everythings stable, I will no charge this visit and refill the medications, return to see me next summer.

## 2020-12-11 NOTE — Progress Notes (Signed)
    Procedures performed today:    None.  Independent interpretation of notes and tests performed by another provider:   None.  Brief History, Exam, Impression, and Recommendations:    Annual physical exam [name] returns, he needs medications refilled, I saw him in June, everythings stable, I will no charge this visit and refill the medications, return to see me next summer.    ___________________________________________ Gwen Her. Dianah Field, M.D., ABFM., CAQSM. Primary Care and [place] Instructor of [place] of [facility] of Medicine

## 2020-12-17 DIAGNOSIS — M47812 Spondylosis without myelopathy or radiculopathy, cervical region: Secondary | ICD-10-CM | POA: Diagnosis not present

## 2021-01-02 ENCOUNTER — Other Ambulatory Visit: Payer: Self-pay | Admitting: Sports Medicine

## 2021-01-02 MED ORDER — NIRMATRELVIR/RITONAVIR (PAXLOVID)TABLET: ORAL_TABLET | ORAL | 0 refills | Status: DC

## 2021-01-02 NOTE — Telephone Encounter (Signed)
This is a pleasant 70 year old male, viral sx, home covid swab pos.  Called in paxlovid

## 2021-01-11 ENCOUNTER — Encounter: Payer: Self-pay | Admitting: Sports Medicine

## 2021-01-21 DIAGNOSIS — M542 Cervicalgia: Secondary | ICD-10-CM | POA: Diagnosis not present

## 2021-03-03 ENCOUNTER — Ambulatory Visit (INDEPENDENT_AMBULATORY_CARE_PROVIDER_SITE_OTHER): Payer: PPO | Admitting: Sports Medicine

## 2021-03-03 ENCOUNTER — Other Ambulatory Visit: Payer: Self-pay

## 2021-03-03 DIAGNOSIS — L219 Seborrheic dermatitis, unspecified: Secondary | ICD-10-CM | POA: Diagnosis not present

## 2021-03-03 MED ORDER — TRIAMCINOLONE ACETONIDE 0.05 % EX OINT: TOPICAL_OINTMENT | CUTANEOUS | 3 refills | Status: DC

## 2021-03-03 MED ORDER — HYDROCORTISONE 2.5 % EX OINT: TOPICAL_OINTMENT | CUTANEOUS | 3 refills | Status: DC

## 2021-03-03 NOTE — Progress Notes (Signed)
° ° °  Procedures performed today:    None.  Independent interpretation of notes and tests performed by another provider:   None.  Brief History, Exam, Impression, and Recommendations:    Seborrheic dermatitis Erythematous, waxy scaly lesions left upper eyelid, right nasolabial fold. This appears to be classic seborrheic dermatitis, adding hydrocortisone low potency for the eyelid, applied 2-3 times daily. Triamcinolone 0.05% cream for the nasolabial fold twice a day. Return to see me as needed  Chronic process with exacerbation and pharmacologic intervention  ___________________________________________ Gwen Her. Dianah Field, M.D., ABFM., CAQSM. Primary Care and [place] Instructor of [place] of [facility] of Medicine

## 2021-03-03 NOTE — Assessment & Plan Note (Signed)
Erythematous, waxy scaly lesions left upper eyelid, right nasolabial fold. This appears to be classic seborrheic dermatitis, adding hydrocortisone low potency for the eyelid, applied 2-3 times daily. Triamcinolone 0.05% cream for the nasolabial fold twice a day. Return to see me as needed

## 2021-03-04 ENCOUNTER — Encounter: Payer: Self-pay | Admitting: Sports Medicine

## 2021-03-04 DIAGNOSIS — L219 Seborrheic dermatitis, unspecified: Secondary | ICD-10-CM

## 2021-03-04 MED ORDER — TRIAMCINOLONE ACETONIDE 0.1 % EX CREA: TOPICAL_CREAM | CUTANEOUS | 0 refills | Status: DC

## 2021-03-09 DIAGNOSIS — M5136 Other intervertebral disc degeneration, lumbar region: Secondary | ICD-10-CM | POA: Diagnosis not present

## 2021-03-09 DIAGNOSIS — M5416 Radiculopathy, lumbar region: Secondary | ICD-10-CM | POA: Diagnosis not present

## 2021-03-09 DIAGNOSIS — M961 Postlaminectomy syndrome, not elsewhere classified: Secondary | ICD-10-CM | POA: Diagnosis not present

## 2021-03-09 DIAGNOSIS — M47812 Spondylosis without myelopathy or radiculopathy, cervical region: Secondary | ICD-10-CM | POA: Diagnosis not present

## 2021-03-11 ENCOUNTER — Encounter: Payer: Self-pay | Admitting: Sports Medicine

## 2021-03-11 DIAGNOSIS — R7303 Prediabetes: Secondary | ICD-10-CM

## 2021-03-11 DIAGNOSIS — I10 Essential (primary) hypertension: Secondary | ICD-10-CM

## 2021-03-11 DIAGNOSIS — C61 Malignant neoplasm of prostate: Secondary | ICD-10-CM

## 2021-03-18 ENCOUNTER — Encounter: Payer: Self-pay | Admitting: Sports Medicine

## 2021-03-18 DIAGNOSIS — M5416 Radiculopathy, lumbar region: Secondary | ICD-10-CM | POA: Diagnosis not present

## 2021-03-19 DIAGNOSIS — C61 Malignant neoplasm of prostate: Secondary | ICD-10-CM | POA: Diagnosis not present

## 2021-03-19 DIAGNOSIS — I10 Essential (primary) hypertension: Secondary | ICD-10-CM | POA: Diagnosis not present

## 2021-03-19 DIAGNOSIS — R7303 Prediabetes: Secondary | ICD-10-CM | POA: Diagnosis not present

## 2021-03-21 ENCOUNTER — Encounter: Payer: Self-pay | Admitting: Sports Medicine

## 2021-03-22 ENCOUNTER — Ambulatory Visit (INDEPENDENT_AMBULATORY_CARE_PROVIDER_SITE_OTHER): Payer: PPO | Admitting: Sports Medicine

## 2021-03-22 ENCOUNTER — Other Ambulatory Visit: Payer: Self-pay

## 2021-03-22 VITALS — BP 172/79 | HR 77 | Wt 166.0 lb

## 2021-03-22 DIAGNOSIS — I10 Essential (primary) hypertension: Secondary | ICD-10-CM | POA: Diagnosis not present

## 2021-03-22 DIAGNOSIS — R7303 Prediabetes: Secondary | ICD-10-CM | POA: Diagnosis not present

## 2021-03-22 DIAGNOSIS — Z0000 Encounter for general adult medical examination without abnormal findings: Secondary | ICD-10-CM

## 2021-03-22 LAB — CBC
HCT: 42 % (ref 38.5–50.0)
Hemoglobin: 13.9 g/dL (ref 13.2–17.1)
MCH: 30.8 pg (ref 27.0–33.0)
MCHC: 33.1 g/dL (ref 32.0–36.0)
MCV: 92.9 fL (ref 80.0–100.0)
MPV: 10.5 fL (ref 7.5–12.5)
Platelets: 309 10*3/uL (ref 140–400)
RBC: 4.52 10*6/uL (ref 4.20–5.80)
RDW: 12.8 % (ref 11.0–15.0)
WBC: 16.7 10*3/uL — ABNORMAL HIGH (ref 3.8–10.8)

## 2021-03-22 LAB — COMPREHENSIVE METABOLIC PANEL
AG Ratio: 2 (calc) (ref 1.0–2.5)
ALT: 20 U/L (ref 9–46)
AST: 22 U/L (ref 10–35)
Albumin: 4.6 g/dL (ref 3.6–5.1)
Alkaline phosphatase (APISO): 36 U/L (ref 35–144)
BUN: 25 mg/dL (ref 7–25)
CO2: 28 mmol/L (ref 20–32)
Calcium: 9.5 mg/dL (ref 8.6–10.3)
Chloride: 103 mmol/L (ref 98–110)
Creat: 1.05 mg/dL (ref 0.70–1.28)
Globulin: 2.3 g/dL (calc) (ref 1.9–3.7)
Glucose, Bld: 117 mg/dL — ABNORMAL HIGH (ref 65–99)
Potassium: 4.7 mmol/L (ref 3.5–5.3)
Sodium: 139 mmol/L (ref 135–146)
Total Bilirubin: 0.4 mg/dL (ref 0.2–1.2)
Total Protein: 6.9 g/dL (ref 6.1–8.1)

## 2021-03-22 LAB — HEMOGLOBIN A1C
Hgb A1c MFr Bld: 6.3 % of total Hgb — ABNORMAL HIGH (ref <5.7)
Mean Plasma Glucose: 134 mg/dL
eAG (mmol/L): 7.4 mmol/L

## 2021-03-22 LAB — LIPID PANEL
Cholesterol: 191 mg/dL (ref <200)
HDL: 55 mg/dL (ref >=40)
LDL Cholesterol (Calc): 121 mg/dL (calc) — ABNORMAL HIGH
Non-HDL Cholesterol (Calc): 136 mg/dL (calc) — ABNORMAL HIGH (ref <130)
Total CHOL/HDL Ratio: 3.5 (calc) (ref <5.0)
Triglycerides: 61 mg/dL (ref <150)

## 2021-03-22 LAB — TSH: TSH: 0.96 mIU/L (ref 0.40–4.50)

## 2021-03-22 LAB — PSA, TOTAL AND FREE
PSA, % Free: UNDETERMINED % (calc) (ref >25)
PSA, Free: <0.1 ng/mL
PSA, Total: <0.1 ng/mL (ref <=4.0)

## 2021-03-22 MED ORDER — METFORMIN HCL ER 500 MG PO TB24: 500.0000 mg | ORAL_TABLET | Freq: Every day | ORAL | 3 refills | Status: DC

## 2021-03-22 NOTE — Assessment & Plan Note (Signed)
Annual physical as above, healthy male, up-to-date on screenings. ?

## 2021-03-22 NOTE — Progress Notes (Signed)
Subjective:   [name] is a 71 y.o. male who presents for Medicare Annual/Subsequent preventive examination.  Review of Systems    Comprehensive review of systems unremarkable    Objective:    Today's Vitals   03/22/21 0855 03/22/21 0914  BP: (!) 158/81 (!) 172/79  Pulse: 83 77  SpO2: 99% 96%  Weight: 166 lb 1.9 oz (75.4 kg)    Body mass index is 26.02 kg/m.  Advanced Directives 02/13/2013 05/11/2011 05/10/2011  Does Patient Have a Medical Advance Directive? Patient has advance directive, copy not in chart - Patient has advance directive, copy not in chart  Type of Advance Directive Living will - -  Does patient want to make changes to medical advance directive? No - -  Copy of Healthcare Power of Attorney in Chart? Copy requested from family - -  Pre-existing out of facility DNR order (yellow form or pink MOST form) No No -    Current Medications (verified) Outpatient Encounter Medications as of 03/22/2021  Medication Sig   acetaZOLAMIDE (DIAMOX) 125 MG tablet TAKE 1 TABLET BY MOUTH TWICE A DAY *START 24 HOURS BEFORE DAY OF ASSENT AND DISCONTINUE 2 TO 3 DAYS AFTER PEAK ARRIVAL OR UPON DESCENT   ALPRAZolam (XANAX) 0.25 MG tablet 1 tab p.o. twice daily to 3 times daily for anxiety, one half tab p.o. nightly as needed for insomnia   amitriptyline (ELAVIL) 25 MG tablet TAKE 1 TABLET BY MOUTH EVERYDAY AT BEDTIME   cholecalciferol (VITAMIN D) 1000 units tablet Take 1,000 Units by mouth daily.   Coenzyme Q10 300 MG CAPS Take 300 mg by mouth daily.   fenofibrate 160 MG tablet Take 1 tablet (160 mg total) by mouth daily.   hydrocortisone 2.5 % ointment Apply 2-3 times daily to left upper eyelid   irbesartan (AVAPRO) 300 MG tablet Take 1 tablet (300 mg total) by mouth daily.   metFORMIN (GLUCOPHAGE-XR) 500 MG 24 hr tablet Take 1 tablet (500 mg total) by mouth daily with breakfast.   metroNIDAZOLE (METROGEL) 0.75 % gel Apply 1 application topically 2 (two) times daily.   Multiple  Vitamin (MULTI VITAMIN DAILY PO) Take by mouth.   Omega-3 Fatty Acids (FISH OIL) 1200 MG CAPS Take 2 capsules by mouth daily.   Psyllium 43 % POWD Take 1 scoop by mouth daily.   sertraline (ZOLOFT) 25 MG tablet Take 1 tablet (25 mg total) by mouth daily.   triamcinolone cream (KENALOG) 0.1 % Apply to ears and nasolabial folds twice a day   TURMERIC PO Take 800 mg by mouth daily.   Facility-Administered Encounter Medications as of 03/22/2021  Medication   0.9 %  sodium chloride infusion    Allergies (verified) Patient has no known allergies.   History: Past Medical History:  Diagnosis Date   ADD (attention deficit disorder)    Anxiety    Arthritis 2016   bilateral hands   Cancer ([place])    PROSTATE   Diverticulitis    Hyperlipidemia    Hypertension    Pain    HX OF 3 BACK SURGERIES - STILL HAS SEVERE BACK PAIN WITH PROLONGED SITTING OR STANDING   PONV (postoperative nausea and vomiting)    NAUSEA AFTER SHOULDER SURGERY   Reflux    MILD - NO DAILY MEDS - ANTIACID IF NEEDED   Past Surgical History:  Procedure Laterality Date   BACK SURGERY     X3  1993, 1998, 2007   COLON SURGERY  2008  COLON RESECTION FOR DIVERTICULITIS   HERNIA REPAIR  1997 OR 1998   [place] HERNIA REPAIR   RIGHT SHOULDER 2013     ROBOT ASSISTED LAPAROSCOPIC RADICAL PROSTATECTOMY N/A 02/13/2013   Procedure: ROBOTIC ASSISTED LAPAROSCOPIC RADICAL PROSTATECTOMY,, LYSIS OF PELVIC / INTRA-ABDOMINAL ADHESIONS;  Surgeon: Bernestine Amass, MD;  Location: [facility];  Service: Urology;  Laterality: N/A;   Family History  Problem Relation Age of Onset   Heart attack Mother    Hyperlipidemia Mother    Hypertension Mother    Stroke Mother    Heart attack Father    Hypertension Father    Hyperlipidemia Father    Diabetes Father    Colon cancer Neg Hx    Esophageal cancer Neg Hx    Rectal cancer Neg Hx    Stomach cancer Neg Hx    Social History   Socioeconomic History   Marital status: Married    Spouse name:  [name]   Number of children: 2   Years of education: Not on file   Highest education level: Not on file  Occupational History   Occupation: Retired  Tobacco Use   Smoking status: Never   Smokeless tobacco: Never  Vaping Use   Vaping Use: Never used  Substance and Sexual Activity   Alcohol use: Yes    Comment: SOCIAL   Drug use: No   Sexual activity: Yes  Other Topics Concern   Not on file  Social History Narrative   Not on file   Social Determinants of Health   Financial Resource Strain: Not on file  Food Insecurity: Not on file  Transportation Needs: Not on file  Physical Activity: Not on file  Stress: Not on file  Social Connections: Not on file    Tobacco Counseling Counseling given: Not Answered   Diabetic?No  Activities of Daily Living Good with all ADLs and IADLs  Patient Care Team: Silverio Decamp, MD as PCP - General (Sports Medicine)  Indicate any recent Medical Services you may have received from other than [facility] providers in the past year (date may be approximate).     Assessment:   This is a routine wellness examination for Tyriq.  Hearing/Vision screen No results found.  Dietary issues and exercise activities discussed:     Goals Addressed   None   Depression Screen PHQ 2/9 Scores 03/22/2021 03/19/2020 03/15/2019 03/13/2018 11/22/2017 10/25/2017 09/22/2017  PHQ - 2 Score 0 0 0 '1 1 1 3  '$ PHQ- 9 Score - - '3 4 3 5 9    '$ Fall Risk Fall Risk  03/22/2021 03/19/2020 08/06/2019 11/30/2018 08/09/2018  Falls in the past year? 0 0 0 0 (No Data)  Comment - - [facility] Telephone Survey: data to providers prior to load [employer] Telephone Survey: data to providers prior to load [employer] Telephone Survey: data to providers prior to load  Number falls in past yr: 0 0 - - (No Data)  Comment - - - - [facility] Telephone Survey Actual Response =   Injury with Fall? 0 0 - - -    FALL RISK PREVENTION PERTAINING TO THE HOME:  Any stairs in or around the home? Yes  If so, are  there any without handrails? No  Home free of loose throw rugs in walkways, pet beds, electrical cords, etc? Yes  Adequate lighting in your home to reduce risk of falls? Yes   ASSISTIVE DEVICES UTILIZED TO PREVENT FALLS:  Life alert? No  Use of a cane, walker or w/c?  No  Grab bars in the bathroom? Yes  Shower chair or bench in shower? No  Elevated toilet seat or a handicapped toilet? No   TIMED UP AND GO:  Was the test performed?  Not needed .   Gait steady and fast without use of assistive device  Cognitive Function: Normal      Immunizations Immunization History  Administered Date(s) Administered   Fluad Quad(high Dose 65+) 09/26/2019   Influenza, High Dose Seasonal PF 09/05/2016   Influenza, Quadrivalent, Recombinant, Inj, Pf 10/15/2018   Influenza,inj,Quad PF,6+ Mos 08/25/2017   Influenza-Unspecified 10/11/2014, 09/20/2015, 09/24/2015, 10/05/2020   PFIZER Comirnaty(Gray Top)Covid-19 Tri-Sucrose Vaccine 04/17/2020   PFIZER(Purple Top)SARS-COV-2 Vaccination 01/31/2019, 02/21/2019, 10/06/2019   Pfizer Covid-19 Vaccine Bivalent Booster 57yr & up 10/05/2020   Pneumococcal Conjugate-13 02/12/2016   Pneumococcal Polysaccharide-23 03/19/2020   Pneumococcal-Unspecified 10/11/2014   Tdap 07/03/2006, 03/07/2017   Zoster Recombinat (Shingrix) 05/26/2020, 08/05/2020   Zoster, Live 10/07/2010    TDAP status: Up to date  Flu Vaccine status: Due, Education has been provided regarding the importance of this vaccine. Advised may receive this vaccine at local pharmacy or Health Dept. Aware to provide a copy of the vaccination record if obtained from local pharmacy or Health Dept. Verbalized acceptance and understanding.  Pneumococcal vaccine status: Up to date  Covid-19 vaccine status: Completed vaccines  Qualifies for Shingles Vaccine? No   Zostavax completed No   Shingrix Completed?: Yes  Screening Tests Health Maintenance  Topic Date Due   COLONOSCOPY (Pts 45-463yr Insurance coverage will need to be confirmed)  06/10/2026   TETANUS/TDAP  03/08/2027   Pneumonia Vaccine 72Years old  Completed   INFLUENZA VACCINE  Completed   COVID-19 Vaccine  Completed   Hepatitis C Screening  Completed   Zoster Vaccines- Shingrix  Completed   HPV VACCINES  Aged Out    Health Maintenance  There are no preventive care reminders to display for this patient.   Community Resource Referral / Chronic Care Management: CRR required this visit?  No   CCM required this visit?  No     Plan:     I have personally reviewed and noted the following in the patients chart:   Medical and social history Use of alcohol, tobacco or illicit drugs  Current medications and supplements including opioid prescriptions. Patient is not currently taking opioid prescriptions. Functional ability and status Nutritional status Physical activity Advanced directives List of other physicians Hospitalizations, surgeries, and ER visits in previous 12 months Vitals Screenings to include cognitive, depression, and falls Referrals and appointments  In addition, I have reviewed and discussed with patient certain preventive protocols, quality metrics, and best practice recommendations. A written personalized care plan for preventive services as well as general preventive health recommendations were provided to patient.  Annual physical exam Annual physical as above, healthy male, up-to-date on screenings.  Prediabetes Mild glucose intolerance, adding metformin 500 XR, recheck A1c in 3 months in a nurse visit.  Essential hypertension, benign Blood pressure elevated in the office, he does have a whitecoat component, typically runs 130s over 70s at home.   ___________________________________________ ThGwen HerThDianah FieldM.D., ABFM., CAQSM. Primary Care and SpLone Rocknstructor of Fa[facility] Medicine

## 2021-03-22 NOTE — Assessment & Plan Note (Addendum)
Mild glucose intolerance, adding metformin 500 XR, recheck A1c in 3 months in a nurse visit. ?

## 2021-03-22 NOTE — Assessment & Plan Note (Signed)
Blood pressure elevated in the office, he does have a whitecoat component, typically runs 130s over 70s at home. ?

## 2021-03-30 ENCOUNTER — Encounter: Payer: Self-pay | Admitting: Sports Medicine

## 2021-03-31 ENCOUNTER — Encounter: Payer: Self-pay | Admitting: Sports Medicine

## 2021-04-01 DIAGNOSIS — I10 Essential (primary) hypertension: Secondary | ICD-10-CM | POA: Diagnosis not present

## 2021-04-01 DIAGNOSIS — M542 Cervicalgia: Secondary | ICD-10-CM | POA: Diagnosis not present

## 2021-04-01 DIAGNOSIS — M5416 Radiculopathy, lumbar region: Secondary | ICD-10-CM | POA: Diagnosis not present

## 2021-04-01 DIAGNOSIS — Z6826 Body mass index (BMI) 26.0-26.9, adult: Secondary | ICD-10-CM | POA: Diagnosis not present

## 2021-04-07 ENCOUNTER — Other Ambulatory Visit: Payer: Self-pay | Admitting: Neurological Surgery

## 2021-04-07 ENCOUNTER — Encounter: Payer: Self-pay | Admitting: Sports Medicine

## 2021-04-07 DIAGNOSIS — M5416 Radiculopathy, lumbar region: Secondary | ICD-10-CM

## 2021-04-07 DIAGNOSIS — M542 Cervicalgia: Secondary | ICD-10-CM

## 2021-04-26 ENCOUNTER — Ambulatory Visit
Admission: RE | Admit: 2021-04-26 | Discharge: 2021-04-26 | Disposition: A | Discharge status: Home / Self Care | Payer: PPO | Source: Ambulatory Visit | Attending: Neurological Surgery | Admitting: Neurological Surgery

## 2021-04-26 DIAGNOSIS — M5137 Other intervertebral disc degeneration, lumbosacral region: Secondary | ICD-10-CM | POA: Diagnosis not present

## 2021-04-26 DIAGNOSIS — M9973 Connective tissue and disc stenosis of intervertebral foramina of lumbar region: Secondary | ICD-10-CM | POA: Diagnosis not present

## 2021-04-26 DIAGNOSIS — M5416 Radiculopathy, lumbar region: Secondary | ICD-10-CM

## 2021-04-26 DIAGNOSIS — M5126 Other intervertebral disc displacement, lumbar region: Secondary | ICD-10-CM | POA: Diagnosis not present

## 2021-04-26 DIAGNOSIS — M47816 Spondylosis without myelopathy or radiculopathy, lumbar region: Secondary | ICD-10-CM | POA: Diagnosis not present

## 2021-04-26 MED ORDER — GADOBENATE DIMEGLUMINE 529 MG/ML IV SOLN
15.0000 mL | Freq: Once | INTRAVENOUS | Status: AC | PRN
  Administered 2021-04-26: 15 mL via INTRAVENOUS

## 2021-04-29 ENCOUNTER — Ambulatory Visit
Admission: RE | Admit: 2021-04-29 | Discharge: 2021-04-29 | Disposition: A | Discharge status: Home / Self Care | Payer: PPO | Source: Ambulatory Visit | Attending: Neurological Surgery | Admitting: Neurological Surgery

## 2021-04-29 DIAGNOSIS — Z85828 Personal history of other malignant neoplasm of skin: Secondary | ICD-10-CM | POA: Diagnosis not present

## 2021-04-29 DIAGNOSIS — L820 Inflamed seborrheic keratosis: Secondary | ICD-10-CM | POA: Diagnosis not present

## 2021-04-29 DIAGNOSIS — M2578 Osteophyte, vertebrae: Secondary | ICD-10-CM | POA: Diagnosis not present

## 2021-04-29 DIAGNOSIS — D1801 Hemangioma of skin and subcutaneous tissue: Secondary | ICD-10-CM | POA: Diagnosis not present

## 2021-04-29 DIAGNOSIS — L718 Other rosacea: Secondary | ICD-10-CM | POA: Diagnosis not present

## 2021-04-29 DIAGNOSIS — D485 Neoplasm of uncertain behavior of skin: Secondary | ICD-10-CM | POA: Diagnosis not present

## 2021-04-29 DIAGNOSIS — M542 Cervicalgia: Secondary | ICD-10-CM | POA: Diagnosis not present

## 2021-04-29 DIAGNOSIS — L570 Actinic keratosis: Secondary | ICD-10-CM | POA: Diagnosis not present

## 2021-04-29 DIAGNOSIS — R200 Anesthesia of skin: Secondary | ICD-10-CM | POA: Diagnosis not present

## 2021-04-29 DIAGNOSIS — L2089 Other atopic dermatitis: Secondary | ICD-10-CM | POA: Diagnosis not present

## 2021-04-29 DIAGNOSIS — L821 Other seborrheic keratosis: Secondary | ICD-10-CM | POA: Diagnosis not present

## 2021-04-30 ENCOUNTER — Encounter: Payer: Self-pay | Admitting: Sports Medicine

## 2021-05-04 DIAGNOSIS — Z6826 Body mass index (BMI) 26.0-26.9, adult: Secondary | ICD-10-CM | POA: Diagnosis not present

## 2021-05-04 DIAGNOSIS — M542 Cervicalgia: Secondary | ICD-10-CM | POA: Diagnosis not present

## 2021-05-21 DIAGNOSIS — M542 Cervicalgia: Secondary | ICD-10-CM | POA: Diagnosis not present

## 2021-05-21 DIAGNOSIS — M4802 Spinal stenosis, cervical region: Secondary | ICD-10-CM | POA: Diagnosis not present

## 2021-05-28 ENCOUNTER — Encounter: Payer: Self-pay | Admitting: Sports Medicine

## 2021-05-28 DIAGNOSIS — F411 Generalized anxiety disorder: Secondary | ICD-10-CM

## 2021-05-28 MED ORDER — ALPRAZOLAM 0.25 MG PO TABS: ORAL_TABLET | ORAL | 3 refills | Status: DC

## 2021-06-22 ENCOUNTER — Ambulatory Visit: Payer: PPO

## 2021-06-22 DIAGNOSIS — R7303 Prediabetes: Secondary | ICD-10-CM | POA: Diagnosis not present

## 2021-06-22 NOTE — Progress Notes (Signed)
Lab only 

## 2021-06-23 ENCOUNTER — Encounter: Payer: Self-pay | Admitting: Sports Medicine

## 2021-06-23 LAB — HEMOGLOBIN A1C
Hgb A1c MFr Bld: 6.4 % of total Hgb — ABNORMAL HIGH (ref <5.7)
Mean Plasma Glucose: 137 mg/dL
eAG (mmol/L): 7.6 mmol/L

## 2021-06-23 NOTE — Progress Notes (Signed)
This encounter was created in error - please disregard.

## 2021-06-23 NOTE — Progress Notes (Unsigned)
This encounter was created in error - please disregard.  This encounter was created in error - please disregard.

## 2021-06-28 ENCOUNTER — Encounter: Payer: Self-pay | Admitting: Sports Medicine

## 2021-06-28 DIAGNOSIS — F411 Generalized anxiety disorder: Secondary | ICD-10-CM

## 2021-06-28 MED ORDER — ALPRAZOLAM 0.5 MG PO TABS: ORAL_TABLET | ORAL | 3 refills | Status: DC

## 2021-07-02 ENCOUNTER — Ambulatory Visit (INDEPENDENT_AMBULATORY_CARE_PROVIDER_SITE_OTHER): Payer: PPO | Admitting: Sports Medicine

## 2021-07-02 ENCOUNTER — Telehealth: Payer: Self-pay | Admitting: Sports Medicine

## 2021-07-02 ENCOUNTER — Encounter: Payer: Self-pay | Admitting: Sports Medicine

## 2021-07-02 ENCOUNTER — Ambulatory Visit (INDEPENDENT_AMBULATORY_CARE_PROVIDER_SITE_OTHER): Payer: PPO

## 2021-07-02 VITALS — BP 133/70 | HR 78 | Resp 18 | Ht 67.0 in | Wt 162.0 lb

## 2021-07-02 DIAGNOSIS — R7303 Prediabetes: Secondary | ICD-10-CM

## 2021-07-02 DIAGNOSIS — N5239 Other post-surgical erectile dysfunction: Secondary | ICD-10-CM

## 2021-07-02 DIAGNOSIS — M1711 Unilateral primary osteoarthritis, right knee: Secondary | ICD-10-CM | POA: Diagnosis not present

## 2021-07-02 MED ORDER — TADALAFIL 5 MG PO TABS: 5.0000 mg | ORAL_TABLET | Freq: Every day | ORAL | 11 refills | Status: AC

## 2021-07-02 NOTE — Progress Notes (Signed)
    Procedures performed today:    Procedure: Real-time Ultrasound Guided injection of the right knee Device: Samsung HS60  Verbal informed consent obtained.  Time-out conducted.  Noted no overlying erythema, induration, or other signs of local infection.  Skin prepped in a sterile fashion.  Local anesthesia: Topical Ethyl chloride.  With sterile technique and under real time ultrasound guidance: Mild effusion noted, 1 cc Kenalog  40, 2 cc lidocaine , 2 cc bupivacaine  injected easily Completed without difficulty  Advised to call if fevers/chills, erythema, induration, drainage, or persistent bleeding.  Images permanently stored and available for review in PACS.  Impression: Technically successful ultrasound guided injection.  Independent interpretation of notes and tests performed by another provider:   None.  Brief History, Exam, Impression, and Recommendations:    Primary osteoarthritis of right knee Knee osteoarthritis. Steroid injection today, NSAIDs ineffective, analgesics ineffective. X-rays. We will also get him approved for viscosupplementation. For insurance purposes he has failed greater than 6 weeks of physician directed conservative treatment.  Post-surgical erectile dysfunction Restarting Cialis  daily, of note he has tried multiple modalities including sildenafil, Cialis , Levitra, Stendra, they were historically all too expensive. Prostaglandin shots and pellets were also too expensive. Penis ring somewhat effective historically.  Prediabetes Not tolerating metformin , he also lost some weight which he is not tremendously happy about. A1c really did not change. Having some diarrhea still. Discontinue metformin .    ___________________________________________ Debby PARAS. Curtis, M.D., ABFM., CAQSM. Primary Care and Sports Medicine [facility]  Adjunct Instructor of Family Medicine  [employer]

## 2021-07-02 NOTE — Assessment & Plan Note (Signed)
Knee osteoarthritis. Steroid injection today, NSAIDs ineffective, analgesics ineffective. X-rays. We will also get him approved for viscosupplementation. For insurance purposes he has failed greater than 6 weeks of physician directed conservative treatment.

## 2021-07-02 NOTE — Assessment & Plan Note (Signed)
 Restarting Cialis  daily, of note he has tried multiple modalities including sildenafil, Cialis , Levitra, Stendra, they were historically all too expensive. Prostaglandin shots and pellets were also too expensive. Penis ring somewhat effective historically.

## 2021-07-02 NOTE — Telephone Encounter (Signed)
 FYI for what typically happens with my patients with high blood pressures in the office.  Since this was done today I have added it to his vitals for today.  Serendipitous since we just talked about it.

## 2021-07-02 NOTE — Assessment & Plan Note (Signed)
 Not tolerating metformin , he also lost some weight which he is not tremendously happy about. A1c really did not change. Having some diarrhea still. Discontinue metformin .

## 2021-07-02 NOTE — Telephone Encounter (Signed)
Please work on Orthovisc for the right knee.

## 2021-07-06 DIAGNOSIS — M5416 Radiculopathy, lumbar region: Secondary | ICD-10-CM | POA: Diagnosis not present

## 2021-07-06 DIAGNOSIS — M542 Cervicalgia: Secondary | ICD-10-CM | POA: Diagnosis not present

## 2021-07-08 NOTE — Telephone Encounter (Signed)
[employer] paperwork faxed to [employer] at [phone] Request is for Orthovisc Pt's insurance prefers Orthovisc Fax confirmation receipt received  

## 2021-07-15 NOTE — Telephone Encounter (Signed)
Benefits Investigation Details received from [employer] Injection: Orthovisc  Medical: Deductible does not apply. One the OOP is met, patient is covered at 100%. Only one copay per DOS.  PA required: No  Pharmacy: Drug is not covered under pharmacy plan.   [facility]ty [facility]  May fill through: [facility] [place] Copay/Coinsurance:  Product Copay: 20% ($140) Administration Coinsurance:  Administration Copay: $25 Deductible:  Out of Pocket Max: $3200 (met: [id number])    Patient's approximate weekly cost is $165.   Patient aware of the cost but is on vacation. He will call back to schedule when he returns.

## 2021-07-22 ENCOUNTER — Encounter: Payer: Self-pay | Admitting: Sports Medicine

## 2021-08-02 DIAGNOSIS — E119 Type 2 diabetes mellitus without complications: Secondary | ICD-10-CM | POA: Diagnosis not present

## 2021-08-02 DIAGNOSIS — H2513 Age-related nuclear cataract, bilateral: Secondary | ICD-10-CM | POA: Diagnosis not present

## 2021-08-02 LAB — HM DIABETES EYE EXAM

## 2021-08-27 ENCOUNTER — Encounter: Payer: Self-pay | Admitting: Sports Medicine

## 2021-09-03 ENCOUNTER — Ambulatory Visit (INDEPENDENT_AMBULATORY_CARE_PROVIDER_SITE_OTHER): Payer: PPO | Admitting: Orthopaedic Surgery

## 2021-09-03 ENCOUNTER — Other Ambulatory Visit: Payer: Self-pay | Admitting: Sports Medicine

## 2021-09-03 ENCOUNTER — Ambulatory Visit (INDEPENDENT_AMBULATORY_CARE_PROVIDER_SITE_OTHER): Payer: PPO

## 2021-09-03 DIAGNOSIS — M1811 Unilateral primary osteoarthritis of first carpometacarpal joint, right hand: Secondary | ICD-10-CM | POA: Diagnosis not present

## 2021-09-03 DIAGNOSIS — M79641 Pain in right hand: Secondary | ICD-10-CM

## 2021-09-03 DIAGNOSIS — I10 Essential (primary) hypertension: Secondary | ICD-10-CM

## 2021-09-03 MED ORDER — IBUPROFEN 800 MG PO TABS: 800.0000 mg | ORAL_TABLET | Freq: Three times a day (TID) | ORAL | 2 refills | Status: DC | PRN

## 2021-09-03 MED ORDER — LIDOCAINE HCL 1 % IJ SOLN
0.3000 mL | INTRAMUSCULAR | Status: AC | PRN
  Administered 2021-09-03: .3 mL

## 2021-09-03 MED ORDER — METHYLPREDNISOLONE ACETATE 40 MG/ML IJ SUSP
13.3300 mg | INTRAMUSCULAR | Status: AC | PRN
  Administered 2021-09-03: 13.33 mg

## 2021-09-03 MED ORDER — BUPIVACAINE HCL 0.5 % IJ SOLN
0.3300 mL | INTRAMUSCULAR | Status: AC | PRN
  Administered 2021-09-03: .33 mL

## 2021-09-03 NOTE — Progress Notes (Signed)
Office Visit Note   Patient: [name]           Date of Birth: Jun 03, 1950           MRN: [id number] Visit Date: 09/03/2021              Requested by: Silverio Decamp, [place] [address],  [place] [id number] PCP: Silverio Decamp, MD   Assessment & Plan: Visit Diagnoses:  1. Primary osteoarthritis of first carpometacarpal joint of right hand     Plan: Impression is right thumb CMC osteoarthritis and right index finger MCP osteoarthritis flareup.  He underwent an ultrasound-guided injection into the index finger MCP by Dr. Dianah Field which gave him some relief but this was short-lived.  For the thumb [facility] OA this is fairly severe.  Quite a bit of erosion to the thumb metacarpal base from trapezium.  Based on options he would like to try cortisone injection for the thumb [facility] today.  We will put him on prescription strength Advil for the index finger.   Follow-Up Instructions: No follow-ups on file.   Orders:  Orders Placed This Encounter  Procedures   XR Hand Complete Right   Meds ordered this encounter  Medications   ibuprofen (ADVIL) 800 MG tablet    Sig: Take 1 tablet (800 mg total) by mouth every 8 (eight) hours as needed.    Dispense:  30 tablet    Refill:  2      Procedures: Hand/UE Inj: R thumb CMC for osteoarthritis on 09/03/2021 8:42 PM Indications: pain Details: 25 G needle Medications: 0.3 mL lidocaine 1 %; 0.33 mL bupivacaine 0.5 %; 13.33 mg methylPREDNISolone acetate 40 MG/ML Outcome: tolerated well, no immediate complications      Clinical Data: No additional findings.   Subjective: Chief Complaint  Patient presents with   Right Hand - Pain    HPI Mr. Brodowski comes in today for right thumb pain that started about a year ago and it is getting worse.  Has pain that shoots into the wrist as well.  Has constant pain in the base of the thumb with any use of the hand such as grasping or pinching or gripping.  He also  feels swelling over the index finger MCP joint.  Advil helps dull the pain.  Review of Systems  Constitutional: Negative.   All other systems reviewed and are negative.    Objective: Vital Signs: There were no vitals taken for this visit.  Physical Exam Vitals and nursing note reviewed.  Constitutional:      Appearance: He is well-developed.  HENT:     Head: Normocephalic and atraumatic.  Eyes:     Pupils: Pupils are equal, round, and reactive to light.  Pulmonary:     Effort: Pulmonary effort is normal.  Abdominal:     Palpations: Abdomen is soft.  Musculoskeletal:        General: Normal range of motion.     Cervical back: Neck supple.  Skin:    General: Skin is warm.  Neurological:     Mental Status: He is alert and oriented to person, place, and time.  Psychiatric:        Behavior: Behavior normal.        Thought Content: Thought content normal.        Judgment: Judgment normal.     Ortho Exam Examination of right hand shows pain and crepitus with grind test of the thumb.  He  has mild to moderate swelling of the right index MCP joint.  Range of motion is supple.  There is no triggering.  Normal capillary refill. Specialty Comments:  No specialty comments available.  Imaging: XR Hand Complete Right  Result Date: 09/03/2021 Severe thumb CMC arthritis with erosion into the first metacarpal base    PMFS History: Patient Active Problem List   Diagnosis Date Noted   Primary osteoarthritis of first carpometacarpal joint of right hand 09/03/2021   Primary osteoarthritis of right knee 07/02/2021   Seborrheic dermatitis 03/03/2021   Tinea cruris 07/06/2020   Primary insomnia 06/03/2020   Substance or medication-induced sleep disorder, insomnia type ([place]) 03/20/2020   Chronic eczematous otitis externa of left ear 03/19/2020   Rotator cuff dysfunction, right 06/03/2019   Prediabetes 03/15/2019   DDD (degenerative disc disease), cervical 03/06/2018    Erythro-telangiectatic rosacea 08/25/2017   Trigger ring finger of right hand 03/31/2017   Hyperlipidemia, mixed 03/08/2017   Squamous cell carcinoma in situ 09/05/2016   Generalized anxiety disorder 01/01/2016   Postlaminectomy syndrome, lumbar region 11/16/2015   Bilateral primary osteoarthritis of first carpometacarpal joints 11/16/2015   Post-surgical erectile dysfunction 11/16/2015   Diverticulosis 02/13/2015   Benign paroxysmal positional vertigo 02/02/2015   Annual physical exam 01/29/2015   Essential hypertension, benign 01/29/2015   Trigger middle finger of right hand 01/14/2015   Primary osteoarthritis, right second MCP 01/10/2014   Malignant neoplasm of prostate ([place]) 02/13/2013   Past Medical History:  Diagnosis Date   ADD (attention deficit disorder)    Anxiety    Arthritis 2016   bilateral hands   Cancer ([place])    PROSTATE   Diverticulitis    Hyperlipidemia    Hypertension    Pain    HX OF 3 BACK SURGERIES - STILL HAS SEVERE BACK PAIN WITH PROLONGED SITTING OR STANDING   PONV (postoperative nausea and vomiting)    NAUSEA AFTER SHOULDER SURGERY   Reflux    MILD - NO DAILY MEDS - ANTIACID IF NEEDED    Family History  Problem Relation Age of Onset   Heart attack Mother    Hyperlipidemia Mother    Hypertension Mother    Stroke Mother    Heart attack Father    Hypertension Father    Hyperlipidemia Father    Diabetes Father    Colon cancer Neg Hx    Esophageal cancer Neg Hx    Rectal cancer Neg Hx    Stomach cancer Neg Hx     Past Surgical History:  Procedure Laterality Date   BACK SURGERY     X3  1993, 1998, 2007   COLON SURGERY  2008   COLON RESECTION FOR [place] OR 1998   [place]   RIGHT SHOULDER 2013     ROBOT ASSISTED LAPAROSCOPIC RADICAL PROSTATECTOMY N/A 02/13/2013   Procedure: ROBOTIC ASSISTED LAPAROSCOPIC RADICAL PROSTATECTOMY,, LYSIS OF PELVIC / INTRA-ABDOMINAL ADHESIONS;  Surgeon: Bernestine Amass, MD;   Location: [facility];  Service: Urology;  Laterality: N/A;   Social History   Occupational History   Occupation: Retired  Tobacco Use   Smoking status: Never   Smokeless tobacco: Never  Vaping Use   Vaping Use: Never used  Substance and Sexual Activity   Alcohol use: Yes    Comment: SOCIAL   Drug use: No   Sexual activity: Yes

## 2021-10-12 ENCOUNTER — Encounter: Payer: Self-pay | Admitting: Sports Medicine

## 2021-10-13 ENCOUNTER — Ambulatory Visit (INDEPENDENT_AMBULATORY_CARE_PROVIDER_SITE_OTHER): Payer: PPO | Admitting: Sports Medicine

## 2021-10-13 ENCOUNTER — Encounter: Payer: Self-pay | Admitting: Sports Medicine

## 2021-10-13 DIAGNOSIS — R7303 Prediabetes: Secondary | ICD-10-CM

## 2021-10-13 DIAGNOSIS — F320 Major depressive disorder, single episode, mild: Secondary | ICD-10-CM | POA: Diagnosis not present

## 2021-10-13 DIAGNOSIS — F411 Generalized anxiety disorder: Secondary | ICD-10-CM

## 2021-10-13 DIAGNOSIS — C61 Malignant neoplasm of prostate: Secondary | ICD-10-CM | POA: Diagnosis not present

## 2021-10-13 LAB — POCT URINALYSIS DIP (CLINITEK)
Bilirubin, UA: NEGATIVE
Blood, UA: NEGATIVE
Glucose, UA: NEGATIVE mg/dL
Ketones, POC UA: NEGATIVE mg/dL
Leukocytes, UA: NEGATIVE
Nitrite, UA: NEGATIVE
POC PROTEIN,UA: NEGATIVE
Spec Grav, UA: 1.02 (ref 1.010–1.025)
Urobilinogen, UA: 0.2 E.U./dL
pH, UA: 7.5 (ref 5.0–8.0)

## 2021-10-13 LAB — POCT GLYCOSYLATED HEMOGLOBIN (HGB A1C): HbA1c, POC (prediabetic range): 6.1 % (ref 5.7–6.4)

## 2021-10-13 MED ORDER — VORTIOXETINE HBR 10 MG PO TABS: 10.0000 mg | ORAL_TABLET | Freq: Every day | ORAL | 3 refills | Status: DC

## 2021-10-13 MED ORDER — SERTRALINE HCL 50 MG PO TABS: 50.0000 mg | ORAL_TABLET | Freq: Every day | ORAL | 3 refills | Status: DC

## 2021-10-13 NOTE — Progress Notes (Signed)
    Procedures performed today:    None.  Independent interpretation of notes and tests performed by another provider:   None.  Brief History, Exam, Impression, and Recommendations:    Malignant neoplasm of prostate Increasing urinary odor, no actual dysuria, urgency, frequency, pain. Urinalysis is negative, we will simply observe, he can hydrate aggressively to clear the odor.  Prediabetes Did not tolerate metformin, rechecking hemoglobin A1c.   Generalized anxiety with depression Historically did well with sertraline however continues to have significant sexual dysfunction and anorgasmia, switching to Trintellix. May go ahead and discontinue sertraline and amitriptyline when he gets his Trintellix and follow-up in 6 weeks for dose titration.  I spent 30 minutes of total time managing this patient today, this includes chart review, face to face, and non-face to face time.  ____________________________________________ Gwen Her. Dianah Field, M.D., ABFM., CAQSM., AME. Primary Care and Sports Medicine [facility]  Adjunct Professor of [place] [employer] [facility] [employer]  Risk manager

## 2021-10-13 NOTE — Assessment & Plan Note (Signed)
Historically did well with sertraline however continues to have significant sexual dysfunction and anorgasmia, switching to Trintellix. May go ahead and discontinue sertraline and amitriptyline when he gets his Trintellix and follow-up in 6 weeks for dose titration.

## 2021-10-13 NOTE — Assessment & Plan Note (Signed)
Increasing urinary odor, no actual dysuria, urgency, frequency, pain. Urinalysis is negative, we will simply observe, he can hydrate aggressively to clear the odor.

## 2021-10-13 NOTE — Assessment & Plan Note (Signed)
Did not tolerate metformin, rechecking hemoglobin A1c.

## 2021-10-16 ENCOUNTER — Encounter: Payer: Self-pay | Admitting: Sports Medicine

## 2021-11-01 ENCOUNTER — Encounter: Payer: Self-pay | Admitting: Sports Medicine

## 2021-11-05 ENCOUNTER — Other Ambulatory Visit: Payer: Self-pay | Admitting: Sports Medicine

## 2021-11-05 DIAGNOSIS — F411 Generalized anxiety disorder: Secondary | ICD-10-CM

## 2021-11-05 DIAGNOSIS — F320 Major depressive disorder, single episode, mild: Secondary | ICD-10-CM

## 2021-11-11 DIAGNOSIS — M13831 Other specified arthritis, right wrist: Secondary | ICD-10-CM | POA: Diagnosis not present

## 2021-11-11 DIAGNOSIS — M65321 Trigger finger, right index finger: Secondary | ICD-10-CM | POA: Diagnosis not present

## 2021-11-11 DIAGNOSIS — M6530 Trigger finger, unspecified finger: Secondary | ICD-10-CM | POA: Diagnosis not present

## 2021-11-23 ENCOUNTER — Encounter: Payer: Self-pay | Admitting: Sports Medicine

## 2021-11-23 ENCOUNTER — Ambulatory Visit (INDEPENDENT_AMBULATORY_CARE_PROVIDER_SITE_OTHER): Payer: PPO | Admitting: Sports Medicine

## 2021-11-23 DIAGNOSIS — F320 Major depressive disorder, single episode, mild: Secondary | ICD-10-CM | POA: Diagnosis not present

## 2021-11-23 MED ORDER — VORTIOXETINE HBR 10 MG PO TABS: 10.0000 mg | ORAL_TABLET | Freq: Every day | ORAL | 3 refills | Status: DC

## 2021-11-23 NOTE — Assessment & Plan Note (Signed)
Doing much better with the increase sertraline, he was unable to get Trintellix, I will resubmit this prescription in the hopes that he can get it filled. He was having significant anorgasmia and sexual dysfunction on amitriptyline and Zoloft.

## 2021-11-23 NOTE — Progress Notes (Signed)
    Procedures performed today:    None.  Independent interpretation of notes and tests performed by another provider:   None.  Brief History, Exam, Impression, and Recommendations:    Generalized anxiety with depression Doing much better with the increase sertraline, he was unable to get Trintellix, I will resubmit this prescription in the hopes that he can get it filled. He was having significant anorgasmia and sexual dysfunction on amitriptyline and Zoloft.    ____________________________________________ Gwen Her. Dianah Field, M.D., ABFM., CAQSM., AME. Primary Care and Sports Medicine [facility] [place] [facility]  Adjunct Professor of [place] [employer] [facility] [employer] [employer] of Medicine  Risk manager

## 2021-11-24 ENCOUNTER — Encounter: Payer: Self-pay | Admitting: Sports Medicine

## 2021-11-24 DIAGNOSIS — F320 Major depressive disorder, single episode, mild: Secondary | ICD-10-CM

## 2021-11-27 MED ORDER — VORTIOXETINE HBR 10 MG PO TABS: 10.0000 mg | ORAL_TABLET | Freq: Every day | ORAL | 3 refills | Status: DC

## 2021-11-28 ENCOUNTER — Other Ambulatory Visit: Payer: Self-pay | Admitting: Sports Medicine

## 2021-12-01 ENCOUNTER — Encounter: Payer: Self-pay | Admitting: Sports Medicine

## 2021-12-08 NOTE — Telephone Encounter (Signed)
Called patient to get him scheduled for an appointment and he stated that he just started a new medicine and he was going to give it 2 weeks to see if it works for him. He will make a appointment with Dr. Darene Lamer regardless, after the 2 week time span. tvt

## 2021-12-09 DIAGNOSIS — M65321 Trigger finger, right index finger: Secondary | ICD-10-CM | POA: Diagnosis not present

## 2021-12-10 ENCOUNTER — Encounter: Payer: Self-pay | Admitting: Sports Medicine

## 2021-12-16 ENCOUNTER — Ambulatory Visit: Payer: PPO | Admitting: Sports Medicine

## 2021-12-29 ENCOUNTER — Encounter: Payer: Self-pay | Admitting: Sports Medicine

## 2021-12-29 ENCOUNTER — Ambulatory Visit (INDEPENDENT_AMBULATORY_CARE_PROVIDER_SITE_OTHER): Payer: PPO | Admitting: Sports Medicine

## 2021-12-29 VITALS — BP 131/70 | HR 70 | Wt 166.0 lb

## 2021-12-29 DIAGNOSIS — F320 Major depressive disorder, single episode, mild: Secondary | ICD-10-CM

## 2021-12-29 MED ORDER — VILAZODONE HCL 20 MG PO TABS: 20.0000 mg | ORAL_TABLET | Freq: Every day | ORAL | 11 refills | Status: DC

## 2021-12-29 MED ORDER — VILAZODONE HCL 10 MG PO TABS: ORAL_TABLET | ORAL | 0 refills | Status: DC

## 2021-12-29 NOTE — Progress Notes (Signed)
    Procedures performed today:    None.  Independent interpretation of notes and tests performed by another provider:   None.  Brief History, Exam, Impression, and Recommendations:    Generalized anxiety with depression This is a very pleasant 71 year old male, we have been treating him longitudinally for anxiety, historically did well with sertraline but unfortunately had significant anorgasmia. We switched to Trintellix, this helped his anorgasmia, his mood, but he did notice some sluggishness. Switched back to sertraline. I think we should go ahead and try Viibryd. Return to see me after 4 to 6 weeks on Viibryd. He also has some insomnia, continue amitriptyline for now though it can also cause sexual dysfunction. We have tried other medications such as Ambien which did not work.  Chronic process not at goal with pharmacologic intervention  ____________________________________________ Gwen Her. Dianah Field, M.D., ABFM., CAQSM., AME. Primary Care and Sports Medicine  [facility]  Adjunct Professor of [place] of [facility] of Medicine  Risk manager

## 2021-12-29 NOTE — Assessment & Plan Note (Signed)
This is a very pleasant 71 year old male, we have been treating him longitudinally for anxiety, historically did well with sertraline but unfortunately had significant anorgasmia. We switched to Trintellix, this helped his anorgasmia, his mood, but he did notice some sluggishness. Switched back to sertraline. I think we should go ahead and try Viibryd. Return to see me after 4 to 6 weeks on Viibryd. He also has some insomnia, continue amitriptyline for now though it can also cause sexual dysfunction. We have tried other medications such as Ambien which did not work.

## 2021-12-30 ENCOUNTER — Encounter: Payer: Self-pay | Admitting: Sports Medicine

## 2021-12-31 ENCOUNTER — Encounter: Payer: Self-pay | Admitting: Sports Medicine

## 2022-01-01 ENCOUNTER — Other Ambulatory Visit: Payer: Self-pay | Admitting: Sports Medicine

## 2022-01-01 DIAGNOSIS — E782 Mixed hyperlipidemia: Secondary | ICD-10-CM

## 2022-01-07 ENCOUNTER — Encounter: Payer: Self-pay | Admitting: Sports Medicine

## 2022-01-11 ENCOUNTER — Encounter: Payer: Self-pay | Admitting: Sports Medicine

## 2022-01-11 MED ORDER — AMITRIPTYLINE HCL 25 MG PO TABS: 25.0000 mg | ORAL_TABLET | Freq: Every day | ORAL | 3 refills | Status: DC

## 2022-01-11 NOTE — Telephone Encounter (Signed)
Amitriptyline rx is not listed in active med list.

## 2022-01-12 ENCOUNTER — Encounter: Payer: Self-pay | Admitting: Sports Medicine

## 2022-01-20 ENCOUNTER — Encounter: Payer: Self-pay | Admitting: Sports Medicine

## 2022-01-20 DIAGNOSIS — F320 Major depressive disorder, single episode, mild: Secondary | ICD-10-CM

## 2022-01-20 MED ORDER — VILAZODONE HCL 20 MG PO TABS: 20.0000 mg | ORAL_TABLET | Freq: Every day | ORAL | 3 refills | Status: DC

## 2022-02-02 ENCOUNTER — Encounter: Payer: Self-pay | Admitting: Sports Medicine

## 2022-02-02 ENCOUNTER — Ambulatory Visit (INDEPENDENT_AMBULATORY_CARE_PROVIDER_SITE_OTHER): Payer: PPO | Admitting: Sports Medicine

## 2022-02-02 VITALS — BP 154/80 | HR 72

## 2022-02-02 DIAGNOSIS — F320 Major depressive disorder, single episode, mild: Secondary | ICD-10-CM

## 2022-02-02 NOTE — Assessment & Plan Note (Signed)
[name] returns, he is a pleasant 72 year old male with significant anxiety, we have been treating him longitudinally, historically did well with sertraline but had significant anorgasmia, switched to Trintellix which helped his anorgasmia, mood but he did notice some sluggishness and fog, as well as some insomnia. Went back to sertraline for a while, ultimately he was able to get Viibryd, currently on 20 mg daily, excellent improvement in mood and sleep but unfortunately is back to having significant anorgasmia. He will give it another 4 weeks to see how things go, he has 2 options, 1 option is to go up on the [place] for additional control of his anxiety, the other option is to discontinue Viibryd and a down taper and switch back to Trintellix, he only took Trintellix for a week or so, I would like him to take it longer and uses Xanax to sleep and give the due diligence of 6 to 8 weeks before we consider discontinuing Trintellix again.

## 2022-02-02 NOTE — Progress Notes (Signed)
    Procedures performed today:    None.  Independent interpretation of notes and tests performed by another provider:   None.  Brief History, Exam, Impression, and Recommendations:    Generalized anxiety with depression Ronalee Belts returns, he is a pleasant 72 year old male with significant anxiety, we have been treating him longitudinally, historically did well with sertraline but had significant anorgasmia, switched to Trintellix which helped his anorgasmia, mood but he did notice some sluggishness and fog, as well as some insomnia. Went back to sertraline for a while, ultimately he was able to get Viibryd, currently on 20 mg daily, excellent improvement in mood and sleep but unfortunately is back to having significant anorgasmia. He will give it another 4 weeks to see how things go, he has 2 options, 1 option is to go up on the Viibryd for additional control of his anxiety, the other option is to discontinue Viibryd and a down taper and switch back to Trintellix, he only took Trintellix for a week or so, I would like him to take it longer and uses Xanax to sleep and give the due diligence of 6 to 8 weeks before we consider discontinuing Trintellix again.    ____________________________________________ Gwen Her. Dianah Field, M.D., ABFM., CAQSM., AME. Primary Care and Sports Medicine  [facility]  Adjunct Professor of [place] of [employer] of Medicine  Risk manager

## 2022-02-07 ENCOUNTER — Encounter: Payer: Self-pay | Admitting: Sports Medicine

## 2022-02-14 ENCOUNTER — Other Ambulatory Visit: Payer: Self-pay | Admitting: Sports Medicine

## 2022-02-14 ENCOUNTER — Encounter: Payer: Self-pay | Admitting: Sports Medicine

## 2022-02-14 DIAGNOSIS — E782 Mixed hyperlipidemia: Secondary | ICD-10-CM

## 2022-02-19 ENCOUNTER — Encounter: Payer: Self-pay | Admitting: Sports Medicine

## 2022-02-21 MED ORDER — IBUPROFEN 800 MG PO TABS: 800.0000 mg | ORAL_TABLET | Freq: Three times a day (TID) | ORAL | 3 refills | Status: DC | PRN

## 2022-03-17 ENCOUNTER — Encounter: Payer: Self-pay | Admitting: Sports Medicine

## 2022-03-17 DIAGNOSIS — F320 Major depressive disorder, single episode, mild: Secondary | ICD-10-CM

## 2022-03-17 MED ORDER — VILAZODONE HCL 40 MG PO TABS: 40.0000 mg | ORAL_TABLET | Freq: Every day | ORAL | 0 refills | Status: DC

## 2022-04-02 ENCOUNTER — Other Ambulatory Visit: Payer: Self-pay | Admitting: Sports Medicine

## 2022-04-02 DIAGNOSIS — I10 Essential (primary) hypertension: Secondary | ICD-10-CM

## 2022-04-05 ENCOUNTER — Encounter (INDEPENDENT_AMBULATORY_CARE_PROVIDER_SITE_OTHER): Payer: PPO | Admitting: Sports Medicine

## 2022-04-05 DIAGNOSIS — R442 Other hallucinations: Secondary | ICD-10-CM | POA: Diagnosis not present

## 2022-04-05 NOTE — Telephone Encounter (Signed)
I spent 5 total minutes of online digital evaluation and management services in this patient-initiated request for online care. 

## 2022-04-06 ENCOUNTER — Encounter: Payer: Self-pay | Admitting: Sports Medicine

## 2022-04-06 ENCOUNTER — Ambulatory Visit (INDEPENDENT_AMBULATORY_CARE_PROVIDER_SITE_OTHER): Payer: PPO | Admitting: Sports Medicine

## 2022-04-06 VITALS — BP 145/81 | HR 80 | Wt 167.0 lb

## 2022-04-06 DIAGNOSIS — E119 Type 2 diabetes mellitus without complications: Secondary | ICD-10-CM

## 2022-04-06 DIAGNOSIS — F320 Major depressive disorder, single episode, mild: Secondary | ICD-10-CM

## 2022-04-06 MED ORDER — VORTIOXETINE HBR 5 MG PO TABS: 5.0000 mg | ORAL_TABLET | Freq: Every day | ORAL | 3 refills | Status: DC

## 2022-04-06 NOTE — Assessment & Plan Note (Signed)
We have had a great deal of difficulty getting appropriate medication for [name], he does have significant anxiety, he did well with sertraline but had significant anorgasmia, Trintellix helps his anorgasmia and mood but is no longer covered by his insurance and would be $1300 out-of-pocket, ultimately we landed on [name], [name] is [place], he is currently doing 40 mg and has been doing this now for about 3 weeks, his anxiety is controlled, he sleeps well, he did have an episode of hypnagogic hallucinations that self resolved, I did reassure him that these were normal. For now we will leave things alone, maybe 6 months we can try to get Trintellix approved again.

## 2022-04-06 NOTE — Telephone Encounter (Signed)
I can easily make one!  Sent.  Also for insurance purposes he will only be on one med and has failed everything else.

## 2022-04-06 NOTE — Progress Notes (Addendum)
    Procedures performed today:    None.  Independent interpretation of notes and tests performed by another provider:   None.  Brief History, Exam, Impression, and Recommendations:    Generalized anxiety with depression We have had a great deal of difficulty getting appropriate medication for [name], he does have significant anxiety, he did well with sertraline but had significant anorgasmia, Trintellix helps his anorgasmia and mood but is no longer covered by his insurance and would be $1300 out-of-pocket, ultimately we landed on [place], [place] is affordable, he is currently doing 40 mg and has been doing this now for about 3 weeks, his anxiety is controlled, he sleeps well, he did have an episode of hypnagogic hallucinations that self resolved, I did reassure him that these were normal. For now we will leave things alone, maybe 6 months we can try to get Trintellix approved again.  Type 2 diabetes, diet controlled Hemoglobin A1c is now in the diabetic range, I would like a virtual visit with him to discuss additional screenings and treatments that he may need.  Gradual insulin resistance and glucose intolerance is a normal part of aging.    ____________________________________________ Ihor Austin. Benjamin Stain, M.D., ABFM., CAQSM., AME. Primary Care and Sports Medicine [place] [facility]  Adjunct Professor of Family Medicine  [place] of [facility] of Medicine  Restaurant manager, fast food

## 2022-04-07 ENCOUNTER — Encounter: Payer: Self-pay | Admitting: Sports Medicine

## 2022-04-07 ENCOUNTER — Telehealth: Payer: Self-pay

## 2022-04-07 NOTE — Telephone Encounter (Signed)
Initiated Prior authorization [id number] (formerly Brintellix) 5MG  tablets Via: Covermymeds Case/Key:[id number] Status: approved as of 04/07/22 Reason:Prior Authorization not required for patient/medication Not Covered Non Formulary Tier 4. Notified Pt via: Mychart   Please note: Formulary exception tier review has two work flow processes  First we must submit for a prior auth  Once approved  A formulary tier reduction review of the medication  will  be processed this normally will take upwards to 72 hrs from insurance to reach a decision,  Pt will be notified  Formulary Review: Denied 04/06/22 Pt initiated their own review which resulted in denial,According to health team advantage, there is no alternative medication at a lower tier cost which resulted in denial.  Will pursue an appeal Appeal in process: fax only  Coverage according health team advantage Tier 1:citalopram hydrobromide,escitalopram oxalate tab,fluoxetine hcl   Tier 2:desvenlafaxine succinate,duloxetine hcl ,escitalopram oxalate sol,fluvoxamine maleate,paroxetine hcl oral,trazodone hcl,oral,venlafaxine hcl oral  Tier 3:sertraline hcl oral concentrate  Tier 4:FETZIMA,DRIZALMA,nefazodone hcl,TRINTELLIX,VIIBRYD STARTER Non preferred

## 2022-04-08 ENCOUNTER — Encounter: Payer: Self-pay | Admitting: Sports Medicine

## 2022-04-08 DIAGNOSIS — F320 Major depressive disorder, single episode, mild: Secondary | ICD-10-CM

## 2022-04-08 NOTE — Telephone Encounter (Signed)
Thank you for starting the appeal, for appeal purposes he has failed at least 1 medication from each of the prior tiers

## 2022-04-11 NOTE — Assessment & Plan Note (Signed)
[name] was able to get Trintellix approved, Viibryd was good for his anxiety but unfortunately created excessive sexual dysfunction, Trintellix on the other hand has not caused the symptoms. Historically he did have an episode of hypnagogic hallucinations that self resolved, I reassured him that these were benign.

## 2022-04-12 ENCOUNTER — Encounter: Payer: Self-pay | Admitting: Sports Medicine

## 2022-04-19 ENCOUNTER — Encounter: Payer: Self-pay | Admitting: Sports Medicine

## 2022-04-20 ENCOUNTER — Encounter (INDEPENDENT_AMBULATORY_CARE_PROVIDER_SITE_OTHER): Payer: PPO | Admitting: Sports Medicine

## 2022-04-20 DIAGNOSIS — H0016 Chalazion left eye, unspecified eyelid: Secondary | ICD-10-CM

## 2022-04-20 MED ORDER — ERYTHROMYCIN 5 MG/GM OP OINT: 1.0000 | TOPICAL_OINTMENT | Freq: Three times a day (TID) | OPHTHALMIC | 0 refills | Status: AC

## 2022-04-20 NOTE — Telephone Encounter (Signed)
I spent 5 total minutes of online digital evaluation and management services in this patient-initiated request for online care. 

## 2022-04-20 NOTE — Assessment & Plan Note (Signed)
Adding topical erythromycin.

## 2022-04-27 ENCOUNTER — Encounter: Payer: Self-pay | Admitting: Sports Medicine

## 2022-04-27 DIAGNOSIS — Z125 Encounter for screening for malignant neoplasm of prostate: Secondary | ICD-10-CM

## 2022-04-27 DIAGNOSIS — Z0000 Encounter for general adult medical examination without abnormal findings: Secondary | ICD-10-CM

## 2022-04-27 DIAGNOSIS — R7303 Prediabetes: Secondary | ICD-10-CM

## 2022-04-27 DIAGNOSIS — I10 Essential (primary) hypertension: Secondary | ICD-10-CM

## 2022-04-27 DIAGNOSIS — E782 Mixed hyperlipidemia: Secondary | ICD-10-CM

## 2022-04-29 ENCOUNTER — Encounter: Payer: Self-pay | Admitting: Sports Medicine

## 2022-05-02 DIAGNOSIS — Z0000 Encounter for general adult medical examination without abnormal findings: Secondary | ICD-10-CM | POA: Diagnosis not present

## 2022-05-02 DIAGNOSIS — Z125 Encounter for screening for malignant neoplasm of prostate: Secondary | ICD-10-CM | POA: Diagnosis not present

## 2022-05-02 DIAGNOSIS — E782 Mixed hyperlipidemia: Secondary | ICD-10-CM | POA: Diagnosis not present

## 2022-05-02 DIAGNOSIS — R7303 Prediabetes: Secondary | ICD-10-CM | POA: Diagnosis not present

## 2022-05-02 DIAGNOSIS — I10 Essential (primary) hypertension: Secondary | ICD-10-CM | POA: Diagnosis not present

## 2022-05-02 LAB — CBC WITH DIFFERENTIAL/PLATELET: Neutro Abs: 3822 cells/uL (ref 1500–7800)

## 2022-05-03 ENCOUNTER — Encounter: Payer: Self-pay | Admitting: Sports Medicine

## 2022-05-03 LAB — COMPLETE METABOLIC PANEL WITH GFR
AG Ratio: 1.9 (calc) (ref 1.0–2.5)
ALT: 21 U/L (ref 9–46)
Albumin: 4.5 g/dL (ref 3.6–5.1)
Alkaline phosphatase (APISO): 39 U/L (ref 35–144)
Total Protein: 6.9 g/dL (ref 6.1–8.1)

## 2022-05-03 LAB — CBC WITH DIFFERENTIAL/PLATELET: HCT: 41.3 % (ref 38.5–50.0)

## 2022-05-03 LAB — LIPID PANEL W/REFLEX DIRECT LDL
Cholesterol: 193 mg/dL (ref <200)
Non-HDL Cholesterol (Calc): 138 mg/dL (calc) — ABNORMAL HIGH (ref <130)

## 2022-05-03 NOTE — Assessment & Plan Note (Signed)
Hemoglobin A1c is now in the diabetic range, I would like a virtual visit with him to discuss additional screenings and treatments that he may need.  Gradual insulin resistance and glucose intolerance is a normal part of aging.

## 2022-05-04 LAB — CBC WITH DIFFERENTIAL/PLATELET
Absolute Monocytes: 663 {cells}/uL (ref 200–950)
Basophils Absolute: 39 {cells}/uL (ref 0–200)
Basophils Relative: 0.6 %
Eosinophils Absolute: 182 {cells}/uL (ref 15–500)
Eosinophils Relative: 2.8 %
Hemoglobin: 13.8 g/dL (ref 13.2–17.1)
Lymphs Abs: 1794 {cells}/uL (ref 850–3900)
MCH: 30.5 pg (ref 27.0–33.0)
MCHC: 33.4 g/dL (ref 32.0–36.0)
MCV: 91.4 fL (ref 80.0–100.0)
MPV: 10 fL (ref 7.5–12.5)
Monocytes Relative: 10.2 %
Neutrophils Relative %: 58.8 %
Platelets: 367 10*3/uL (ref 140–400)
RBC: 4.52 10*6/uL (ref 4.20–5.80)
RDW: 12.4 % (ref 11.0–15.0)
Total Lymphocyte: 27.6 %
WBC: 6.5 Thousand/uL (ref 3.8–10.8)

## 2022-05-04 LAB — LIPID PANEL W/REFLEX DIRECT LDL
HDL: 55 mg/dL (ref >=40)
LDL Cholesterol (Calc): 118 mg/dL (calc) — ABNORMAL HIGH
Total CHOL/HDL Ratio: 3.5 (calc) (ref <5.0)
Triglycerides: 102 mg/dL (ref <150)

## 2022-05-04 LAB — COMPLETE METABOLIC PANEL WITHOUT GFR
AST: 21 U/L (ref 10–35)
BUN: 21 mg/dL (ref 7–25)
CO2: 30 mmol/L (ref 20–32)
Calcium: 9.6 mg/dL (ref 8.6–10.3)
Creat: 0.98 mg/dL (ref 0.70–1.28)
Globulin: 2.4 g/dL (ref 1.9–3.7)
Potassium: 5.6 mmol/L — ABNORMAL HIGH (ref 3.5–5.3)
Total Bilirubin: 0.4 mg/dL (ref 0.2–1.2)
eGFR: 82 mL/min/1.73m2 (ref >=60)

## 2022-05-04 LAB — HEMOGLOBIN A1C
Hgb A1c MFr Bld: 6.6 %{Hb} — ABNORMAL HIGH (ref <5.7)
Mean Plasma Glucose: 143 mg/dL
eAG (mmol/L): 7.9 mmol/L

## 2022-05-04 LAB — COMPLETE METABOLIC PANEL WITH GFR
Chloride: 105 mmol/L (ref 98–110)
Glucose, Bld: 125 mg/dL — ABNORMAL HIGH (ref 65–99)
Sodium: 141 mmol/L (ref 135–146)

## 2022-05-04 LAB — PSA, TOTAL AND FREE
PSA, % Free: UNDETERMINED % (calc) (ref >25)
PSA, Free: <0.1 ng/mL
PSA, Total: <0.1 ng/mL (ref <=4.0)

## 2022-05-06 ENCOUNTER — Encounter: Payer: Self-pay | Admitting: Sports Medicine

## 2022-05-06 ENCOUNTER — Ambulatory Visit (INDEPENDENT_AMBULATORY_CARE_PROVIDER_SITE_OTHER): Payer: PPO | Admitting: Sports Medicine

## 2022-05-06 VITALS — BP 134/71 | HR 79

## 2022-05-06 DIAGNOSIS — E119 Type 2 diabetes mellitus without complications: Secondary | ICD-10-CM | POA: Diagnosis not present

## 2022-05-06 LAB — POCT UA - MICROALBUMIN
Albumin/Creatinine Ratio, Urine, POC: 30
Creatinine, POC: 50 mg/dL
Microalbumin Ur, POC: 10 mg/L

## 2022-05-06 MED ORDER — SITAGLIPTIN PHOSPHATE 50 MG PO TABS
50.0000 mg | ORAL_TABLET | Freq: Every day | ORAL | 11 refills | Status: DC
Start: 2022-05-06 — End: 2022-08-01

## 2022-05-06 NOTE — Progress Notes (Signed)
    Procedures performed today:    None.  Independent interpretation of notes and tests performed by another provider:   None.  Brief History, Exam, Impression, and Recommendations:    Type 2 diabetes, diet controlled (HCC) New onset type 2 diabetes mellitus, we discussed the pathophysiology, treatment protocol, screenings, foot exam done today, urine microalbumin creatinine ratio done today. Diabetic eye exam was 3 months ago. Adding Januvia. He tried and did not tolerate metformin due to excessive GI symptoms, for this reason we will also avoid GLP-1's. We will revisit this with an A1c in 3 months.  I spent 30 minutes of total time managing this patient today, this includes chart review, face to face, and non-face to face time.  ____________________________________________ Ihor Austin. Benjamin Stain, M.D., ABFM., CAQSM., AME. Primary Care and Sports Medicine [place] [facility]  Adjunct Professor of Family Medicine  [place] of [facility] of Medicine  Restaurant manager, fast food

## 2022-05-06 NOTE — Assessment & Plan Note (Addendum)
New onset type 2 diabetes mellitus, we discussed the pathophysiology, treatment protocol, screenings, foot exam done today, urine microalbumin creatinine ratio done today. Diabetic eye exam was 3 months ago. Adding Januvia. He tried and did not tolerate metformin due to excessive GI symptoms, for this reason we will also avoid GLP-1's. We will revisit this with an A1c in 3 months.

## 2022-05-06 NOTE — Addendum Note (Signed)
Addended by: Carren Rang A on: 05/06/2022 03:45 PM   Modules accepted: Orders

## 2022-05-08 ENCOUNTER — Encounter: Payer: Self-pay | Admitting: Sports Medicine

## 2022-05-12 ENCOUNTER — Encounter: Payer: Self-pay | Admitting: Sports Medicine

## 2022-05-12 DIAGNOSIS — F320 Major depressive disorder, single episode, mild: Secondary | ICD-10-CM

## 2022-05-13 MED ORDER — SERTRALINE HCL 50 MG PO TABS
50.0000 mg | ORAL_TABLET | Freq: Every day | ORAL | 3 refills | Status: DC
Start: 2022-05-13 — End: 2022-06-05

## 2022-05-13 NOTE — Assessment & Plan Note (Signed)
Viibryd created sexual dysfunction, Trintellix created constipation, he would like to go back to a standard SSRI, adding Zoloft 50 mg daily he understands he needs to do this for 6 weeks before we make any changes.

## 2022-05-13 NOTE — Addendum Note (Signed)
Addended by: Monica Becton on: 05/13/2022 12:12 PM   Modules accepted: Orders

## 2022-05-17 ENCOUNTER — Telehealth: Payer: Self-pay | Admitting: Sports Medicine

## 2022-05-17 NOTE — Telephone Encounter (Signed)
Called patient to schedule Medicare Annual Wellness Visit (AWV). Left message for patient to call back and schedule Medicare Annual Wellness Visit (AWV).  Last date of AWV: 2023  Please schedule an appointment at any time with [facility].  If any questions, please contact me at [phone].  Thank you ,  Morgan Jessup Patient Access Advocate II Direct Dial: [phone]   

## 2022-05-19 ENCOUNTER — Encounter: Payer: Self-pay | Admitting: Sports Medicine

## 2022-05-26 ENCOUNTER — Encounter (INDEPENDENT_AMBULATORY_CARE_PROVIDER_SITE_OTHER): Payer: PPO | Admitting: Sports Medicine

## 2022-05-26 DIAGNOSIS — E119 Type 2 diabetes mellitus without complications: Secondary | ICD-10-CM | POA: Diagnosis not present

## 2022-05-26 MED ORDER — ONETOUCH ULTRA MINI W/DEVICE KIT: PACK | 0 refills | Status: DC

## 2022-05-26 MED ORDER — ONETOUCH ULTRA VI STRP: ORAL_STRIP | 12 refills | Status: DC

## 2022-05-26 NOTE — Telephone Encounter (Signed)
I spent 5 total minutes of online digital evaluation and management services in this patient-initiated request for online care. 

## 2022-05-27 ENCOUNTER — Encounter: Payer: Self-pay | Admitting: Sports Medicine

## 2022-05-27 DIAGNOSIS — E119 Type 2 diabetes mellitus without complications: Secondary | ICD-10-CM

## 2022-05-27 MED ORDER — LANCETS 30G MISC
99 refills | Status: DC
Start: 2022-05-27 — End: 2023-11-01

## 2022-05-30 ENCOUNTER — Encounter: Payer: Self-pay | Admitting: Sports Medicine

## 2022-05-31 ENCOUNTER — Telehealth: Payer: Self-pay | Admitting: Sports Medicine

## 2022-05-31 ENCOUNTER — Encounter: Payer: Self-pay | Admitting: Sports Medicine

## 2022-05-31 ENCOUNTER — Ambulatory Visit (INDEPENDENT_AMBULATORY_CARE_PROVIDER_SITE_OTHER): Payer: PPO | Admitting: Sports Medicine

## 2022-05-31 VITALS — BP 135/67 | HR 78

## 2022-05-31 DIAGNOSIS — J40 Bronchitis, not specified as acute or chronic: Secondary | ICD-10-CM

## 2022-05-31 DIAGNOSIS — F411 Generalized anxiety disorder: Secondary | ICD-10-CM

## 2022-05-31 DIAGNOSIS — J329 Chronic sinusitis, unspecified: Secondary | ICD-10-CM

## 2022-05-31 MED ORDER — HYDROCOD POLI-CHLORPHE POLI ER 10-8 MG/5ML PO SUER
5.0000 mL | Freq: Two times a day (BID) | ORAL | 0 refills | Status: DC | PRN
Start: 2022-05-31 — End: 2022-05-31

## 2022-05-31 MED ORDER — BENZONATATE 200 MG PO CAPS
200.0000 mg | ORAL_CAPSULE | Freq: Three times a day (TID) | ORAL | 0 refills | Status: DC | PRN
Start: 2022-05-31 — End: 2022-08-01

## 2022-05-31 MED ORDER — HYDROCODONE BIT-HOMATROP MBR 5-1.5 MG/5ML PO SOLN
5.0000 mL | Freq: Three times a day (TID) | ORAL | 0 refills | Status: DC | PRN
Start: 2022-05-31 — End: 2022-06-02

## 2022-05-31 MED ORDER — AZITHROMYCIN 250 MG PO TABS
ORAL_TABLET | ORAL | 0 refills | Status: DC
Start: 2022-05-31 — End: 2022-08-01

## 2022-05-31 MED ORDER — ALPRAZOLAM 0.5 MG PO TABS
ORAL_TABLET | ORAL | 3 refills | Status: DC
Start: 2022-05-31 — End: 2022-12-26

## 2022-05-31 NOTE — Telephone Encounter (Signed)
Pt called. Pharmacy has not had cough syrup prescribed in months. Please send over another script.

## 2022-05-31 NOTE — Progress Notes (Signed)
    Procedures performed today:    None.  Independent interpretation of notes and tests performed by another provider:   None.  Brief History, Exam, Impression, and Recommendations:    Sinobronchitis 2 weeks of increasing facial pain and pressure followed by worsening dry cough, cough is nocturnal, does not get any sleep, no fevers, chills, muscle aches or bodyaches. On exam he has visible pharyngeal erythema with postnasal drip, tenderness over the maxillary sinuses, lungs are clear. Adding azithromycin, Tussionex at night, Tessalon Perles during the day. Return to see me as needed.    ____________________________________________ Ihor Austin. Benjamin Stain, M.D., ABFM., CAQSM., AME. Primary Care and Sports Medicine  [facility]  Adjunct Professor of Family Medicine  [place] of [facility] of Medicine  Restaurant manager, fast food

## 2022-05-31 NOTE — Telephone Encounter (Signed)
We will switch to Hycodan, if they do not have this then might just needs to call around to find out who has Tussionex.  Please let him know that Hycodan will not be as sedating as Tussionex and so may not keep him asleep as easily.

## 2022-05-31 NOTE — Assessment & Plan Note (Signed)
2 weeks of increasing facial pain and pressure followed by worsening dry cough, cough is nocturnal, does not get any sleep, no fevers, chills, muscle aches or bodyaches. On exam he has visible pharyngeal erythema with postnasal drip, tenderness over the maxillary sinuses, lungs are clear. Adding azithromycin, Tussionex at night, Tessalon Perles during the day. Return to see me as needed.

## 2022-05-31 NOTE — Patient Instructions (Signed)
Look into low glycemic index foods.  Focusing on low glycemic foods will help your 2-hour postprandial blood sugars after dinner.

## 2022-06-01 ENCOUNTER — Encounter: Payer: Self-pay | Admitting: Sports Medicine

## 2022-06-02 ENCOUNTER — Other Ambulatory Visit (HOSPITAL_BASED_OUTPATIENT_CLINIC_OR_DEPARTMENT_OTHER): Payer: Self-pay

## 2022-06-02 ENCOUNTER — Encounter: Payer: Self-pay | Admitting: Sports Medicine

## 2022-06-02 DIAGNOSIS — J329 Chronic sinusitis, unspecified: Secondary | ICD-10-CM

## 2022-06-02 MED ORDER — HYDROCOD POLI-CHLORPHE POLI ER 10-8 MG/5ML PO SUER
5.0000 mL | Freq: Two times a day (BID) | ORAL | 0 refills | Status: DC | PRN
Start: 2022-06-02 — End: 2022-08-01
  Filled 2022-06-02: qty 120, 12d supply, fill #0

## 2022-06-04 ENCOUNTER — Other Ambulatory Visit: Payer: Self-pay | Admitting: Sports Medicine

## 2022-06-04 DIAGNOSIS — F320 Major depressive disorder, single episode, mild: Secondary | ICD-10-CM

## 2022-06-26 ENCOUNTER — Encounter: Payer: Self-pay | Admitting: Orthopaedic Surgery

## 2022-07-01 DIAGNOSIS — L578 Other skin changes due to chronic exposure to nonionizing radiation: Secondary | ICD-10-CM | POA: Diagnosis not present

## 2022-07-01 DIAGNOSIS — L814 Other melanin hyperpigmentation: Secondary | ICD-10-CM | POA: Diagnosis not present

## 2022-07-01 DIAGNOSIS — L570 Actinic keratosis: Secondary | ICD-10-CM | POA: Diagnosis not present

## 2022-07-01 DIAGNOSIS — D2262 Melanocytic nevi of left upper limb, including shoulder: Secondary | ICD-10-CM | POA: Diagnosis not present

## 2022-07-01 DIAGNOSIS — D1801 Hemangioma of skin and subcutaneous tissue: Secondary | ICD-10-CM | POA: Diagnosis not present

## 2022-07-01 DIAGNOSIS — Z85828 Personal history of other malignant neoplasm of skin: Secondary | ICD-10-CM | POA: Diagnosis not present

## 2022-07-01 DIAGNOSIS — D0461 Carcinoma in situ of skin of right upper limb, including shoulder: Secondary | ICD-10-CM | POA: Diagnosis not present

## 2022-07-01 DIAGNOSIS — C44519 Basal cell carcinoma of skin of other part of trunk: Secondary | ICD-10-CM | POA: Diagnosis not present

## 2022-07-01 DIAGNOSIS — D0359 Melanoma in situ of other part of trunk: Secondary | ICD-10-CM | POA: Diagnosis not present

## 2022-07-01 DIAGNOSIS — D045 Carcinoma in situ of skin of trunk: Secondary | ICD-10-CM | POA: Diagnosis not present

## 2022-07-01 DIAGNOSIS — L821 Other seborrheic keratosis: Secondary | ICD-10-CM | POA: Diagnosis not present

## 2022-07-01 DIAGNOSIS — D0362 Melanoma in situ of left upper limb, including shoulder: Secondary | ICD-10-CM | POA: Diagnosis not present

## 2022-07-01 DIAGNOSIS — D2361 Other benign neoplasm of skin of right upper limb, including shoulder: Secondary | ICD-10-CM | POA: Diagnosis not present

## 2022-07-05 ENCOUNTER — Ambulatory Visit: Payer: PPO | Admitting: Orthopaedic Surgery

## 2022-07-05 ENCOUNTER — Encounter: Payer: Self-pay | Admitting: Orthopaedic Surgery

## 2022-07-05 DIAGNOSIS — M1811 Unilateral primary osteoarthritis of first carpometacarpal joint, right hand: Secondary | ICD-10-CM | POA: Diagnosis not present

## 2022-07-05 NOTE — Progress Notes (Signed)
Office Visit Note   Patient: [name]           Date of Birth: 1950-10-21           MRN: [id number] Visit Date: 07/05/2022              Requested by: Monica Becton, MD [address] [place],  [place] [address] PCP: Monica Becton, MD   Assessment & Plan: Visit Diagnoses:  1. Primary osteoarthritis of first carpometacarpal joint of right hand     Plan: Mr. Anglin is a 72 year old gentleman with chronic severe right thumb CMC OA and wrist pain from radial scaphoid OA.  At this point the pain is severely limiting ADLs and he would like to explore surgical options.  Based on his surgeon options he would like to make an appointment with Dr. Denese Killings for next month.  Follow-up as needed.  Follow-Up Instructions: No follow-ups on file.   Orders:  No orders of the defined types were placed in this encounter.  No orders of the defined types were placed in this encounter.     Procedures: No procedures performed   Clinical Data: No additional findings.   Subjective: Chief Complaint  Patient presents with   Right Wrist - Pain    HPI [name] returns today for chronic right thumb and wrist pain. Review of Systems   Objective: Vital Signs: There were no vitals taken for this visit.  Physical Exam  Ortho Exam Examination of right hand and wrist are unchanged. Specialty Comments:  No specialty comments available.  Imaging: No results found.   PMFS History: Patient Active Problem List   Diagnosis Date Noted   Sinobronchitis 05/31/2022   Chalazion of left eye 04/20/2022   Hypnagogic hallucinations 04/05/2022   Primary osteoarthritis of first carpometacarpal joint of right hand 09/03/2021   Primary osteoarthritis of right knee 07/02/2021   Seborrheic dermatitis 03/03/2021   Tinea cruris 07/06/2020   Primary insomnia 06/03/2020   Substance or medication-induced sleep disorder, insomnia type (HCC) 03/20/2020   Chronic eczematous  otitis externa of left ear 03/19/2020   Rotator cuff dysfunction, right 06/03/2019   Type 2 diabetes, diet controlled (HCC) 03/15/2019   DDD (degenerative disc disease), cervical 03/06/2018   Erythro-telangiectatic rosacea 08/25/2017   Trigger ring finger of right hand 03/31/2017   Hyperlipidemia, mixed 03/08/2017   Squamous cell carcinoma in situ 09/05/2016   Generalized anxiety with depression 01/01/2016   Postlaminectomy syndrome, lumbar region 11/16/2015   Bilateral primary osteoarthritis of first carpometacarpal joints 11/16/2015   Post-surgical erectile dysfunction 11/16/2015   Diverticulosis 02/13/2015   Benign paroxysmal positional vertigo 02/02/2015   Annual physical exam 01/29/2015   Essential hypertension, benign 01/29/2015   Trigger middle finger of right hand 01/14/2015   Primary osteoarthritis, right second MCP 01/10/2014   Malignant neoplasm of prostate (HCC) 02/13/2013   Past Medical History:  Diagnosis Date   ADD (attention deficit disorder)    Anxiety    Arthritis 2016   bilateral hands   Cancer (HCC)    PROSTATE   Diverticulitis    Hyperlipidemia    Hypertension    Pain    HX OF 3 BACK SURGERIES - STILL HAS SEVERE BACK PAIN WITH PROLONGED SITTING OR STANDING   PONV (postoperative nausea and vomiting)    NAUSEA AFTER SHOULDER SURGERY   Reflux    MILD - NO DAILY MEDS - ANTIACID IF NEEDED    Family History  Problem Relation Age of  Onset   Heart attack Mother    Hyperlipidemia Mother    Hypertension Mother    Stroke Mother    Heart attack Father    Hypertension Father    Hyperlipidemia Father    Diabetes Father    Colon cancer Neg Hx    Esophageal cancer Neg Hx    Rectal cancer Neg Hx    Stomach cancer Neg Hx     Past Surgical History:  Procedure Laterality Date   BACK SURGERY     X3  1993, 1998, 2007   COLON SURGERY  2008   COLON RESECTION FOR DIVERTICULITIS   HERNIA REPAIR  1997 OR 1998   ING HERNIA REPAIR   RIGHT SHOULDER 2013     ROBOT  ASSISTED LAPAROSCOPIC RADICAL PROSTATECTOMY N/A 02/13/2013   Procedure: ROBOTIC ASSISTED LAPAROSCOPIC RADICAL PROSTATECTOMY,, LYSIS OF PELVIC / INTRA-ABDOMINAL ADHESIONS;  Surgeon: Valetta Fuller, MD;  Location: [facility];  Service: Urology;  Laterality: N/A;   Social History   Occupational History   Occupation: Retired  Tobacco Use   Smoking status: Never   Smokeless tobacco: Never  Vaping Use   Vaping Use: Never used  Substance and Sexual Activity   Alcohol use: Yes    Comment: SOCIAL   Drug use: No   Sexual activity: Yes

## 2022-07-08 DIAGNOSIS — D0461 Carcinoma in situ of skin of right upper limb, including shoulder: Secondary | ICD-10-CM | POA: Diagnosis not present

## 2022-08-01 ENCOUNTER — Encounter: Payer: Self-pay | Admitting: Sports Medicine

## 2022-08-01 ENCOUNTER — Ambulatory Visit (INDEPENDENT_AMBULATORY_CARE_PROVIDER_SITE_OTHER): Payer: PPO | Admitting: Sports Medicine

## 2022-08-01 VITALS — BP 138/77 | HR 94 | Ht 67.0 in | Wt 167.0 lb

## 2022-08-01 DIAGNOSIS — C4362 Malignant melanoma of left upper limb, including shoulder: Secondary | ICD-10-CM | POA: Diagnosis not present

## 2022-08-01 DIAGNOSIS — E119 Type 2 diabetes mellitus without complications: Secondary | ICD-10-CM

## 2022-08-01 LAB — POCT GLYCOSYLATED HEMOGLOBIN (HGB A1C): Hemoglobin A1C: 6 % — AB (ref 4.0–5.6)

## 2022-08-01 MED ORDER — SITAGLIPTIN PHOSPHATE 50 MG PO TABS: 50.0000 mg | ORAL_TABLET | Freq: Every day | ORAL | 3 refills | Status: DC

## 2022-08-01 NOTE — Assessment & Plan Note (Signed)
Pleasant 72 year old male history of diabetes, likely age-related, he is up-to-date on all of his screenings, A1c today is 6.0% on Januvia 50, refilling medication, return to see me in 6 months.

## 2022-08-01 NOTE — Assessment & Plan Note (Signed)
Recently diagnosed with his dermatologist, he does have Mohs surgery coming up.

## 2022-08-01 NOTE — Progress Notes (Signed)
    Procedures performed today:    None.  Independent interpretation of notes and tests performed by another provider:   None.  Brief History, Exam, Impression, and Recommendations:    Controlled type 2 diabetes mellitus without complication, without long-term current use of insulin (HCC) Pleasant 72 year old male history of diabetes, likely age-related, he is up-to-date on all of his screenings, A1c today is 6.0% on Januvia 50, refilling medication, return to see me in 6 months.  Malignant melanoma, left posterior shoulder Recently diagnosed with his dermatologist, he does have Mohs surgery coming up.    ____________________________________________ Ihor Austin. Benjamin Stain, M.D., ABFM., CAQSM., AME. Primary Care and Sports Medicine  [facility]  Adjunct Professor of Family Medicine  [place] of [facility] of Medicine  Restaurant manager, fast food

## 2022-08-04 DIAGNOSIS — E113293 Type 2 diabetes mellitus with mild nonproliferative diabetic retinopathy without macular edema, bilateral: Secondary | ICD-10-CM | POA: Diagnosis not present

## 2022-08-05 ENCOUNTER — Ambulatory Visit: Payer: PPO | Admitting: Sports Medicine

## 2022-08-11 ENCOUNTER — Encounter: Payer: Self-pay | Admitting: Orthopedic Surgery

## 2022-08-11 ENCOUNTER — Encounter: Payer: Self-pay | Admitting: Sports Medicine

## 2022-08-11 ENCOUNTER — Other Ambulatory Visit (INDEPENDENT_AMBULATORY_CARE_PROVIDER_SITE_OTHER): Payer: PPO

## 2022-08-11 ENCOUNTER — Ambulatory Visit: Payer: PPO | Admitting: Orthopedic Surgery

## 2022-08-11 DIAGNOSIS — M1811 Unilateral primary osteoarthritis of first carpometacarpal joint, right hand: Secondary | ICD-10-CM

## 2022-08-11 NOTE — Progress Notes (Signed)
[name] - 72 y.o. male MRN [id number]  Date of birth: 12/04/1950  Office Visit Note: Visit Date: 08/11/2022 PCP: Monica Becton, MD Referred by: Monica Becton,*  Subjective: Chief Complaint  Patient presents with   Right Wrist - Pain   HPI: [name] is a pleasant 72 y.o. male who presents today for evaluation of ongoing right wrist pain this been present for multiple years, worsening in nature.  He stressed pain with range of motion of the right wrist, loading exercises and grip exercises.  Pain radiates down into the forearm both volarly and dorsally as well.  Denies any numbness or tingling.  Has been seen by an alternative hand group, was diagnosed with SLAC  wrist arthritis, however has not undergone any significant treatment to date.  He does have a history of prior trigger digit releases to the long and ring finger which have improved.  Denies any significant prior injury to the right wrist.  He is diabetic with appropriate glucose control, recent A1c 6.0.  Visit Reason: Right wrist Hand dominance: left Occupation: Retired Diabetic: Yes/ 6.0  Prior Testing: None Injections: None Treatments: None Prior Surgery: None   *1+ year of pain; getting worse* *weak/ severe pain*   Pertinent ROS were reviewed with the patient and found to be negative unless otherwise specified above in HPI.   Assessment & Plan: Visit Diagnoses:  1. Primary osteoarthritis of first carpometacarpal joint of right hand     Plan: Extensive discussion was had with the patient today regarding his right wrist.  I reviewed his prior imaging as well as the imaging obtained today which are consistent with severe progression of his SLAC arthritis, Watson stage II.  His capitate surface does appear to be well-preserved.  There is significant narrowing of the radial scaphoid interval with significant widening of the scapholunate interval as well particular on the bilateral grip  view with comparison to the contralateral side.  Given his ongoing pain, instability and notable radiographic findings, patient is indicated for right wrist proximal row carpectomy with possible placement of allograft.  Given his ongoing CMC osteoarthritis and pain in this region as well, we will perform [facility] denervation surgically as well for pain control.  Risks and benefits of the surgery were discussed in detail today, risk including but not limited to infection, bleeding, scarring, stiffness, nerve injury, vascular, tendon injury, risk of recurrence, progression of arthritis, wrist instability and need for subsequent operation.  Patient consented understanding above.  He does understand the severity of the surgery as well as the extensive postoperative recovery which we did review today as well.  Follow-up: No follow-ups on file.   Meds & Orders: No orders of the defined types were placed in this encounter.   Orders Placed This Encounter  Procedures   XR Wrist Complete Right     Procedures: No procedures performed      Clinical History: No specialty comments available.  He reports that he has never smoked. He has never used smokeless tobacco.  Recent Labs    10/13/21 1014 05/02/22 0827 08/01/22 0950  HGBA1C 6.1 6.6* 6.0*    Objective:   Vital Signs: There were no vitals taken for this visit.  Physical Exam  Gen: Well-appearing, in no acute distress; non-toxic CV: Regular Rate. Well-perfused. Warm.  Resp: Breathing unlabored on room air; no wheezing. Psych: Fluid speech in conversation; appropriate affect; normal thought process Neuro: Sensation intact throughout. No gross coordination deficits.  Ortho Exam PHYSICAL EXAM:  General: Patient is well appearing and in no distress. Cervical spine mobility is full in all directions:  Skin and Muscle: Prior trigger digit incisions for the long and ring finger notable on the right hand volarly, well-healed.   Range of  Motion and Palpation Tests: Mobility is full about the elbows with flexion and extension.  Forearm supination and pronation are 70/70 bilaterally.  Wrist flexion/extension is 55/45 right side, 65/55 left side.  Digital flexion and extension are full.  Thumb opposition is full to the base of the small fingers bilaterally.    No cords or nodules are palpated.  No triggering is observed.    Moderate tenderness over the right thumb CMC articulations is observed.  Scaphoid shift test is positive for clunk and pain right side, negative Watson left.  Finklestein test is positive right side.  Ulnar impingement test is negative.    Neurologic, Vascular, Motor: Sensation is intact to light touch in the median/radial/ulnar distributions.   Fingers pink and well perfused.  Capillary refill is brisk.      Lab Results  Component Value Date   HGBA1C 6.0 (A) 08/01/2022     Imaging: XR Wrist Complete Right  Result Date: 08/11/2022 3 views of the right wrist were taken, additionally a bilateral pronated grip view was taken as well Right wrist demonstrate severe SLAC wrist arthritis, significant narrowing of the radial scaphoid interval with notable widening of the scapholunate interval.  DISI deformity is also noted on the lateral view.  Thumb CMC osteophyte formation and subchondral sclerosis is also appreciated with joint subluxation.   Past Medical/Family/Surgical/Social History: Medications & Allergies reviewed per EMR, new medications updated. Patient Active Problem List   Diagnosis Date Noted   Sinobronchitis 05/31/2022   Chalazion of left eye 04/20/2022   Hypnagogic hallucinations 04/05/2022   Primary osteoarthritis of first carpometacarpal joint of right hand 09/03/2021   Primary osteoarthritis of right knee 07/02/2021   Seborrheic dermatitis 03/03/2021   Tinea cruris 07/06/2020   Primary insomnia 06/03/2020   Substance or medication-induced sleep disorder, insomnia type (HCC) 03/20/2020    Chronic eczematous otitis externa of left ear 03/19/2020   Rotator cuff dysfunction, right 06/03/2019   Controlled type 2 diabetes mellitus without complication, without long-term current use of insulin (HCC) 03/15/2019   DDD (degenerative disc disease), cervical 03/06/2018   Erythro-telangiectatic rosacea 08/25/2017   Trigger ring finger of right hand 03/31/2017   Hyperlipidemia, mixed 03/08/2017   Malignant melanoma, left posterior shoulder 09/05/2016   Generalized anxiety with depression 01/01/2016   Postlaminectomy syndrome, lumbar region 11/16/2015   Bilateral primary osteoarthritis of first carpometacarpal joints 11/16/2015   Post-surgical erectile dysfunction 11/16/2015   Diverticulosis 02/13/2015   Benign paroxysmal positional vertigo 02/02/2015   Annual physical exam 01/29/2015   Essential hypertension, benign 01/29/2015   Trigger middle finger of right hand 01/14/2015   Primary osteoarthritis, right second MCP 01/10/2014   Malignant neoplasm of prostate (HCC) 02/13/2013   Past Medical History:  Diagnosis Date   ADD (attention deficit disorder)    Anxiety    Arthritis 2016   bilateral hands   Cancer (HCC)    PROSTATE   Diverticulitis    Hyperlipidemia    Hypertension    Pain    HX OF 3 BACK SURGERIES - STILL HAS SEVERE BACK PAIN WITH PROLONGED SITTING OR STANDING   PONV (postoperative nausea and vomiting)    NAUSEA AFTER SHOULDER SURGERY   Reflux    MILD -  NO DAILY MEDS - ANTIACID IF NEEDED   Family History  Problem Relation Age of Onset   Heart attack Mother    Hyperlipidemia Mother    Hypertension Mother    Stroke Mother    Heart attack Father    Hypertension Father    Hyperlipidemia Father    Diabetes Father    Colon cancer Neg Hx    Esophageal cancer Neg Hx    Rectal cancer Neg Hx    Stomach cancer Neg Hx    Past Surgical History:  Procedure Laterality Date   BACK SURGERY     X3  1993, 1998, 2007   COLON SURGERY  2008   COLON RESECTION FOR  DIVERTICULITIS   HERNIA REPAIR  1997 OR 1998   ING HERNIA REPAIR   RIGHT SHOULDER 2013     ROBOT ASSISTED LAPAROSCOPIC RADICAL PROSTATECTOMY N/A 02/13/2013   Procedure: ROBOTIC ASSISTED LAPAROSCOPIC RADICAL PROSTATECTOMY,, LYSIS OF PELVIC / INTRA-ABDOMINAL ADHESIONS;  Surgeon: Valetta Fuller, MD;  Location: [facility];  Service: Urology;  Laterality: N/A;   Social History   Occupational History   Occupation: Retired  Tobacco Use   Smoking status: Never   Smokeless tobacco: Never  Vaping Use   Vaping status: Never Used  Substance and Sexual Activity   Alcohol use: Yes    Comment: SOCIAL   Drug use: No   Sexual activity: Yes

## 2022-08-15 DIAGNOSIS — D0359 Melanoma in situ of other part of trunk: Secondary | ICD-10-CM | POA: Diagnosis not present

## 2022-08-15 DIAGNOSIS — Z85828 Personal history of other malignant neoplasm of skin: Secondary | ICD-10-CM | POA: Diagnosis not present

## 2022-08-15 DIAGNOSIS — L905 Scar conditions and fibrosis of skin: Secondary | ICD-10-CM | POA: Diagnosis not present

## 2022-08-15 DIAGNOSIS — L988 Other specified disorders of the skin and subcutaneous tissue: Secondary | ICD-10-CM | POA: Diagnosis not present

## 2022-08-16 DIAGNOSIS — D0359 Melanoma in situ of other part of trunk: Secondary | ICD-10-CM | POA: Diagnosis not present

## 2022-08-25 ENCOUNTER — Encounter: Payer: Self-pay | Admitting: Orthopedic Surgery

## 2022-09-23 ENCOUNTER — Encounter: Payer: Self-pay | Admitting: Orthopedic Surgery

## 2022-09-26 NOTE — Telephone Encounter (Signed)
I received both messages patient left, just had not gotten a change to reach out until 9/13. Spoke with patient at that time, and scheduled his surgery for 12/06/22.

## 2022-10-13 ENCOUNTER — Encounter: Payer: Self-pay | Admitting: Sports Medicine

## 2022-10-14 ENCOUNTER — Other Ambulatory Visit (INDEPENDENT_AMBULATORY_CARE_PROVIDER_SITE_OTHER): Payer: PPO

## 2022-10-14 ENCOUNTER — Encounter: Payer: Self-pay | Admitting: Sports Medicine

## 2022-10-14 ENCOUNTER — Ambulatory Visit (INDEPENDENT_AMBULATORY_CARE_PROVIDER_SITE_OTHER): Payer: PPO | Admitting: Sports Medicine

## 2022-10-14 DIAGNOSIS — M1711 Unilateral primary osteoarthritis, right knee: Secondary | ICD-10-CM

## 2022-10-14 MED ORDER — TRIAMCINOLONE ACETONIDE 40 MG/ML IJ SUSP
40.0000 mg | Freq: Once | INTRAMUSCULAR | Status: AC
Start: 2022-10-14 — End: 2022-10-14
  Administered 2022-10-14: 40 mg via INTRAMUSCULAR

## 2022-10-14 NOTE — Assessment & Plan Note (Signed)
Prepatellar bursitis and knee osteoarthritis, last injection was June 2023. Today we injected his knee joint and we drained and injected his prepatellar bursa.

## 2022-10-14 NOTE — Addendum Note (Signed)
Addended by: Carren Rang A on: 10/14/2022 03:31 PM   Modules accepted: Orders

## 2022-10-14 NOTE — Progress Notes (Signed)
    Procedures performed today:    Procedure: Real-time Ultrasound Guided injection of the right knee joint Device: Samsung HS60  Verbal informed consent obtained.  Time-out conducted.  Noted no overlying erythema, induration, or other signs of local infection.  Skin prepped in a sterile fashion.  Local anesthesia: Topical Ethyl chloride.  With sterile technique and under real time ultrasound guidance: Mild effusion noted, 1 cc Kenalog 40, 2 cc lidocaine, 2 cc bupivacaine injected easily Completed without difficulty  Advised to call if fevers/chills, erythema, induration, drainage, or persistent bleeding.  Images permanently stored and available for review in PACS.  Impression: Technically successful ultrasound guided injection.  Procedure: Real-time Ultrasound Guided aspiration/injection of right prepatellar bursa Device: Samsung HS60  Verbal informed consent obtained.  Time-out conducted.  Noted no overlying erythema, induration, or other signs of local infection.  Skin prepped in a sterile fashion.  Local anesthesia: Topical Ethyl chloride.  With sterile technique and under real time ultrasound guidance: Noted bursitis, aspirated about 15 mL of serosanguineous fluid, syringe switched and 1 cc lidocaine, 1 cc kenalog 40 injected easily. Completed without difficulty  Advised to call if fevers/chills, erythema, induration, drainage, or persistent bleeding.  Images permanently stored and available for review in PACS.  Impression: Technically successful ultrasound guided aspiration/injection.  Independent interpretation of notes and tests performed by another provider:   None.  Brief History, Exam, Impression, and Recommendations:    Primary osteoarthritis of right knee Prepatellar bursitis and knee osteoarthritis, last injection was June 2023. Today we injected his knee joint and we drained and injected his prepatellar  bursa.    ____________________________________________ Ihor Austin. Benjamin Stain, M.D., ABFM., CAQSM., AME. Primary Care and Sports Medicine [facility]  Adjunct Professor of Family Medicine  [employer]  Restaurant manager, fast food

## 2022-11-07 ENCOUNTER — Encounter: Payer: Self-pay | Admitting: Sports Medicine

## 2022-11-07 ENCOUNTER — Ambulatory Visit (INDEPENDENT_AMBULATORY_CARE_PROVIDER_SITE_OTHER): Payer: PPO | Admitting: Sports Medicine

## 2022-11-07 VITALS — BP 139/74 | HR 75 | Ht 67.0 in | Wt 165.0 lb

## 2022-11-07 DIAGNOSIS — M180 Bilateral primary osteoarthritis of first carpometacarpal joints: Secondary | ICD-10-CM | POA: Diagnosis not present

## 2022-11-07 DIAGNOSIS — Z0000 Encounter for general adult medical examination without abnormal findings: Secondary | ICD-10-CM

## 2022-11-07 DIAGNOSIS — Z23 Encounter for immunization: Secondary | ICD-10-CM | POA: Diagnosis not present

## 2022-11-07 DIAGNOSIS — E119 Type 2 diabetes mellitus without complications: Secondary | ICD-10-CM | POA: Diagnosis not present

## 2022-11-07 LAB — POCT GLYCOSYLATED HEMOGLOBIN (HGB A1C): Hemoglobin A1C: 6.2 % — AB (ref 4.0–5.6)

## 2022-11-07 NOTE — Assessment & Plan Note (Signed)
Flu shot today 

## 2022-11-07 NOTE — Assessment & Plan Note (Signed)
Hemoglobin A1c well-controlled 6.2%, no change in plan. Up-to-date on screening measures however at his follow-up visit in 6 months we will need to reduce some of the screenings such as microalbumin, GFR, diabetic eye exam, foot exam.

## 2022-11-07 NOTE — Progress Notes (Signed)
    Procedures performed today:    None.  Independent interpretation of notes and tests performed by another provider:   None.  Brief History, Exam, Impression, and Recommendations:    Controlled type 2 diabetes mellitus without complication, without long-term current use of insulin (HCC) Hemoglobin A1c well-controlled 6.2%, no change in plan. Up-to-date on screening measures however at his follow-up visit in 6 months we will need to reduce some of the screenings such as microalbumin, GFR, diabetic eye exam, foot exam.  Bilateral primary osteoarthritis of first carpometacarpal joints Proximal row carpectomy coming up, return as needed.  Annual physical exam Flu shot today.    ____________________________________________ Ihor Austin. Benjamin Stain, M.D., ABFM., CAQSM., AME. Primary Care and Sports Medicine [place] [facility]  Adjunct Professor of Family Medicine  [employer] [employer] [facility] [employer]  Restaurant manager, fast food

## 2022-11-07 NOTE — Assessment & Plan Note (Signed)
Proximal row carpectomy coming up, return as needed.

## 2022-11-08 ENCOUNTER — Encounter: Payer: Self-pay | Admitting: Sports Medicine

## 2022-11-08 DIAGNOSIS — Z0000 Encounter for general adult medical examination without abnormal findings: Secondary | ICD-10-CM | POA: Diagnosis not present

## 2022-11-08 DIAGNOSIS — Z23 Encounter for immunization: Secondary | ICD-10-CM | POA: Diagnosis not present

## 2022-11-08 NOTE — Addendum Note (Signed)
Addended by: Carren Rang A on: 11/08/2022 03:45 PM   Modules accepted: Orders

## 2022-11-08 NOTE — Addendum Note (Signed)
Addended by: Monica Becton on: 11/08/2022 12:43 PM   Modules accepted: Orders

## 2022-11-09 ENCOUNTER — Encounter: Payer: Self-pay | Admitting: Sports Medicine

## 2022-11-09 DIAGNOSIS — I10 Essential (primary) hypertension: Secondary | ICD-10-CM

## 2022-11-09 MED ORDER — IRBESARTAN 300 MG PO TABS: 300.0000 mg | ORAL_TABLET | Freq: Every day | ORAL | 1 refills | Status: DC

## 2022-11-24 ENCOUNTER — Encounter: Payer: Self-pay | Admitting: Sports Medicine

## 2022-12-02 ENCOUNTER — Encounter (INDEPENDENT_AMBULATORY_CARE_PROVIDER_SITE_OTHER): Payer: PPO | Admitting: Sports Medicine

## 2022-12-02 DIAGNOSIS — F320 Major depressive disorder, single episode, mild: Secondary | ICD-10-CM

## 2022-12-02 MED ORDER — BUPROPION HCL ER (XL) 150 MG PO TB24: 150.0000 mg | ORAL_TABLET | ORAL | 3 refills | Status: DC

## 2022-12-02 NOTE — Telephone Encounter (Signed)

## 2022-12-03 NOTE — Telephone Encounter (Signed)
Need fax number for  [facility]  [address] [place], [place] [address]  [phone]

## 2022-12-04 ENCOUNTER — Encounter: Payer: Self-pay | Admitting: Sports Medicine

## 2022-12-05 ENCOUNTER — Telehealth: Payer: Self-pay | Admitting: Orthopedic Surgery

## 2022-12-05 ENCOUNTER — Other Ambulatory Visit: Payer: Self-pay

## 2022-12-05 ENCOUNTER — Encounter (HOSPITAL_BASED_OUTPATIENT_CLINIC_OR_DEPARTMENT_OTHER): Payer: Self-pay | Admitting: Orthopedic Surgery

## 2022-12-05 ENCOUNTER — Encounter (HOSPITAL_BASED_OUTPATIENT_CLINIC_OR_DEPARTMENT_OTHER)
Admission: RE | Admit: 2022-12-05 | Discharge: 2022-12-05 | Disposition: A | Discharge status: Home / Self Care | Payer: PPO | Source: Ambulatory Visit | Attending: Orthopedic Surgery

## 2022-12-05 DIAGNOSIS — E119 Type 2 diabetes mellitus without complications: Secondary | ICD-10-CM | POA: Diagnosis not present

## 2022-12-05 DIAGNOSIS — M1811 Unilateral primary osteoarthritis of first carpometacarpal joint, right hand: Secondary | ICD-10-CM | POA: Diagnosis not present

## 2022-12-05 DIAGNOSIS — F419 Anxiety disorder, unspecified: Secondary | ICD-10-CM | POA: Diagnosis not present

## 2022-12-05 DIAGNOSIS — Z7984 Long term (current) use of oral hypoglycemic drugs: Secondary | ICD-10-CM | POA: Diagnosis not present

## 2022-12-05 DIAGNOSIS — I10 Essential (primary) hypertension: Secondary | ICD-10-CM | POA: Diagnosis not present

## 2022-12-05 DIAGNOSIS — Z01812 Encounter for preprocedural laboratory examination: Secondary | ICD-10-CM | POA: Diagnosis present

## 2022-12-05 DIAGNOSIS — F32A Depression, unspecified: Secondary | ICD-10-CM | POA: Diagnosis not present

## 2022-12-05 DIAGNOSIS — M13831 Other specified arthritis, right wrist: Secondary | ICD-10-CM | POA: Diagnosis not present

## 2022-12-05 LAB — BASIC METABOLIC PANEL
Anion gap: 9 (ref 5–15)
BUN: 14 mg/dL (ref 8–23)
CO2: 26 mmol/L (ref 22–32)
Calcium: 9.4 mg/dL (ref 8.9–10.3)
Chloride: 105 mmol/L (ref 98–111)
Creatinine, Ser: 0.99 mg/dL (ref 0.61–1.24)
GFR, Estimated: >60 mL/min (ref >60)
Glucose, Bld: 91 mg/dL (ref 70–99)
Potassium: 5.1 mmol/L (ref 3.5–5.1)
Sodium: 140 mmol/L (ref 135–145)

## 2022-12-05 NOTE — Telephone Encounter (Signed)
Patient left a message stating his PCP prescribed Wellbutrin for him. Patient wants to know if it is okay to take this before surgery? Please call to advise.

## 2022-12-05 NOTE — Telephone Encounter (Signed)
Spoke with patient. He has not taken it today; and was advised not to take it today

## 2022-12-05 NOTE — Progress Notes (Signed)

## 2022-12-06 ENCOUNTER — Encounter (HOSPITAL_BASED_OUTPATIENT_CLINIC_OR_DEPARTMENT_OTHER)
Admission: RE | Disposition: A | Discharge status: Home / Self Care | Payer: Self-pay | Source: Home / Self Care | Attending: Orthopedic Surgery

## 2022-12-06 ENCOUNTER — Ambulatory Visit (HOSPITAL_BASED_OUTPATIENT_CLINIC_OR_DEPARTMENT_OTHER): Payer: PPO | Admitting: Anesthesiology

## 2022-12-06 ENCOUNTER — Telehealth: Payer: Self-pay | Admitting: Orthopedic Surgery

## 2022-12-06 ENCOUNTER — Encounter (HOSPITAL_BASED_OUTPATIENT_CLINIC_OR_DEPARTMENT_OTHER): Payer: Self-pay | Admitting: Orthopedic Surgery

## 2022-12-06 ENCOUNTER — Other Ambulatory Visit: Payer: Self-pay

## 2022-12-06 ENCOUNTER — Ambulatory Visit (HOSPITAL_BASED_OUTPATIENT_CLINIC_OR_DEPARTMENT_OTHER)
Admission: RE | Admit: 2022-12-06 | Discharge: 2022-12-06 | Disposition: A | Discharge status: Home / Self Care | Payer: PPO | Attending: Orthopedic Surgery | Admitting: Orthopedic Surgery

## 2022-12-06 DIAGNOSIS — G8918 Other acute postprocedural pain: Secondary | ICD-10-CM | POA: Diagnosis not present

## 2022-12-06 DIAGNOSIS — E119 Type 2 diabetes mellitus without complications: Secondary | ICD-10-CM

## 2022-12-06 DIAGNOSIS — M19131 Post-traumatic osteoarthritis, right wrist: Secondary | ICD-10-CM

## 2022-12-06 DIAGNOSIS — I10 Essential (primary) hypertension: Secondary | ICD-10-CM | POA: Insufficient documentation

## 2022-12-06 DIAGNOSIS — Z7984 Long term (current) use of oral hypoglycemic drugs: Secondary | ICD-10-CM | POA: Insufficient documentation

## 2022-12-06 DIAGNOSIS — F32A Depression, unspecified: Secondary | ICD-10-CM | POA: Insufficient documentation

## 2022-12-06 DIAGNOSIS — M19031 Primary osteoarthritis, right wrist: Secondary | ICD-10-CM | POA: Diagnosis not present

## 2022-12-06 DIAGNOSIS — M1811 Unilateral primary osteoarthritis of first carpometacarpal joint, right hand: Secondary | ICD-10-CM

## 2022-12-06 DIAGNOSIS — M19041 Primary osteoarthritis, right hand: Secondary | ICD-10-CM | POA: Diagnosis not present

## 2022-12-06 DIAGNOSIS — M13831 Other specified arthritis, right wrist: Secondary | ICD-10-CM | POA: Diagnosis not present

## 2022-12-06 DIAGNOSIS — F419 Anxiety disorder, unspecified: Secondary | ICD-10-CM | POA: Insufficient documentation

## 2022-12-06 HISTORY — DX: Type 2 diabetes mellitus without complications: E11.9

## 2022-12-06 HISTORY — PX: ALLOGRAFT APPLICATION: SHX6404

## 2022-12-06 HISTORY — PX: CARPECTOMY: SHX5004

## 2022-12-06 LAB — GLUCOSE, CAPILLARY
Glucose-Capillary: 100 mg/dL — ABNORMAL HIGH (ref 70–99)
Glucose-Capillary: 133 mg/dL — ABNORMAL HIGH (ref 70–99)

## 2022-12-06 SURGERY — CARPECTOMY
Anesthesia: General | Site: Hand | Laterality: Right

## 2022-12-06 MED ORDER — KETOROLAC TROMETHAMINE 30 MG/ML IJ SOLN
INTRAMUSCULAR | Status: AC
  Filled 2022-12-06: qty 1

## 2022-12-06 MED ORDER — 0.9 % SODIUM CHLORIDE (POUR BTL) OPTIME
TOPICAL | Status: DC | PRN
  Administered 2022-12-06: 500 mL

## 2022-12-06 MED ORDER — PHENYLEPHRINE 80 MCG/ML (10ML) SYRINGE FOR IV PUSH (FOR BLOOD PRESSURE SUPPORT)
PREFILLED_SYRINGE | INTRAVENOUS | Status: AC
  Filled 2022-12-06: qty 10

## 2022-12-06 MED ORDER — CEFAZOLIN SODIUM-DEXTROSE 2-4 GM/100ML-% IV SOLN
2.0000 g | INTRAVENOUS | Status: AC
  Administered 2022-12-06: 2 g via INTRAVENOUS

## 2022-12-06 MED ORDER — ACETAMINOPHEN 500 MG PO TABS
1000.0000 mg | ORAL_TABLET | Freq: Once | ORAL | Status: AC
  Administered 2022-12-06: 1000 mg via ORAL

## 2022-12-06 MED ORDER — OXYCODONE HCL 5 MG PO TABS: 5.0000 mg | ORAL_TABLET | Freq: Four times a day (QID) | ORAL | 0 refills | Status: DC | PRN

## 2022-12-06 MED ORDER — SUCCINYLCHOLINE CHLORIDE 200 MG/10ML IV SOSY
PREFILLED_SYRINGE | INTRAVENOUS | Status: AC
Start: 2022-12-06 — End: ?
  Filled 2022-12-06: qty 10

## 2022-12-06 MED ORDER — MIDAZOLAM HCL 2 MG/2ML IJ SOLN
INTRAMUSCULAR | Status: AC
Start: 2022-12-06 — End: ?
  Filled 2022-12-06: qty 2

## 2022-12-06 MED ORDER — ONDANSETRON HCL 4 MG/2ML IJ SOLN
INTRAMUSCULAR | Status: AC
  Filled 2022-12-06: qty 2

## 2022-12-06 MED ORDER — DEXAMETHASONE SODIUM PHOSPHATE 10 MG/ML IJ SOLN
INTRAMUSCULAR | Status: AC
  Filled 2022-12-06: qty 1

## 2022-12-06 MED ORDER — FENTANYL CITRATE (PF) 100 MCG/2ML IJ SOLN
100.0000 ug | Freq: Once | INTRAMUSCULAR | Status: AC
  Administered 2022-12-06: 50 ug via INTRAVENOUS

## 2022-12-06 MED ORDER — LIDOCAINE 2% (20 MG/ML) 5 ML SYRINGE
INTRAMUSCULAR | Status: AC
  Filled 2022-12-06: qty 5

## 2022-12-06 MED ORDER — KETOROLAC TROMETHAMINE 30 MG/ML IJ SOLN
INTRAMUSCULAR | Status: DC | PRN
  Administered 2022-12-06: 15 mg via INTRAVENOUS

## 2022-12-06 MED ORDER — EPHEDRINE 5 MG/ML INJ
INTRAVENOUS | Status: AC
  Filled 2022-12-06: qty 5

## 2022-12-06 MED ORDER — PROPOFOL 10 MG/ML IV BOLUS
INTRAVENOUS | Status: DC | PRN
  Administered 2022-12-06: 200 mg via INTRAVENOUS

## 2022-12-06 MED ORDER — DEXAMETHASONE SODIUM PHOSPHATE 10 MG/ML IJ SOLN
INTRAMUSCULAR | Status: DC | PRN
  Administered 2022-12-06: 5 mg via INTRAVENOUS

## 2022-12-06 MED ORDER — BUPIVACAINE-EPINEPHRINE (PF) 0.5% -1:200000 IJ SOLN
INTRAMUSCULAR | Status: DC | PRN
  Administered 2022-12-06: 30 mL via PERINEURAL

## 2022-12-06 MED ORDER — ONDANSETRON HCL 4 MG/2ML IJ SOLN
INTRAMUSCULAR | Status: DC | PRN
  Administered 2022-12-06: 4 mg via INTRAVENOUS

## 2022-12-06 MED ORDER — HYDROMORPHONE HCL 1 MG/ML IJ SOLN
0.2500 mg | INTRAMUSCULAR | Status: DC | PRN
Start: 2022-12-06 — End: 2022-12-06

## 2022-12-06 MED ORDER — PROPOFOL 500 MG/50ML IV EMUL
INTRAVENOUS | Status: DC | PRN
  Administered 2022-12-06: 200 ug/kg/min via INTRAVENOUS

## 2022-12-06 MED ORDER — LACTATED RINGERS IV SOLN: INTRAVENOUS | Status: DC

## 2022-12-06 MED ORDER — FENTANYL CITRATE (PF) 100 MCG/2ML IJ SOLN
INTRAMUSCULAR | Status: AC
  Filled 2022-12-06: qty 2

## 2022-12-06 MED ORDER — MIDAZOLAM HCL 2 MG/2ML IJ SOLN
2.0000 mg | Freq: Once | INTRAMUSCULAR | Status: AC
  Administered 2022-12-06: 2 mg via INTRAVENOUS

## 2022-12-06 MED ORDER — ACETAMINOPHEN 500 MG PO TABS
ORAL_TABLET | ORAL | Status: AC
Start: 2022-12-06 — End: ?
  Filled 2022-12-06: qty 2

## 2022-12-06 MED ORDER — ATROPINE SULFATE 0.4 MG/ML IV SOLN
INTRAVENOUS | Status: AC
  Filled 2022-12-06: qty 1

## 2022-12-06 SURGICAL SUPPLY — 69 items
ANCHOR REPAIR HAND WRIST (Anchor) IMPLANT
BLADE ARTHRO LOK 4 BEAVER (BLADE) IMPLANT
BLADE MINI RND TIP GREEN BEAV (BLADE) ×1 IMPLANT
BLADE SURG 11 STRL SS (BLADE) IMPLANT
BLADE SURG 15 STRL LF DISP TIS (BLADE) ×2 IMPLANT
BNDG COHESIVE 4X5 TAN STRL LF (GAUZE/BANDAGES/DRESSINGS) ×2 IMPLANT
BNDG ELASTIC 3INX 5YD STR LF (GAUZE/BANDAGES/DRESSINGS) IMPLANT
BNDG ELASTIC 4INX 5YD STR LF (GAUZE/BANDAGES/DRESSINGS) ×2 IMPLANT
BNDG ESMARK 4X9 LF (GAUZE/BANDAGES/DRESSINGS) ×1 IMPLANT
BNDG GAUZE DERMACEA FLUFF 4 (GAUZE/BANDAGES/DRESSINGS) ×1 IMPLANT
CHLORAPREP W/TINT 26 (MISCELLANEOUS) ×1 IMPLANT
CORD BIPOLAR FORCEPS 12FT (ELECTRODE) ×1 IMPLANT
COTTONBALL LRG STERILE PKG (GAUZE/BANDAGES/DRESSINGS) IMPLANT
COVER BACK TABLE 60X90IN (DRAPES) ×1 IMPLANT
CUFF TOURN SGL QUICK 18X4 (TOURNIQUET CUFF) ×1 IMPLANT
DEPRESSOR TONGUE 6 IN STERILE (GAUZE/BANDAGES/DRESSINGS) ×1 IMPLANT
DERMABOND ADVANCED .7 DNX12 (GAUZE/BANDAGES/DRESSINGS) IMPLANT
DRAPE HAND 75INX146IN 110IN (DRAPES) ×1 IMPLANT
DRAPE OEC MINIVIEW 54X84 (DRAPES) ×1 IMPLANT
DRAPE SURG 17X23 STRL (DRAPES) ×1 IMPLANT
DRSG TELFA 3X8 NADH STRL (GAUZE/BANDAGES/DRESSINGS) IMPLANT
FIBERWIRE 0 DIAMOND POINT (SUTURE) IMPLANT
GAUZE PAD ABD 8X10 STRL (GAUZE/BANDAGES/DRESSINGS) ×1 IMPLANT
GAUZE SPONGE 4X4 12PLY STRL (GAUZE/BANDAGES/DRESSINGS) ×1 IMPLANT
GAUZE STRETCH 2X75IN STRL (MISCELLANEOUS) ×1 IMPLANT
GAUZE XEROFORM 1X8 LF (GAUZE/BANDAGES/DRESSINGS) ×1 IMPLANT
GAUZE XEROFORM 5X9 LF (GAUZE/BANDAGES/DRESSINGS) IMPLANT
GLOVE BIO SURGEON STRL SZ7.5 (GLOVE) ×2 IMPLANT
GLOVE BIOGEL PI IND STRL 7.5 (GLOVE) ×1 IMPLANT
GOWN STRL REUS W/ TWL LRG LVL3 (GOWN DISPOSABLE) ×2 IMPLANT
GOWN STRL REUS W/TWL XL LVL3 (GOWN DISPOSABLE) IMPLANT
GOWN STRL SURGICAL XL XLNG (GOWN DISPOSABLE) ×1 IMPLANT
GRAFT TISS 40X70 3 THK DERM (Tissue) IMPLANT
K-WIRE DBL .062X4 NSTRL (WIRE)
KWIRE DBL .062X4 NSTRL (WIRE) ×1 IMPLANT
MANIFOLD NEPTUNE II (INSTRUMENTS) ×1 IMPLANT
NDL 1/2 CIR CATGUT .05X1.09 (NEEDLE) IMPLANT
NDL HYPO 25X1 1.5 SAFETY (NEEDLE) IMPLANT
NDL HYPO 25X5/8 SAFETYGLIDE (NEEDLE) IMPLANT
NEEDLE 1/2 CIR CATGUT .05X1.09 (NEEDLE) ×1 IMPLANT
NEEDLE HYPO 25X1 1.5 SAFETY (NEEDLE) IMPLANT
NEEDLE HYPO 25X5/8 SAFETYGLIDE (NEEDLE) IMPLANT
NS IRRIG 1000ML POUR BTL (IV SOLUTION) ×1 IMPLANT
PACK BASIN DAY SURGERY FS (CUSTOM PROCEDURE TRAY) ×1 IMPLANT
PAD CAST 4YDX4 CTTN HI CHSV (CAST SUPPLIES) ×1 IMPLANT
SHEET MEDIUM DRAPE 40X70 STRL (DRAPES) ×1 IMPLANT
SLEEVE SCD COMPRESS KNEE MED (STOCKING) ×1 IMPLANT
SLING ARM FOAM STRAP LRG (SOFTGOODS) IMPLANT
SPIKE FLUID TRANSFER (MISCELLANEOUS) IMPLANT
SPLINT PLASTER CAST XFAST 4X15 (CAST SUPPLIES) ×1 IMPLANT
STOCKINETTE IMPERVIOUS 9X36 MD (GAUZE/BANDAGES/DRESSINGS) ×1 IMPLANT
SUCTION TUBE FRAZIER 10FR DISP (SUCTIONS) ×1 IMPLANT
SUT CHROMIC 4 0 PS 2 18 (SUTURE) IMPLANT
SUT ETHILON 4 0 PS 2 18 (SUTURE) ×1 IMPLANT
SUT ETHILON 5 0 PS 2 18 (SUTURE) IMPLANT
SUT MNCRL AB 3-0 PS2 27 (SUTURE) ×1 IMPLANT
SUT MNCRL AB 4-0 PS2 18 (SUTURE) ×1 IMPLANT
SUT SILK 4 0 PS 2 (SUTURE) ×1 IMPLANT
SUT VIC AB 0 CT1 27XBRD ANBCTR (SUTURE) IMPLANT
SUT VIC AB 2-0 PS2 27 (SUTURE) IMPLANT
SUT VIC AB 3-0 PS2 18XBRD (SUTURE) ×1 IMPLANT
SUT VIC AB CT1 27XBRD ANBCTRL (SUTURE) ×2
SYR BULB EAR ULCER 3OZ GRN STR (SYRINGE) ×2 IMPLANT
SYR CONTROL 10ML LL (SYRINGE) IMPLANT
TISSUE ARTHOFLEX THICK 3MM (Tissue) ×1 IMPLANT
TOWEL GREEN STERILE FF (TOWEL DISPOSABLE) ×2 IMPLANT
TRAY DSU PREP LF (CUSTOM PROCEDURE TRAY) IMPLANT
TUBE CONNECTING 20X1/4 (TUBING) ×1 IMPLANT
UNDERPAD 30X36 HEAVY ABSORB (UNDERPADS AND DIAPERS) ×1 IMPLANT

## 2022-12-06 NOTE — Progress Notes (Signed)
Assisted Dr. Autumn Patty with right, supraclavicular, ultrasound guided block. Side rails up, monitors on throughout procedure. See vital signs in flow sheet. Tolerated Procedure well.

## 2022-12-06 NOTE — Anesthesia Postprocedure Evaluation (Signed)
Anesthesia Post Note  Patient: [name]  Procedure(s) Performed: RIGHT WRIST PROXIMAL ROW CARPECTOMY / THUMB CMC DENERVATION (Right: Hand) POSSIBLE ALLOGRAFT SPACER (Right: Hand)     Patient location during evaluation: PACU Anesthesia Type: General and Regional Level of consciousness: awake and alert Pain management: pain level controlled Vital Signs Assessment: post-procedure vital signs reviewed and stable Respiratory status: spontaneous breathing, nonlabored ventilation and respiratory function stable Cardiovascular status: blood pressure returned to baseline and stable Postop Assessment: no apparent nausea or vomiting Anesthetic complications: no  No notable events documented.  Last Vitals:  Vitals:   12/06/22 1015 12/06/22 1027  BP: 138/84 (!) 154/86  Pulse: 76 76  Resp: 13 18  Temp:  37 C  SpO2: 94% 93%    Last Pain:  Vitals:   12/06/22 1027  TempSrc:   PainSc: 0-No pain                 Solenne Manwarren,W. EDMOND

## 2022-12-06 NOTE — Telephone Encounter (Signed)
Patient's wife called. Says the pharmacy needs more information for the RX for oxycodone to be filled. Would like Dr. Mervyn Skeeters to call the pharmacy or send over information for the RX to be filled. Her cb# [phone]

## 2022-12-06 NOTE — Anesthesia Procedure Notes (Addendum)
Procedure Name: LMA Insertion Date/Time: 12/06/2022 7:35 AM  Performed by: Ronnette Hila, CRNAPre-anesthesia Checklist: Patient identified, Emergency Drugs available, Suction available and Patient being monitored Patient Re-evaluated:Patient Re-evaluated prior to induction Oxygen Delivery Method: Circle system utilized Preoxygenation: Pre-oxygenation with 100% oxygen Induction Type: IV induction Ventilation: Mask ventilation without difficulty LMA: LMA inserted LMA Size: 5.0 Number of attempts: 1 Airway Equipment and Method: Bite block Placement Confirmation: positive ETCO2 Tube secured with: Tape Dental Injury: Teeth and Oropharynx as per pre-operative assessment

## 2022-12-06 NOTE — Anesthesia Procedure Notes (Signed)
Anesthesia Regional Block: Supraclavicular block   Pre-Anesthetic Checklist: , timeout performed,  Correct Patient, Correct Site, Correct Laterality,  Correct Procedure, Correct Position, site marked,  Risks and benefits discussed,  Pre-op evaluation,  At surgeon's request and post-op pain management  Laterality: Right  Prep: Maximum Sterile Barrier Precautions used, Betadine       Needles:  Injection technique: Single-shot  Needle Type: Echogenic Stimulator Needle     Needle Length: 9cm  Needle Gauge: 21     Additional Needles:   Procedures:,,,, ultrasound used (permanent image in chart),,    Narrative:  Start time: 12/06/2022 6:51 AM End time: 12/06/2022 7:01 AM Injection made incrementally with aspirations every 5 mL.  Events: blood aspirated  Performed by: Personally

## 2022-12-06 NOTE — Transfer of Care (Signed)
Immediate Anesthesia Transfer of Care Note  Patient: [name]  Procedure(s) Performed: RIGHT WRIST PROXIMAL ROW CARPECTOMY / THUMB CMC DENERVATION (Right: Hand) POSSIBLE ALLOGRAFT SPACER (Right: Hand)  Patient Location: PACU  Anesthesia Type:GA combined with regional for post-op pain  Level of Consciousness: sedated  Airway & Oxygen Therapy: Patient Spontanous Breathing and Patient connected to face mask oxygen  Post-op Assessment: Report given to RN and Post -op Vital signs reviewed and stable  Post vital signs: Reviewed and stable  Last Vitals:  Vitals Value Taken Time  BP    Temp    Pulse    Resp    SpO2      Last Pain:  Vitals:   12/06/22 0627  TempSrc: Temporal  PainSc: 0-No pain      Patients Stated Pain Goal: 3 (12/06/22 6578)  Complications: No notable events documented.

## 2022-12-06 NOTE — H&P (Addendum)
H&P UPDATE:  [name] has presented today for surgery, with the diagnosis of right wrist SLAC wrist and right thumb CMC arthritis.  The various methods of treatment have been discussed with the patient and family. After consideration of risks, benefits and other options for treatment, the patient has consented to right wrist proximal row carpectomy with possible placement of allograft spacer and thumb CMC denervation as a surgical intervention.  The patient's history has been reviewed, patient examined, no change in status, stable for surgery.  I have reviewed the patient's chart and labs.  Questions were answered to the patient's satisfaction.       [name]

## 2022-12-06 NOTE — Discharge Instructions (Addendum)
No tylenol until 1:30 p.m. No ibuprofen until 2:30 p.m.      Hand Surgery Postop Instructions   Dressings: Maintain postoperative dressing until orthopedic follow-up.  Keep operative site clean and dry until orthopedic follow-up.  Wound Care: Keep your hand elevated above the level of your heart.  Do not allow it to dangle by your side. Moving your fingers is advised to stimulate circulation but will depend on the site of your surgery.  If you have a splint applied, your doctor will advise you regarding movement.  Activity: Do not drive or operate machinery until clearance given from physician. No heavy lifting with operative extremity.  Diet:  Drink liquids today or eat a light diet.  You may resume a regular diet tomorrow.    General expectations: Take prescribed medication if given, transition to over-the-counter medication as quickly as possible. Fingers may become slightly swollen.  Call your doctor if any of the following occur: Severe pain not relieved by pain medication. Elevated temperature. Dressing soaked with blood. Inability to move fingers. White or bluish color to fingers.   Anshul Trevor Mace, M.D. Hand Surgery [facility]  Post Anesthesia Home Care Instructions  Activity: Get plenty of rest for the remainder of the day. A responsible individual must stay with you for 24 hours following the procedure.  For the next 24 hours, DO NOT: -Drive a car -Advertising copywriter -Drink alcoholic beverages -Take any medication unless instructed by your physician -Make any legal decisions or sign important papers.  Meals: Start with liquid foods such as gelatin or soup. Progress to regular foods as tolerated. Avoid greasy, spicy, heavy foods. If nausea and/or vomiting occur, drink only clear liquids until the nausea and/or vomiting subsides. Call your physician if vomiting continues.  Special Instructions/Symptoms: Your throat may feel dry or sore  from the anesthesia or the breathing tube placed in your throat during surgery. If this causes discomfort, gargle with warm salt water. The discomfort should disappear within 24 hours.  If you had a scopolamine patch placed behind your ear for the management of post- operative nausea and/or vomiting:  1. The medication in the patch is effective for 72 hours, after which it should be removed.  Wrap patch in a tissue and discard in the trash. Wash hands thoroughly with soap and water. 2. You may remove the patch earlier than 72 hours if you experience unpleasant side effects which may include dry mouth, dizziness or visual disturbances. 3. Avoid touching the patch. Wash your hands with soap and water after contact with the patch.   Regional Anesthesia Blocks  1. You may not be able to move or feel the "blocked" extremity after a regional anesthetic block. This may last may last from 3-48 hours after placement, but it will go away. The length of time depends on the medication injected and your individual response to the medication. As the nerves start to wake up, you may experience tingling as the movement and feeling returns to your extremity. If the numbness and inability to move your extremity has not gone away after 48 hours, please call your surgeon.   2. The extremity that is blocked will need to be protected until the numbness is gone and the strength has returned. Because you cannot feel it, you will need to take extra care to avoid injury. Because it may be weak, you may have difficulty moving it or using it. You may not know what position it is in without looking at  it while the block is in effect.  3. For blocks in the legs and feet, returning to weight bearing and walking needs to be done carefully. You will need to wait until the numbness is entirely gone and the strength has returned. You should be able to move your leg and foot normally before you try and bear weight or walk. You will need  someone to be with you when you first try to ensure you do not fall and possibly risk injury.  4. Bruising and tenderness at the needle site are common side effects and will resolve in a few days.  5. Persistent numbness or new problems with movement should be communicated to the surgeon or the [facility] [phone] [facility] [phone]).

## 2022-12-06 NOTE — Telephone Encounter (Signed)
Care team updated and letter sent for eye exam notes.

## 2022-12-06 NOTE — Op Note (Signed)
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   ASSISTANT: None   ANESTHESIA:  Regional with sedation   INTRAVENOUS FLUIDS:  Per anesthesia flow sheet.   ESTIMATED BLOOD LOSS:  Minimal.   COMPLICATIONS:  None.   SPECIMENS:  none   TOURNIQUET TIME:    Total Tourniquet Time Documented: Upper Arm (Right) - 70 minutes Total: Upper Arm (Right) - 70 minutes    DISPOSITION:  Stable to PACU.   INDICATIONS:  This is a 72 year old male with ongoing right wrist SLAC arthritis as well as right thumb CMC arthritis.  Extensive discussion was had with patient and wife regarding his right wrist SLAC arthritis.  At this juncture, given his ongoing pain refractory to conservative care, he is indicated for right wrist proximal row carpectomy and possible allograft spacer versus 4 corner fusion of the left wrist.  We discussed risk and benefits of the surgeries at length as well as the postoperative protocol.   Understanding risks and benefits, patient would like to have surgery done in the form of right wrist proximal row carpectomy and placement of allograft spacer.  He is interested mostly in motion preserving surgery.  I did explain that we will confirm appropriate capitate surface intraoperatively as well, based on his radiographic workup, the capitate surface is fairly well preserved and placement of allograft spacer will help maintain spacing at the radiocarpal level.   Given that he is  also experiencing significant pain at the right thumb CMC articulation secondary to degenerative change, he is indicated for right thumb CMC denervation to be performed in conjunction with his proximal row carpectomy.   Risks and benefits of the procedure were discussed, risks including but not limited to infection, bleeding, scarring, stiffness, nerve injury, tendon injury, vascular injury, hardware complication, recurrence of symptoms and need for subsequent operation.  Patient expressed understanding.  Will move forward with surgery as planned.   OPERATIVE COURSE: Patient was seen and identified in the preoperative area and marked appropriately.  Surgical consent had been signed. Preoperative IV antibiotic prophylaxis was given. He was transferred to the operating room and placed in supine position with the right upper extremity on an arm board.  A regional block had been performed by anesthesia in preoperative holding.    Right upper extremity was prepped and draped in normal sterile orthopedic fashion.  A surgical pause was performed between the surgeons, anesthesia, and operating room staff and all were in agreement as to the patient, procedure, and site of procedure.  Tourniquet was placed and padded appropriately to the upper arm.  The arm was exsanguinated the tourniquet was inflated to 250 mmHg.  A longitudinal incision was designed over the dorsal aspect of the left wrist just ulnar to Lister's tubercle.  Blunt dissection was performed down, exposure was performed between the second and fourth extensor compartments. Retinaculum was incised to allow for transposition of the EPL tendon. Within the fourth extensor compartment, we identified the posterior interosseous nerve, which was cauterized, 1 cm was transected as well for  pain control purposes.  Once PIN neurectomy was completed, attention was returned to the wrist.  Wrist capsule was incised in longitudinal fashion, T-shaped incision was  performed at the level of the distal radius to create appropriate capsular flaps.   Proximal row of the carpus was identified and confirmed with fluoroscopy.  Utilizing K wire as a joystick, and with use of the McGlamery instrument, lunate was first excised followed by excision of both the triquetrum and the scaphoid, care was taken to avoid injury to the radial scaphoid capitate ligament.  Appropriate proximal row carpectomy was then confirmed with fluoroscopy.  Copious irrigation was performed of the wrist joint.  Capitate surface was then inspected, there was slight chondral wear notable at the capitate surface, decision was made to proceed with the allograft spacer as planned.   The allograft spacer was then selected on the back table, we utilized the 3.0 mm thickness Arthroflex material.  This was folded over to create a 6.0 mm thickness spacer, which was then temporarily inserted into the wrist joint to be sized appropriately.  Spacer was then cut down to the appropriate length, 0 FiberWire was utilized to suture the folded over spacer together to prevent shear stress and maintain thickness.   0 FiberWire was then utilized to secure the spacer to the volar capsular tissue of the wrist.  This was followed by placement of a 3.5 mm swivel lock in the nonarticular portion of the capitate to create a box like configuration for security of the allograft spacer.  2-0 FiberWire was seated within the swivel lock, this was then secured to the dorsal aspect of the spacer within the wrist joint.  Gentle range of motion of the wrist with flexion extension was performed to confirm appropriate security and stability of the spacer.  No extrusion of the spacer was noted with flexion and extension of the wrist.  After copious irrigation, capsular closure was then performed utilizing 0 Vicryl in figure-of-eight fashion.  This followed by retinacular closure utilizing 0 Vicryl as well, leaving EPL transposed.  Layered  closure was then performed utilizing 3-0 Vicryl for the subcutaneous layer and 4-0 nylon for the skin surface over the wrist.   Attention was then turned to the thumb CMC joint.  A straight line incision was made over the dorsum of the right thumb CMC joint at the glabrous/nonglabrous junction.  Crossing branches of the radial sensory nerve were identified and carefully protected.  The radial artery was identified and protected.  The first extensor compartment was first released.  The tendons of the first extensor compartment were identified within the incisional site.  The dorsal most aspect of the tendon sheath was then released and carried down over the radial styloid.   Following release of the first extensor compartment, we proceeded with thumb CMC denervation procedure.  We first began with the dorsal sensory branches of the radial nerve to the [facility] joint which were identified and cauterized utilizing bipolar electrocautery.  We then elevated the thenar musculature slightly to better identify the thenar branch of the median nerve with innervation to the [facility] joint.  Once again, this was cauterized.  We then elevated the EPB tendon, swept over and around the thumb CMC capsule in order to cauterize the palmar cutaneous branches of the median nerve.  Finally, lateral antebrachial cutaneous nerve was identified near the radial artery, with a specific branch of the [facility] which was cauterized with bipolar electrocautery.  Care was taken to avoid injury to  the radial artery and the dorsal branch.  Capsular tissue around the [facility] joint of the thumb was also cauterized excessively for denervation purposes.  Copious irrigation was then performed of this incision.  Layered closure was performed utilizing 3-0 monocryl for the subcutaneous layer and 4-0 nylon for the skin surface in standard fashion.   The tourniquet was deflated at 70 minutes.  Fingertips were pink with brisk capillary refill after deflation of  tourniquet.  Copious irrigation had been performed.  Sterile dressings were provided followed by application of a volar and dorsal wrist splint utilizing plaster, extra padding was placed over the thumb CMC as well.  The operative drapes were broken down.  The patient was awoken from anesthesia safely and taken to PACU in stable condition.  I will see him back in the office in 2 weeks for postoperative followup.        Samuella Cota, MD Electronically signed, 12/06/22

## 2022-12-06 NOTE — Anesthesia Preprocedure Evaluation (Addendum)
Anesthesia Evaluation  Patient identified by MRN, date of birth, ID band Patient awake    Reviewed: Allergy & Precautions, H&P , NPO status , Patient's Chart, lab work & pertinent test results  History of Anesthesia Complications (+) PONV and history of anesthetic complications  Airway Mallampati: III  TM Distance: >3 FB Neck ROM: Full    Dental no notable dental hx. (+) Teeth Intact, Dental Advisory Given   Pulmonary neg pulmonary ROS   Pulmonary exam normal breath sounds clear to auscultation       Cardiovascular hypertension, Pt. on medications  Rhythm:Regular Rate:Normal     Neuro/Psych   Anxiety Depression    negative neurological ROS     GI/Hepatic negative GI ROS, Neg liver ROS,,,  Endo/Other  diabetes, Type 2, Oral Hypoglycemic Agents    Renal/GU negative Renal ROS  negative genitourinary   Musculoskeletal  (+) Arthritis , Osteoarthritis,    Abdominal   Peds  Hematology negative hematology ROS (+)   Anesthesia Other Findings   Reproductive/Obstetrics negative OB ROS                             Anesthesia Physical Anesthesia Plan  ASA: 2  Anesthesia Plan: General   Post-op Pain Management: Regional block*, Tylenol PO (pre-op)* and Toradol IV (intra-op)*   Induction: Intravenous  PONV Risk Score and Plan: 4 or greater and Ondansetron, Dexamethasone, Propofol infusion, TIVA and Midazolam  Airway Management Planned: LMA  Additional Equipment:   Intra-op Plan:   Post-operative Plan: Extubation in OR  Informed Consent: I have reviewed the patients History and Physical, chart, labs and discussed the procedure including the risks, benefits and alternatives for the proposed anesthesia with the patient or authorized representative who has indicated his/her understanding and acceptance.     Dental advisory given  Plan Discussed with: CRNA  Anesthesia Plan Comments:         Anesthesia Quick Evaluation

## 2022-12-07 ENCOUNTER — Encounter (HOSPITAL_BASED_OUTPATIENT_CLINIC_OR_DEPARTMENT_OTHER): Payer: Self-pay | Admitting: Orthopedic Surgery

## 2022-12-19 ENCOUNTER — Ambulatory Visit (INDEPENDENT_AMBULATORY_CARE_PROVIDER_SITE_OTHER): Payer: PPO | Admitting: Orthopedic Surgery

## 2022-12-19 ENCOUNTER — Other Ambulatory Visit (INDEPENDENT_AMBULATORY_CARE_PROVIDER_SITE_OTHER): Payer: Self-pay

## 2022-12-19 DIAGNOSIS — M1811 Unilateral primary osteoarthritis of first carpometacarpal joint, right hand: Secondary | ICD-10-CM

## 2022-12-19 DIAGNOSIS — Z9889 Other specified postprocedural states: Secondary | ICD-10-CM

## 2022-12-19 NOTE — Progress Notes (Signed)
   [name] - 72 y.o. male MRN [id number]  Date of birth: 1950/09/10  Office Visit Note: Visit Date: 12/19/2022 PCP: Monica Becton, MD Referred by: Monica Becton,*  Subjective:  HPI: [name] is a 72 y.o. male who presents today for follow up 2 weeks status post right wrist proximal row carpectomy with allograft spacer and thumb cmc denervation.  He is doing very well overall, pain is well-controlled, is taking only over-the-counter medication at this juncture.  Pertinent ROS were reviewed with the patient and found to be negative unless otherwise specified above in HPI.   Assessment & Plan: Visit Diagnoses:  1. Primary osteoarthritis of first carpometacarpal joint of right hand     Plan: Sutures removed today, short arm cast applied.  X-rays reviewed today which show stable appearance of the wrist status post proximal row carpectomy with appropriate placement of the allograft spacer, there is notable space between the capitate and the distal radius surface.  Follow-up in 3 weeks, remove cast at that time and will begin range of motion with therapy.  Follow-up: No follow-ups on file.   Meds & Orders: No orders of the defined types were placed in this encounter.   Orders Placed This Encounter  Procedures   XR Wrist Complete Right     Procedures: No procedures performed       Objective:   Vital Signs: There were no vitals taken for this visit.  Ortho Exam Right wrist: - Well-healing incision over the dorsal aspect of the wrist as well as the [facility] articulation, sutures in place, skin edges well-approximated, no erythema or drainage - Range of motion of the thumb is well-preserved, no significant pain with thumb circumduction, minimal pain with CMC grind currently - Digital range of motion is intact, hand is warm well-perfused, sensation intact diffusely in all distributions - Mild swelling notable throughout the dorsal aspect of the  wrist  Imaging: XR Wrist Complete Right  Result Date: 12/19/2022 X-rays of the wrist demonstrate stable appearance of the wrist status post proximal row carpectomy, there is notable spacing maintained between the distal radius and the capitate surface secondary to allograft placement    Saron Vanorman Fara Boros) Denese Killings, M.D. [facility] 9:52 PM

## 2022-12-24 ENCOUNTER — Encounter: Payer: Self-pay | Admitting: Sports Medicine

## 2022-12-24 ENCOUNTER — Other Ambulatory Visit: Payer: Self-pay | Admitting: Sports Medicine

## 2022-12-24 DIAGNOSIS — F411 Generalized anxiety disorder: Secondary | ICD-10-CM

## 2022-12-24 DIAGNOSIS — F320 Major depressive disorder, single episode, mild: Secondary | ICD-10-CM

## 2022-12-26 MED ORDER — ALPRAZOLAM 0.5 MG PO TABS: ORAL_TABLET | ORAL | 3 refills | Status: DC

## 2022-12-28 ENCOUNTER — Encounter: Payer: Self-pay | Admitting: Orthopedic Surgery

## 2023-01-04 ENCOUNTER — Other Ambulatory Visit: Payer: Self-pay | Admitting: Sports Medicine

## 2023-01-09 ENCOUNTER — Other Ambulatory Visit (INDEPENDENT_AMBULATORY_CARE_PROVIDER_SITE_OTHER): Payer: PPO

## 2023-01-09 ENCOUNTER — Ambulatory Visit (INDEPENDENT_AMBULATORY_CARE_PROVIDER_SITE_OTHER): Payer: PPO | Admitting: Orthopedic Surgery

## 2023-01-09 DIAGNOSIS — Z9889 Other specified postprocedural states: Secondary | ICD-10-CM | POA: Diagnosis not present

## 2023-01-09 DIAGNOSIS — M1811 Unilateral primary osteoarthritis of first carpometacarpal joint, right hand: Secondary | ICD-10-CM

## 2023-01-09 NOTE — Progress Notes (Signed)
   [name] - 72 y.o. male MRN [id number]  Date of birth: 25-Apr-1950  Office Visit Note: Visit Date: 01/09/2023 PCP: Monica Becton, MD Referred by: Monica Becton,*  Subjective:  HPI: [name] is a 72 y.o. male who presents today for follow up 5 weeks status post right wrist proximal row carpectomy/thumb cmc denervation.  Has some ongoing soreness at the wrist and thumb with associated stiffness.  Pain is controlled.  Pertinent ROS were reviewed with the patient and found to be negative unless otherwise specified above in HPI.   Assessment & Plan: Visit Diagnoses:  1. Status post proximal row carpectomy of wrist   2. Primary osteoarthritis of first carpometacarpal joint of right hand     Plan: He is doing well postoperatively.  X-rays today show stable appearance of the wrist status post proximal row carpectomy with appropriate maintenance of the spacing from the allograft spacer.  He will be seen by occupational therapy tomorrow for fabrication of a wrist orthosis.  For the time being, we will place him into a prefabricated wrist splint.  Follow-up with me in approximately 4 weeks.  Follow-up: No follow-ups on file.   Meds & Orders: No orders of the defined types were placed in this encounter.   Orders Placed This Encounter  Procedures   XR Wrist Complete Right   Ambulatory referral to Occupational Therapy     Procedures: No procedures performed       Objective:   Vital Signs: There were no vitals taken for this visit.  Ortho Exam Right wrist: - Diffuse swelling dorsally over the hand and wrist - Digital range of motion is preserved, near composite fist without full restriction - Sensation is intact distally, hand is warm well-perfused - Gentle wrist range of motion flexion/extension without significant pain - Thumb circumduction without significant pain  Imaging: XR Wrist Complete Right Result Date: 01/09/2023 X-rays of the right  wrist demonstrate stable appearance of the radiocarpal joint status post proximal row carpectomy    Sylvain Hasten Fara Boros) Yuna Pizzolato, M.D. [facility] 11:11 AM

## 2023-01-09 NOTE — Therapy (Signed)
 OUTPATIENT OCCUPATIONAL THERAPY ORTHO EVALUATION  Patient Name: [name] MRN: [id number] DOB:[birth year], 72 y.o., male Today's Date: 01/10/2023  PCP: Curtis Ned, MD REFERRING PROVIDER: Arlinda Buster, MD   END OF SESSION:  OT End of Session - 01/10/23 1056     Visit Number 1    Number of Visits 10    Date for OT Re-Evaluation 02/24/23    Authorization Type [employer]    OT Start Time 1059    OT Stop Time 1153    OT Time Calculation (min) 54 min    Equipment Utilized During Treatment orthotic materials    Activity Tolerance Patient tolerated treatment well;No increased pain;Patient limited by fatigue;Patient limited by pain    Behavior During Therapy [facility] for tasks assessed/performed;Anxious             Past Medical History:  Diagnosis Date   ADD (attention deficit disorder)    Anxiety    Arthritis 2016   bilateral hands   Cancer (HCC)    PROSTATE   Diabetes mellitus without complication (HCC)    Diverticulitis    Hyperlipidemia    Hypertension    Pain    HX OF 3 BACK SURGERIES - STILL HAS SEVERE BACK PAIN WITH PROLONGED SITTING OR STANDING   PONV (postoperative nausea and vomiting)    NAUSEA AFTER SHOULDER SURGERY   Reflux    MILD - NO DAILY MEDS - ANTIACID IF NEEDED   Past Surgical History:  Procedure Laterality Date   ALLOGRAFT APPLICATION Right 12/06/2022   Procedure: POSSIBLE ALLOGRAFT SPACER;  Surgeon: Arlinda Buster, MD;  Location: [facility];  Service: Orthopedics;  Laterality: Right;   BACK SURGERY     X3  1993, 1998, 2007   CARPECTOMY Right 12/06/2022   Procedure: RIGHT WRIST PROXIMAL ROW CARPECTOMY / THUMB CMC DENERVATION;  Surgeon: Arlinda Buster, MD;  Location: [facility];  Service: Orthopedics;  Laterality: Right;   COLON SURGERY  2008   COLON RESECTION FOR DIVERTICULITIS   HERNIA REPAIR  1997 OR 1998   ING HERNIA REPAIR   RIGHT SHOULDER 2013     ROBOT ASSISTED LAPAROSCOPIC  RADICAL PROSTATECTOMY N/A 02/13/2013   Procedure: ROBOTIC ASSISTED LAPAROSCOPIC RADICAL PROSTATECTOMY,, LYSIS OF PELVIC / INTRA-ABDOMINAL ADHESIONS;  Surgeon: Alm GORMAN Fragmin, MD;  Location: [facility];  Service: Urology;  Laterality: N/A;   Patient Active Problem List   Diagnosis Date Noted   Slac (scapholunate advanced collapse) of wrist, right 12/06/2022   Sinobronchitis 05/31/2022   Chalazion of left eye 04/20/2022   Hypnagogic hallucinations 04/05/2022   Primary osteoarthritis of first carpometacarpal joint of right hand 09/03/2021   Primary osteoarthritis of right knee 07/02/2021   Seborrheic dermatitis 03/03/2021   Tinea cruris 07/06/2020   Primary insomnia 06/03/2020   Substance or medication-induced sleep disorder, insomnia type (HCC) 03/20/2020   Chronic eczematous otitis externa of left ear 03/19/2020   Rotator cuff dysfunction, right 06/03/2019   Controlled type 2 diabetes mellitus without complication, without long-term current use of insulin (HCC) 03/15/2019   DDD (degenerative disc disease), cervical 03/06/2018   Erythro-telangiectatic rosacea 08/25/2017   Trigger ring finger of right hand 03/31/2017   Hyperlipidemia, mixed 03/08/2017   Malignant melanoma, left posterior shoulder 09/05/2016   Generalized anxiety with depression 01/01/2016   Postlaminectomy syndrome, lumbar region 11/16/2015   Bilateral primary osteoarthritis of first carpometacarpal joints 11/16/2015   Post-surgical erectile dysfunction 11/16/2015   Diverticulosis 02/13/2015   Benign paroxysmal positional vertigo 02/02/2015   Annual  physical exam 01/29/2015   Essential hypertension, benign 01/29/2015   Trigger middle finger of right hand 01/14/2015   Primary osteoarthritis, right second MCP 01/10/2014   Malignant neoplasm of prostate (HCC) 02/13/2013    ONSET DATE: DOS 12/06/22  REFERRING DIAG: S01.109 (ICD-10-CM) - Status post proximal row carpectomy of wrist   THERAPY DIAG:  Localized  edema  Muscle weakness (generalized)  Pain in right wrist  Stiffness of right wrist, not elsewhere classified  Stiffness of right hand, not elsewhere classified  Other lack of coordination  Rationale for Evaluation and Treatment: Rehabilitation  SUBJECTIVE:   SUBJECTIVE STATEMENT: 5 weeks post-op Rt prox row carpectomy now.  He states doing well so far after surgery, not having any significant pain but just some stiff and sore feeling.  He has a bulky dressing on.  He states being retired but enjoying a robust and healthy active lifestyle which is now on pause after the surgery.  He does state before the surgery he could barely tolerate shaking someone's hand due to the pain and stiffness in his wrist.    PERTINENT HISTORY: Per referral: Splint fabrication/ early ROM Right wrist proximal row carpectomy/thumb cmc denervation   PRECAUTIONS: None  RED FLAGS: None   WEIGHT BEARING RESTRICTIONS: Yes nonweightbearing in right wrist now  PAIN:  Are you having pain? Yes: NPRS scale: 1-2/10 just sore and stiff feeling in the right wrist Pain location: Right wrist/surgical area Pain description: Sore Aggravating factors: Moving beyond limits Relieving factors: Rest  FALLS: Has patient fallen in last 6 months? No  LIVING ENVIRONMENT: Lives with: lives with their family Lives in: House/apartment Has following equipment at home: None  PLOF: Independent  PATIENT GOALS: To improve the use of his right nondominant arm  NEXT MD VISIT: 02/06/2023    OBJECTIVE: (All objective assessments below are from initial evaluation on: 01/10/23 unless otherwise specified.)   HAND DOMINANCE: Left  ADLs: Overall ADLs: States decreased ability to grab, hold household objects, pain and difficulty to open containers, perform FMS tasks (manipulate fasteners on clothing), mild bathing problems as well.    FUNCTIONAL OUTCOME MEASURES: Eval: Quick DASH TBD % impairment today  (Higher % Score   =  More Impairment)     UPPER EXTREMITY ROM     Shoulder to Wrist AROM Right eval  Forearm supination 49  Forearm pronation  88  Wrist flexion 31  Wrist extension 5  Wrist ulnar deviation 20  Wrist radial deviation (-2)  Functional dart thrower's motion (F-DTM) in ulnar flexion   F-DTM in radial extension    (Blank rows = not tested)   Hand AROM Right eval  Full Fist Ability (or Gap to Distal Palmar Crease) 3cm gap from MF tip to [facility]  Thumb Opposition  (Kapandji Scale)  4/10  Thumb MCP (0-60)   Thumb IP (0-80)   Thumb Radial Abduction Span   Thumb Palmar Abduction Span   (Blank rows = not tested)   UPPER EXTREMITY MMT:    Eval:  NT at eval due to recent and still healing injuries. Will be tested when appropriate.   MMT Right TBD  Shoulder flexion   Shoulder abduction   Shoulder adduction   Shoulder extension   Shoulder internal rotation   Shoulder external rotation   Middle trapezius   Lower trapezius   Elbow flexion   Elbow extension   Forearm supination   Forearm pronation   Wrist flexion   Wrist extension   Wrist ulnar deviation  Wrist radial deviation   (Blank rows = not tested)  HAND FUNCTION: Eval: Observed weakness in affected right hand.  Will be tested when appropriate Grip strength Right: TBD lbs, Left: TBD lbs   COORDINATION: Eval: Observed coordination impairments with affected right hand.  Will be tested when able 9 Hole Peg Test Right: TBD sec (TBD sec is WFL)   SENSATION: Eval:  Light touch intact today, though diminished around sx area    EDEMA:   Eval:  Mildly swollen in right hand and wrist today  COGNITION: Eval: Overall cognitive status: WFL for evaluation today   OBSERVATIONS:   Eval: Typical dorsal swelling near the surgery site, no overt signs of infection otherwise, pain is low and he seems understanding with decent body awareness.  Very stiff wrist and fingers for now   TODAY'S TREATMENT:  Post-evaluation treatment:    Custom orthotic fabrication was indicated due to pt's recent wrist surgery and need for safe, functional positioning. OT fabricated custom circumferential wrist immobilization orthosis for pt today to immobilize and protect his healing right wrist. It fit well with no areas of pressure, pt states a comfortable fit. Pt was educated on the wearing schedule (on at all times except for hygiene and exercises), to avoid exposing it to sources of heat, to wipe clean as needed (do not wash, use harsh detergents), to call or come in ASAP if it is causing any irritation or is not achieving desired function. It will be checked/adjusted in upcoming sessions, as needed. Pt states understanding all directions.    For safety/self-care he was educated on no gripping pushing or pulling or weightbearing through the right hand and arm now.  He should keep his surgical area moisturized with an oil-based topical and perform gentle skin and scar mobilizations around the surgical site 3-4 times a day for 2 minutes at a time.  Additionally he was given the following home exercise program to complete about every 2 hours during the day.  He should do these things nonpainful he starting with a 50% arc of motion working his way towards a full arc of motion as tolerated.  He was told to never force motion.   Exercises - Reach arms upward   - 4 x daily - 10 reps - Turn [employer] Facing Up & Down  - 4-6 x daily - 10-15 reps - Bend and Pull Back Wrist SLOWLY  - 4 x daily - 10-15 reps - Windshield Wipers   - 4 x daily - 10-15 reps - Tendon Glides  - 4-6 x daily - 3-5 reps - 2-3 seconds hold - Finger Spreading  - 4-6 x daily - 10-15 reps - Thumb Opposition  - 4-6 x daily - 10 reps - Seated Thumb Circumduction AROM  - 4-6 x daily - 10-15 reps Patient Education - Scar Massage  He leaves in no pain stating understanding and that his orthosis is fitting well  PATIENT EDUCATION: Education details: See tx section above for details   Person educated: Patient Education method: Verbal Instruction, Teach back, Handouts  Education comprehension: States and demonstrates understanding, Additional Education required    HOME EXERCISE PROGRAM: Access Code: [email or web address] URL: [email or web address] Date: 01/10/2023 Prepared by: Melvenia Ada   GOALS: Goals reviewed with patient? Yes   SHORT TERM GOALS: (STG required if POC>30 days) Target Date: 01/20/2023  Pt will obtain protective, custom orthotic. Goal status: MET   2.  Pt will demo/state understanding of initial HEP to improve pain levels and prerequisite  motion. Goal status: INITIAL   LONG TERM GOALS: Target Date: 02/24/23  Pt will improve functional ability by decreased impairment per Quick DASH assessment from TBD to TBD or better, for better quality of life. Goal status: INITIAL-pending baseline measure next session  2.  Pt will improve grip strength in right hand from unsafe to test at evaluation to at least 40 lbs for functional use at home and in IADLs. Goal status: INITIAL  3.  Pt will improve A/ROM in right wrist flexion/extension from 31/5 to at least 40 degrees each, to have functional motion for tasks like reach and grasp.  Goal status: INITIAL  4.  Pt will improve strength in right wrist flexion/extension from unsafe to test at evaluation to at least 4+/5 MMT to have increased functional ability to carry out selfcare and higher-level homecare tasks with less difficulty. Goal status: INITIAL  5.  Pt will improve coordination skills in right hand, as seen by within functional limit score on nine-hole peg testing to have increased functional ability to carry out fine motor tasks (fasteners, etc.) and more complex, coordinated IADLs (meal prep, sports, etc.).  Goal status: INITIAL  ASSESSMENT:  CLINICAL IMPRESSION: Patient is a 72y.o. male who was seen today for occupational therapy evaluation for right wrist proximal row carpectomy and  subsequent swelling, pain, weakness, stiffness and decreased functional ability.  Will benefit from outpatient occupational therapy to safely increase functional status and quality of life.   PERFORMANCE DEFICITS: in functional skills including ADLs, IADLs, coordination, dexterity, proprioception, sensation, edema, ROM, strength, pain, fascial restrictions, flexibility, Fine motor control, Gross motor control, body mechanics, endurance, decreased knowledge of precautions, wound, and UE functional use, cognitive skills including problem solving and safety awareness, and psychosocial skills including coping strategies, habits, and routines and behaviors.   IMPAIRMENTS: are limiting patient from ADLs, IADLs, leisure, and social participation.   COMORBIDITIES: may have co-morbidities  that affects occupational performance. Patient will benefit from skilled OT to address above impairments and improve overall function.  MODIFICATION OR ASSISTANCE TO COMPLETE EVALUATION: No modification of tasks or assist necessary to complete an evaluation.  OT OCCUPATIONAL PROFILE AND HISTORY: Problem focused assessment: Including review of records relating to presenting problem.  CLINICAL DECISION MAKING: Moderate - several treatment options, min-mod task modification necessary  REHAB POTENTIAL: Excellent  EVALUATION COMPLEXITY: Low      PLAN:  OT FREQUENCY: 1-2x/week  OT DURATION: 6 weeks through 02/24/2023 and up to 10 total visits as necessary  PLANNED INTERVENTIONS: 97168 OT Re-evaluation, 97535 self care/ADL training, 02889 therapeutic exercise, 97530 therapeutic activity, 97112 neuromuscular re-education, 97140 manual therapy, 97035 ultrasound, 97039 fluidotherapy, 97010 moist heat, 97010 cryotherapy, 97034 contrast bath, 97032 electrical stimulation (manual), 97760 Orthotics management and training, 02239 Splinting (initial encounter), [id number] Subsequent splinting/medication, scar mobilization, passive  range of motion, compression bandaging, Dry needling, coping strategies training, and patient/family education  RECOMMENDED OTHER SERVICES: None now  CONSULTED AND AGREED WITH PLAN OF CARE: Patient  PLAN FOR NEXT SESSION:   Check orthosis as needed, check initial recommendations and home exercise program and upgrade per typical protocols and as tolerated, do quick [employer], OTR/L, CHT 01/10/2023, 5:17 PM

## 2023-01-10 ENCOUNTER — Encounter: Payer: Self-pay | Admitting: Rehabilitative and Restorative Service Providers"

## 2023-01-10 ENCOUNTER — Ambulatory Visit: Payer: PPO | Admitting: Rehabilitative and Restorative Service Providers"

## 2023-01-10 DIAGNOSIS — M25641 Stiffness of right hand, not elsewhere classified: Secondary | ICD-10-CM | POA: Diagnosis not present

## 2023-01-10 DIAGNOSIS — M25531 Pain in right wrist: Secondary | ICD-10-CM | POA: Diagnosis not present

## 2023-01-10 DIAGNOSIS — R278 Other lack of coordination: Secondary | ICD-10-CM

## 2023-01-10 DIAGNOSIS — R600 Localized edema: Secondary | ICD-10-CM

## 2023-01-10 DIAGNOSIS — M6281 Muscle weakness (generalized): Secondary | ICD-10-CM | POA: Diagnosis not present

## 2023-01-10 DIAGNOSIS — M25631 Stiffness of right wrist, not elsewhere classified: Secondary | ICD-10-CM

## 2023-01-18 NOTE — Therapy (Signed)
 OUTPATIENT OCCUPATIONAL THERAPY TREATMENT NOTE  Patient Name: [name] MRN: [id number] DOB:[birth year], 73 y.o., male Today's Date: 01/20/2023  PCP: Curtis Ned, MD REFERRING PROVIDER: Arlinda Buster, MD   END OF SESSION:  OT End of Session - 01/20/23 1019     Visit Number 2    Number of Visits 10    Date for OT Re-Evaluation 02/24/23    Authorization Type [employer]    OT Start Time 1021    OT Stop Time 1106    OT Time Calculation (min) 45 min    Equipment Utilized During Treatment --    Activity Tolerance Patient tolerated treatment well;No increased pain;Patient limited by fatigue;Patient limited by pain    Behavior During Therapy [facility] for tasks assessed/performed;Anxious              Past Medical History:  Diagnosis Date   ADD (attention deficit disorder)    Anxiety    Arthritis 2016   bilateral hands   Cancer (HCC)    PROSTATE   Diabetes mellitus without complication (HCC)    Diverticulitis    Hyperlipidemia    Hypertension    Pain    HX OF 3 BACK SURGERIES - STILL HAS SEVERE BACK PAIN WITH PROLONGED SITTING OR STANDING   PONV (postoperative nausea and vomiting)    NAUSEA AFTER SHOULDER SURGERY   Reflux    MILD - NO DAILY MEDS - ANTIACID IF NEEDED   Past Surgical History:  Procedure Laterality Date   ALLOGRAFT APPLICATION Right 12/06/2022   Procedure: POSSIBLE ALLOGRAFT SPACER;  Surgeon: Arlinda Buster, MD;  Location: [facility];  Service: Orthopedics;  Laterality: Right;   BACK SURGERY     X3  1993, 1998, 2007   CARPECTOMY Right 12/06/2022   Procedure: RIGHT WRIST PROXIMAL ROW CARPECTOMY / THUMB CMC DENERVATION;  Surgeon: Arlinda Buster, MD;  Location: [facility];  Service: Orthopedics;  Laterality: Right;   COLON SURGERY  2008   COLON RESECTION FOR DIVERTICULITIS   HERNIA REPAIR  1997 OR 1998   ING HERNIA REPAIR   RIGHT SHOULDER 2013     ROBOT ASSISTED LAPAROSCOPIC RADICAL PROSTATECTOMY  N/A 02/13/2013   Procedure: ROBOTIC ASSISTED LAPAROSCOPIC RADICAL PROSTATECTOMY,, LYSIS OF PELVIC / INTRA-ABDOMINAL ADHESIONS;  Surgeon: Alm GORMAN Fragmin, MD;  Location: [facility];  Service: Urology;  Laterality: N/A;   Patient Active Problem List   Diagnosis Date Noted   Slac (scapholunate advanced collapse) of wrist, right 12/06/2022   Sinobronchitis 05/31/2022   Chalazion of left eye 04/20/2022   Hypnagogic hallucinations 04/05/2022   Primary osteoarthritis of first carpometacarpal joint of right hand 09/03/2021   Primary osteoarthritis of right knee 07/02/2021   Seborrheic dermatitis 03/03/2021   Tinea cruris 07/06/2020   Primary insomnia 06/03/2020   Substance or medication-induced sleep disorder, insomnia type (HCC) 03/20/2020   Chronic eczematous otitis externa of left ear 03/19/2020   Rotator cuff dysfunction, right 06/03/2019   Controlled type 2 diabetes mellitus without complication, without long-term current use of insulin (HCC) 03/15/2019   DDD (degenerative disc disease), cervical 03/06/2018   Erythro-telangiectatic rosacea 08/25/2017   Trigger ring finger of right hand 03/31/2017   Hyperlipidemia, mixed 03/08/2017   Malignant melanoma, left posterior shoulder 09/05/2016   Generalized anxiety with depression 01/01/2016   Postlaminectomy syndrome, lumbar region 11/16/2015   Bilateral primary osteoarthritis of first carpometacarpal joints 11/16/2015   Post-surgical erectile dysfunction 11/16/2015   Diverticulosis 02/13/2015   Benign paroxysmal positional vertigo 02/02/2015   Annual  physical exam 01/29/2015   Essential hypertension, benign 01/29/2015   Trigger middle finger of right hand 01/14/2015   Primary osteoarthritis, right second MCP 01/10/2014   Malignant neoplasm of prostate (HCC) 02/13/2013    ONSET DATE: DOS 12/06/22  REFERRING DIAG: S01.109 (ICD-10-CM) - Status post proximal row carpectomy of wrist   THERAPY DIAG:  Localized edema  Muscle weakness  (generalized)  Pain in right wrist  Stiffness of right wrist, not elsewhere classified  Stiffness of right hand, not elsewhere classified  Other lack of coordination  Rationale for Evaluation and Treatment: Rehabilitation  PERTINENT HISTORY: Per referral: Splint fabrication/ early ROM Right wrist proximal row carpectomy/thumb cmc denervation  He states doing well so far after surgery, not having any significant pain but just some stiff and sore feeling.  He has a bulky dressing on.  He states being retired but enjoying a robust and healthy active lifestyle which is now on pause after the surgery.  He does state before the surgery he could barely tolerate shaking someone's hand due to the pain and stiffness in his wrist.   PRECAUTIONS: None;  RED FLAGS: None   WEIGHT BEARING RESTRICTIONS: Yes nonweightbearing in right wrist now   SUBJECTIVE:   SUBJECTIVE STATEMENT: 6 weeks post-op Rt prox row carpectomy now.  He states doing well, weaning from brace in day, but he has a lagging RF in extension. SABRA    PAIN:  Are you having pain? 0/10 now at rest and none significant with exercises now   PATIENT GOALS: To improve the use of his right nondominant arm  NEXT MD VISIT: 02/06/2023    OBJECTIVE: (All objective assessments below are from initial evaluation on: 01/10/23 unless otherwise specified.)   HAND DOMINANCE: Left  ADLs: Overall ADLs: States decreased ability to grab, hold household objects, pain and difficulty to open containers, perform FMS tasks (manipulate fasteners on clothing), mild bathing problems as well.    FUNCTIONAL OUTCOME MEASURES: 01/20/23: Quick DASH 38.6 % impairment today  (Higher % Score  =  More Impairment)     UPPER EXTREMITY ROM     Shoulder to Wrist AROM Right eval Rt 01/19/13  Forearm supination 49 68  Forearm pronation  88 90  Wrist flexion 31 40  Wrist extension 5 19  Wrist ulnar deviation 20 28  Wrist radial deviation (-2) 0   Functional dart thrower's motion (F-DTM) in ulnar flexion    F-DTM in radial extension     (Blank rows = not tested)   Hand AROM Right eval Rt 01/20/23  Full Fist Ability (or Gap to Distal Palmar Crease) 3cm gap from MF tip to [facility] Loose full fist  Thumb Opposition  (Kapandji Scale)  4/10 5.5/10  Thumb MCP (0-60)    Thumb IP (0-80)    Thumb Radial Abduction Span    Thumb Palmar Abduction Span    (Blank rows = not tested)   UPPER EXTREMITY MMT:    Eval:  NT at eval due to recent and still healing injuries. Will be tested when appropriate.   MMT Right TBD  Shoulder flexion   Shoulder abduction   Shoulder adduction   Shoulder extension   Shoulder internal rotation   Shoulder external rotation   Middle trapezius   Lower trapezius   Elbow flexion   Elbow extension   Forearm supination   Forearm pronation   Wrist flexion   Wrist extension   Wrist ulnar deviation   Wrist radial deviation   (Blank rows =  not tested)  HAND FUNCTION: Eval: Observed weakness in affected right hand.  Will be tested when appropriate Grip strength Right: TBD lbs, Left: TBD lbs   COORDINATION: 01/20/23: 9 Hole Peg Test Right: 38 sec (33 sec is WFL)   Eval: Observed coordination impairments with affected right hand.  Will be tested when able   SENSATION: Eval:  Light touch intact today, though diminished around sx area    EDEMA:   Eval:  Mildly swollen in right hand and wrist today  OBSERVATIONS:   Eval: Typical dorsal swelling near the surgery site, no overt signs of infection otherwise, pain is low and he seems understanding with decent body awareness.  Very stiff wrist and fingers for now   TODAY'S TREATMENT:  01/20/23: He starts by discussing home and functional tasks in pain and problems that he has been having.  Use the QuickDASH for this to also help get a baseline measure of functional impairment/ability.  Next, he does have functional activity for fine motor skills-the nine-hole  peg test.  He was educated to do things like this light weight less than 3 pound objects at home as tolerated.  He also performs active range of motion for review of exercise as well as new measures which shows improvements across the board.  Now that he is 6 weeks postop, OT also educates him on upgrades to his home exercise program including light tolerable stretches at all planes of motion at the forearm and wrist now.  These things are bolded below on his HEP.  Each 1 was explained and demonstrated and he performs back, only having pain and difficulties with spatula stretches and radial deviation.  He was told to withhold these if they hurt him or bother him at all and simply perform active range of motion as taught prior.  Additionally for his numbness and tingling, OT assigns some radial nerve glides which do not aggravate him but he does feel active stretches through his arm during these.  He leaves in no significant pain states understanding all directions and to not lift push or pull anything heavy as he weans out of his orthosis and to consider wearing it when out of the home and at night still.  Exercises - Reach arms upward   - 4 x daily - 10 reps - Turn [employer] Facing Up & Down  - 4-6 x daily - 10-15 reps - Bend and Pull Back Wrist SLOWLY  - 4 x daily - 10-15 reps - Windshield Wipers   - 4 x daily - 10-15 reps - Tendon Glides  - 4-6 x daily - 3-5 reps - 2-3 seconds hold - Finger Spreading  - 4-6 x daily - 10-15 reps - Thumb Opposition  - 4-6 x daily - 10 reps - Seated Thumb Circumduction AROM  - 4-6 x daily - 10-15 reps - Standing Radial Nerve Glide  - 4-6 x daily - 1 sets - 10-15 reps - Forearm Supination Stretch  - 3-4 x daily - 3-5 reps - 15 sec hold - Wrist Flexion Stretch  - 4 x daily - 3-5 reps - 15 sec hold - Wrist Extension Stretch Pronated  - 4 x daily - 3-5 reps - 15 hold - Wrist Prayer Stretch  - 4 x daily - 3-5 reps - 15 sec hold - SPATULA stretch   - 4 x daily - 3 reps -  15 sec hold  PATIENT EDUCATION: Education details: See tx section above for details  Person educated:  Patient Education method: Verbal Instruction, Teach back, Handouts  Education comprehension: States and demonstrates understanding, Additional Education required    HOME EXERCISE PROGRAM: Access Code: [email or web address] URL: [email or web address] Date: 01/10/2023 Prepared by: Melvenia Ada   GOALS: Goals reviewed with patient? Yes   SHORT TERM GOALS: (STG required if POC>30 days) Target Date: 01/20/2023  Pt will obtain protective, custom orthotic. Goal status: MET   2.  Pt will demo/state understanding of initial HEP to improve pain levels and prerequisite motion. Goal status: 01/20/23: MET   LONG TERM GOALS: Target Date: 02/24/23  Pt will improve functional ability by decreased impairment per Quick DASH assessment from 38% to 15% or better, for better quality of life. Goal status: INITIAL-pending baseline measure next session  2.  Pt will improve grip strength in right hand from unsafe to test at evaluation to at least 40 lbs for functional use at home and in IADLs. Goal status: INITIAL  3.  Pt will improve A/ROM in right wrist flexion/extension from 31/5 to at least 40 degrees each, to have functional motion for tasks like reach and grasp.  Goal status: INITIAL  4.  Pt will improve strength in right wrist flexion/extension from unsafe to test at evaluation to at least 4+/5 MMT to have increased functional ability to carry out selfcare and higher-level homecare tasks with less difficulty. Goal status: INITIAL  5.  Pt will improve coordination skills in right hand, as seen by within functional limit score on nine-hole peg testing to have increased functional ability to carry out fine motor tasks (fasteners, etc.) and more complex, coordinated IADLs (meal prep, sports, etc.).  Goal status: INITIAL  ASSESSMENT:  CLINICAL IMPRESSION: 01/20/23: He is tolerating light  stretches now in all planes but radial and ulnar deviation which are still a bit tender.  He is improving his finger and thumb use as well as his fisting ability and motion.  We will carefully progress this over the next 2 weeks before getting into some hand and arm strengthening very gently at 8 weeks postop.  Eval: Patient is a 72y.o. male who was seen today for occupational therapy evaluation for right wrist proximal row carpectomy and subsequent swelling, pain, weakness, stiffness and decreased functional ability.  Will benefit from outpatient occupational therapy to safely increase functional status and quality of life.     PLAN:  OT FREQUENCY: 1-2x/week  OT DURATION: 6 weeks through 02/24/2023 and up to 10 total visits as necessary  PLANNED INTERVENTIONS: 97168 OT Re-evaluation, 97535 self care/ADL training, 02889 therapeutic exercise, 97530 therapeutic activity, 97112 neuromuscular re-education, 97140 manual therapy, 97035 ultrasound, 97039 fluidotherapy, 97010 moist heat, 97010 cryotherapy, 97034 contrast bath, 97032 electrical stimulation (manual), 97760 Orthotics management and training, 02239 Splinting (initial encounter), [id number] Subsequent splinting/medication, scar mobilization, passive range of motion, compression bandaging, Dry needling, coping strategies training, and patient/family education  CONSULTED AND AGREED WITH PLAN OF CARE: Patient  PLAN FOR NEXT SESSION:   Continue to progress range of motion and stretches avoiding strengthening till 8 weeks postop.  Can also perform light proprioceptive activities to help regain neuromuscular feedback at this new wrist joint.   Melvenia Ada, OTR/L, CHT 01/20/2023, 12:14 PM

## 2023-01-20 ENCOUNTER — Encounter: Payer: Self-pay | Admitting: Rehabilitative and Restorative Service Providers"

## 2023-01-20 ENCOUNTER — Ambulatory Visit (INDEPENDENT_AMBULATORY_CARE_PROVIDER_SITE_OTHER): Payer: PPO | Admitting: Rehabilitative and Restorative Service Providers"

## 2023-01-20 DIAGNOSIS — R600 Localized edema: Secondary | ICD-10-CM

## 2023-01-20 DIAGNOSIS — R278 Other lack of coordination: Secondary | ICD-10-CM | POA: Diagnosis not present

## 2023-01-20 DIAGNOSIS — M6281 Muscle weakness (generalized): Secondary | ICD-10-CM | POA: Diagnosis not present

## 2023-01-20 DIAGNOSIS — M25641 Stiffness of right hand, not elsewhere classified: Secondary | ICD-10-CM | POA: Diagnosis not present

## 2023-01-20 DIAGNOSIS — M25531 Pain in right wrist: Secondary | ICD-10-CM

## 2023-01-20 DIAGNOSIS — M25631 Stiffness of right wrist, not elsewhere classified: Secondary | ICD-10-CM

## 2023-01-21 ENCOUNTER — Encounter (INDEPENDENT_AMBULATORY_CARE_PROVIDER_SITE_OTHER): Payer: PPO | Admitting: Sports Medicine

## 2023-01-21 DIAGNOSIS — F320 Major depressive disorder, single episode, mild: Secondary | ICD-10-CM | POA: Diagnosis not present

## 2023-01-23 MED ORDER — BUPROPION HCL ER (XL) 300 MG PO TB24: 300.0000 mg | ORAL_TABLET | Freq: Every day | ORAL | 3 refills | Status: DC

## 2023-01-23 NOTE — Telephone Encounter (Signed)

## 2023-01-26 NOTE — Therapy (Addendum)
OUTPATIENT OCCUPATIONAL THERAPY TREATMENT & DISCHARGE  NOTE  Patient Name: [name] MRN: [id number] DOB:1950/07/01, 73 y.o., male Today's Date: 01/27/2023  PCP: Rodney Langton, MD REFERRING PROVIDER: Samuella Cota, MD           OCCUPATIONAL THERAPY DISCHARGE SUMMARY  Visits from Start of Care: 3  Current functional level related to goals / functional outcomes: 02/06/23: The patient canceled his follow-up appointments after the last session, sent OT and message stating that the doctor released him from therapy because he is doing so well.  Goals could not be finalized or addressed.  He is discharged at this point   Fannie Knee, OTR/L, CHT 02/06/23        END OF SESSION:  OT End of Session - 01/27/23 1015     Visit Number 3    Number of Visits 10    Date for OT Re-Evaluation 02/24/23    Authorization Type [employer]    OT Start Time 1015    OT Stop Time 1100    OT Time Calculation (min) 45 min    Activity Tolerance Patient tolerated treatment well;No increased pain;Patient limited by fatigue;Patient limited by pain    Behavior During Therapy [facility] for tasks assessed/performed               Past Medical History:  Diagnosis Date   ADD (attention deficit disorder)    Anxiety    Arthritis 2016   bilateral hands   Cancer (HCC)    PROSTATE   Diabetes mellitus without complication (HCC)    Diverticulitis    Hyperlipidemia    Hypertension    Pain    HX OF 3 BACK SURGERIES - STILL HAS SEVERE BACK PAIN WITH PROLONGED SITTING OR STANDING   PONV (postoperative nausea and vomiting)    NAUSEA AFTER SHOULDER SURGERY   Reflux    MILD - NO DAILY MEDS - ANTIACID IF NEEDED   Past Surgical History:  Procedure Laterality Date   ALLOGRAFT APPLICATION Right 12/06/2022   Procedure: POSSIBLE ALLOGRAFT SPACER;  Surgeon: Samuella Cota, MD;  Location: [facility];  Service: Orthopedics;  Laterality: Right;   BACK SURGERY      X3  1993, 1998, 2007   CARPECTOMY Right 12/06/2022   Procedure: RIGHT WRIST PROXIMAL ROW CARPECTOMY / THUMB CMC DENERVATION;  Surgeon: Samuella Cota, MD;  Location: [facility];  Service: Orthopedics;  Laterality: Right;   COLON SURGERY  2008   COLON RESECTION FOR DIVERTICULITIS   HERNIA REPAIR  1997 OR 1998   ING HERNIA REPAIR   RIGHT SHOULDER 2013     ROBOT ASSISTED LAPAROSCOPIC RADICAL PROSTATECTOMY N/A 02/13/2013   Procedure: ROBOTIC ASSISTED LAPAROSCOPIC RADICAL PROSTATECTOMY,, LYSIS OF PELVIC / INTRA-ABDOMINAL ADHESIONS;  Surgeon: Valetta Fuller, MD;  Location: [facility];  Service: Urology;  Laterality: N/A;   Patient Active Problem List   Diagnosis Date Noted   Slac (scapholunate advanced collapse) of wrist, right 12/06/2022   Sinobronchitis 05/31/2022   Chalazion of left eye 04/20/2022   Hypnagogic hallucinations 04/05/2022   Primary osteoarthritis of first carpometacarpal joint of right hand 09/03/2021   Primary osteoarthritis of right knee 07/02/2021   Seborrheic dermatitis 03/03/2021   Tinea cruris 07/06/2020   Primary insomnia 06/03/2020   Substance or medication-induced sleep disorder, insomnia type (HCC) 03/20/2020   Chronic eczematous otitis externa of left ear 03/19/2020   Rotator cuff dysfunction, right 06/03/2019   Controlled type 2 diabetes mellitus without complication, without long-term current use  of insulin (HCC) 03/15/2019   DDD (degenerative disc disease), cervical 03/06/2018   Erythro-telangiectatic rosacea 08/25/2017   Trigger ring finger of right hand 03/31/2017   Hyperlipidemia, mixed 03/08/2017   Malignant melanoma, left posterior shoulder 09/05/2016   Generalized anxiety with depression 01/01/2016   Postlaminectomy syndrome, lumbar region 11/16/2015   Bilateral primary osteoarthritis of first carpometacarpal joints 11/16/2015   Post-surgical erectile dysfunction 11/16/2015   Diverticulosis 02/13/2015   Benign paroxysmal positional  vertigo 02/02/2015   Annual physical exam 01/29/2015   Essential hypertension, benign 01/29/2015   Trigger middle finger of right hand 01/14/2015   Primary osteoarthritis, right second MCP 01/10/2014   Malignant neoplasm of prostate (HCC) 02/13/2013    ONSET DATE: DOS 12/06/22  REFERRING DIAG: Z61.096 (ICD-10-CM) - Status post proximal row carpectomy of wrist   THERAPY DIAG:  Localized edema  Muscle weakness (generalized)  Pain in right wrist  Stiffness of right wrist, not elsewhere classified  Stiffness of right hand, not elsewhere classified  Other lack of coordination  Rationale for Evaluation and Treatment: Rehabilitation  PERTINENT HISTORY: Per referral: "Splint fabrication/ early ROM Right wrist proximal row carpectomy/thumb cmc denervation"  He states doing well so far after surgery, not having any significant pain but just some stiff and sore feeling.  He has a bulky dressing on.  He states being retired but enjoying a robust and healthy active lifestyle which is now on "pause" after the surgery.  He does state before the surgery he could barely tolerate shaking someone's hand due to the pain and stiffness in his wrist.   PRECAUTIONS: None;  RED FLAGS: None   WEIGHT BEARING RESTRICTIONS: Yes nonweightbearing in right wrist now   SUBJECTIVE:   SUBJECTIVE STATEMENT: 7 weeks post-op Rt prox row carpectomy now.  He states not feeling like he's made much progress and his pinching is weak. He was reminded that he shouldn't be strengthening yet, and that he may not feel "great" for up to 1 year.     PAIN:  Are you having pain?  0/10 now at rest but he does have pain with movement and exercises  PATIENT GOALS: To improve the use of his right nondominant arm  NEXT MD VISIT: 02/06/2023    OBJECTIVE: (All objective assessments below are from initial evaluation on: 01/10/23 unless otherwise specified.)   HAND DOMINANCE: Left  ADLs: Overall ADLs: States decreased  ability to grab, hold household objects, pain and difficulty to open containers, perform FMS tasks (manipulate fasteners on clothing), mild bathing problems as well.    FUNCTIONAL OUTCOME MEASURES: 01/20/23: Quick DASH 38.6 % impairment today  (Higher % Score  =  More Impairment)     UPPER EXTREMITY ROM     Shoulder to Wrist AROM Right eval Rt 01/19/13 Rt 01/27/23  Forearm supination 49 68   Forearm pronation  88 90   Wrist flexion 31 40 43  Wrist extension 5 19 15   Wrist ulnar deviation 20 28 17  (22* after manual therapy  Wrist radial deviation (-2) 0 1 (7* after manual therapy)   Functional dart thrower's motion (F-DTM) in ulnar flexion     F-DTM in radial extension      (Blank rows = not tested)   Hand AROM Right eval Rt 01/20/23 Rt 01/27/23  Full Fist Ability (or Gap to Distal Palmar Crease) 3cm gap from MF tip to [facility] Loose full fist   Thumb Opposition  (Kapandji Scale)  4/10 5.5/10 6/10  Thumb MCP (0-60)  Thumb IP (0-80)     Thumb Radial Abduction Span     Thumb Palmar Abduction Span     (Blank rows = not tested)   UPPER EXTREMITY MMT:    Eval:  NT at eval due to recent and still healing injuries. Will be tested when appropriate.   MMT Right TBD  Shoulder flexion   Shoulder abduction   Shoulder adduction   Shoulder extension   Shoulder internal rotation   Shoulder external rotation   Middle trapezius   Lower trapezius   Elbow flexion   Elbow extension   Forearm supination   Forearm pronation   Wrist flexion   Wrist extension   Wrist ulnar deviation   Wrist radial deviation   (Blank rows = not tested)  HAND FUNCTION: Eval: Observed weakness in affected right hand.  Will be tested when appropriate Grip strength Right: TBD lbs, Left: TBD lbs   COORDINATION: 01/20/23: 9 Hole Peg Test Right: 38 sec (33 sec is WFL)   Eval: Observed coordination impairments with affected right hand.  Will be tested when able   SENSATION: Eval:  Light touch intact  today, though diminished around sx area    EDEMA:   Eval:  Mildly swollen in right hand and wrist today  OBSERVATIONS:   Eval: Typical dorsal swelling near the surgery site, no overt signs of infection otherwise, pain is low and he seems understanding with decent body awareness.  Very stiff wrist and fingers for now   TODAY'S TREATMENT:  01/27/23: He starts with active range of motion for review of exercises as well as new measures which shows some small improvements, but also he seems limited by pain at the base of the thumb near his surgical area.  To attempt to help with this, OT does manual therapy vibration and negative pressure therapy (cupping) on the scar area.  He finds this very relieving and states he "does not even think he had a surgery anymore."  He is very happy and states his motion has increased and in fact it has when remeasured.  This speaks to the nature of his pain as a nerve pain.  He was given information on these techniques for home use.  Next, he was given other neuromuscular reeducation exercises for proprioceptive activities including gently bouncing and catching a ball, wrist circumduction and thoughtful activity for "letter writing," also rolling a ball on a plate with careful control coordination and proprioception.  He states these things are not painful but they are difficult.  Additionally his HEP was reviewed and now he can tolerate wrist and thumb stretches better now.  He was advised again to not "push or force" his wrist to move but he can put more emphasis through his fingers.  Again he was given education that he may not be totally happy with his surgery until 6 to 12 months postop, and that he needs to avoid pushing himself too quickly.  At the end of the session he states he is very happy with the pain relief he got today.  PATIENT EDUCATION: Education details: See tx section above for details  Person educated: Patient Education method: Verbal Instruction,  Teach back, Handouts  Education comprehension: States and demonstrates understanding, Additional Education required    HOME EXERCISE PROGRAM: Access Code: [email or web address] URL: [email or web address] Date: 01/10/2023 Prepared by: Fannie Knee   GOALS: Goals reviewed with patient? Yes   SHORT TERM GOALS: (STG required if POC>30 days) Target Date: 01/20/2023  Pt will obtain protective,  custom orthotic. Goal status: MET   2.  Pt will demo/state understanding of initial HEP to improve pain levels and prerequisite motion. Goal status: 01/20/23: MET   LONG TERM GOALS: Target Date: 02/24/23  Pt will improve functional ability by decreased impairment per Quick DASH assessment from 38% to 15% or better, for better quality of life. Goal status: INITIAL-pending baseline measure next session  2.  Pt will improve grip strength in right hand from unsafe to test at evaluation to at least 40 lbs for functional use at home and in IADLs. Goal status: INITIAL  3.  Pt will improve A/ROM in right wrist flexion/extension from 31/5 to at least 40 degrees each, to have functional motion for tasks like reach and grasp.  Goal status: INITIAL  4.  Pt will improve strength in right wrist flexion/extension from unsafe to test at evaluation to at least 4+/5 MMT to have increased functional ability to carry out selfcare and higher-level homecare tasks with less difficulty. Goal status: INITIAL  5.  Pt will improve coordination skills in right hand, as seen by within functional limit score on nine-hole peg testing to have increased functional ability to carry out fine motor tasks (fasteners, etc.) and more complex, coordinated IADLs (meal prep, sports, etc.).  Goal status: INITIAL  ASSESSMENT:  CLINICAL IMPRESSION: 01/27/23: Manual therapy was greatly helpful to him to help with nerve pain.  His motion improved significantly afterwards.  We will do this again as needed, we will also work on gentle  strengthening starting at 8 weeks postop.    PLAN:  OT FREQUENCY: 1-2x/week  OT DURATION: 6 weeks through 02/24/2023 and up to 10 total visits as necessary  PLANNED INTERVENTIONS: 97168 OT Re-evaluation, 97535 self care/ADL training, 16109 therapeutic exercise, 97530 therapeutic activity, 97112 neuromuscular re-education, 97140 manual therapy, 97035 ultrasound, 97039 fluidotherapy, 97010 moist heat, 97010 cryotherapy, 97034 contrast bath, 97032 electrical stimulation (manual), 97760 Orthotics management and training, 60454 Splinting (initial encounter), [id number] Subsequent splinting/medication, scar mobilization, passive range of motion, compression bandaging, Dry needling, coping strategies training, and patient/family education  CONSULTED AND AGREED WITH PLAN OF CARE: Patient  PLAN FOR NEXT SESSION:   Continue on with manual therapy as helpful, can start strengthening at 8 weeks postop, no forced motion at the wrist but can get more aggressive in the hand as helpful.   Fannie Knee, OTR/L, CHT 01/27/2023, 12:36 PM

## 2023-01-27 ENCOUNTER — Ambulatory Visit (INDEPENDENT_AMBULATORY_CARE_PROVIDER_SITE_OTHER): Payer: PPO | Admitting: Rehabilitative and Restorative Service Providers"

## 2023-01-27 ENCOUNTER — Encounter: Payer: Self-pay | Admitting: Rehabilitative and Restorative Service Providers"

## 2023-01-27 DIAGNOSIS — R278 Other lack of coordination: Secondary | ICD-10-CM | POA: Diagnosis not present

## 2023-01-27 DIAGNOSIS — R600 Localized edema: Secondary | ICD-10-CM | POA: Diagnosis not present

## 2023-01-27 DIAGNOSIS — M25641 Stiffness of right hand, not elsewhere classified: Secondary | ICD-10-CM

## 2023-01-27 DIAGNOSIS — M6281 Muscle weakness (generalized): Secondary | ICD-10-CM

## 2023-01-27 DIAGNOSIS — M25531 Pain in right wrist: Secondary | ICD-10-CM | POA: Diagnosis not present

## 2023-01-27 DIAGNOSIS — M25631 Stiffness of right wrist, not elsewhere classified: Secondary | ICD-10-CM | POA: Diagnosis not present

## 2023-02-01 NOTE — Therapy (Incomplete)
OUTPATIENT OCCUPATIONAL THERAPY TREATMENT NOTE  Patient Name: [name] MRN: [id number] DOB:January 18, 1950, 73 y.o., male Today's Date: 02/01/2023  PCP: Rodney Langton, MD REFERRING PROVIDER: Samuella Cota, MD   END OF SESSION:      Past Medical History:  Diagnosis Date   ADD (attention deficit disorder)    Anxiety    Arthritis 2016   bilateral hands   Cancer (HCC)    PROSTATE   Diabetes mellitus without complication (HCC)    Diverticulitis    Hyperlipidemia    Hypertension    Pain    HX OF 3 BACK SURGERIES - STILL HAS SEVERE BACK PAIN WITH PROLONGED SITTING OR STANDING   PONV (postoperative nausea and vomiting)    NAUSEA AFTER SHOULDER SURGERY   Reflux    MILD - NO DAILY MEDS - ANTIACID IF NEEDED   Past Surgical History:  Procedure Laterality Date   ALLOGRAFT APPLICATION Right 12/06/2022   Procedure: POSSIBLE ALLOGRAFT SPACER;  Surgeon: Samuella Cota, MD;  Location: [facility];  Service: Orthopedics;  Laterality: Right;   BACK SURGERY     X3  1993, 1998, 2007   CARPECTOMY Right 12/06/2022   Procedure: RIGHT WRIST PROXIMAL ROW CARPECTOMY / THUMB CMC DENERVATION;  Surgeon: Samuella Cota, MD;  Location: [facility];  Service: Orthopedics;  Laterality: Right;   COLON SURGERY  2008   COLON RESECTION FOR DIVERTICULITIS   HERNIA REPAIR  1997 OR 1998   ING HERNIA REPAIR   RIGHT SHOULDER 2013     ROBOT ASSISTED LAPAROSCOPIC RADICAL PROSTATECTOMY N/A 02/13/2013   Procedure: ROBOTIC ASSISTED LAPAROSCOPIC RADICAL PROSTATECTOMY,, LYSIS OF PELVIC / INTRA-ABDOMINAL ADHESIONS;  Surgeon: Valetta Fuller, MD;  Location: [facility];  Service: Urology;  Laterality: N/A;   Patient Active Problem List   Diagnosis Date Noted   Slac (scapholunate advanced collapse) of wrist, right 12/06/2022   Sinobronchitis 05/31/2022   Chalazion of left eye 04/20/2022   Hypnagogic hallucinations 04/05/2022   Primary osteoarthritis of first carpometacarpal  joint of right hand 09/03/2021   Primary osteoarthritis of right knee 07/02/2021   Seborrheic dermatitis 03/03/2021   Tinea cruris 07/06/2020   Primary insomnia 06/03/2020   Substance or medication-induced sleep disorder, insomnia type (HCC) 03/20/2020   Chronic eczematous otitis externa of left ear 03/19/2020   Rotator cuff dysfunction, right 06/03/2019   Controlled type 2 diabetes mellitus without complication, without long-term current use of insulin (HCC) 03/15/2019   DDD (degenerative disc disease), cervical 03/06/2018   Erythro-telangiectatic rosacea 08/25/2017   Trigger ring finger of right hand 03/31/2017   Hyperlipidemia, mixed 03/08/2017   Malignant melanoma, left posterior shoulder 09/05/2016   Generalized anxiety with depression 01/01/2016   Postlaminectomy syndrome, lumbar region 11/16/2015   Bilateral primary osteoarthritis of first carpometacarpal joints 11/16/2015   Post-surgical erectile dysfunction 11/16/2015   Diverticulosis 02/13/2015   Benign paroxysmal positional vertigo 02/02/2015   Annual physical exam 01/29/2015   Essential hypertension, benign 01/29/2015   Trigger middle finger of right hand 01/14/2015   Primary osteoarthritis, right second MCP 01/10/2014   Malignant neoplasm of prostate (HCC) 02/13/2013    ONSET DATE: DOS 12/06/22  REFERRING DIAG: O96.295 (ICD-10-CM) - Status post proximal row carpectomy of wrist   THERAPY DIAG:  No diagnosis found.  Rationale for Evaluation and Treatment: Rehabilitation  PERTINENT HISTORY: Per referral: "Splint fabrication/ early ROM Right wrist proximal row carpectomy/thumb cmc denervation"  He states doing well so far after surgery, not having any significant pain but just some stiff  and sore feeling.  He has a bulky dressing on.  He states being retired but enjoying a robust and healthy active lifestyle which is now on "pause" after the surgery.  He does state before the surgery he could barely tolerate shaking  someone's hand due to the pain and stiffness in his wrist.   PRECAUTIONS: None;  RED FLAGS: None   WEIGHT BEARING RESTRICTIONS: Yes nonweightbearing in right wrist now   SUBJECTIVE:   SUBJECTIVE STATEMENT: 8 weeks post-op Rt prox row carpectomy now.  He states ***   not feeling like he's made much progress and his pinching is weak. He was reminded that he shouldn't be strengthening yet, and that he may not feel "great" for up to 1 year.     PAIN:  Are you having pain?  *** 0/10 now at rest but he does have pain with movement and exercises  PATIENT GOALS: To improve the use of his right nondominant arm  NEXT MD VISIT: 02/06/2023    OBJECTIVE: (All objective assessments below are from initial evaluation on: 01/10/23 unless otherwise specified.)   HAND DOMINANCE: Left  ADLs: Overall ADLs: States decreased ability to grab, hold household objects, pain and difficulty to open containers, perform FMS tasks (manipulate fasteners on clothing), mild bathing problems as well.    FUNCTIONAL OUTCOME MEASURES: 01/20/23: Quick DASH 38.6 % impairment today  (Higher % Score  =  More Impairment)     UPPER EXTREMITY ROM     Shoulder to Wrist AROM Right eval Rt 01/19/13 Rt 01/27/23 Rt 02/03/23  Forearm supination 49 68    Forearm pronation  88 90    Wrist flexion 31 40 43   Wrist extension 5 19 15    Wrist ulnar deviation 20 28 17  (22* after manual therapy ***  Wrist radial deviation (-2) 0 1 (7* after manual therapy)    Functional dart thrower's motion (F-DTM) in ulnar flexion      F-DTM in radial extension       (Blank rows = not tested)   Hand AROM Right eval Rt 01/20/23 Rt 01/27/23  Full Fist Ability (or Gap to Distal Palmar Crease) 3cm gap from MF tip to [facility] Loose full fist   Thumb Opposition  (Kapandji Scale)  4/10 5.5/10 6/10  Thumb MCP (0-60)     Thumb IP (0-80)     Thumb Radial Abduction Span     Thumb Palmar Abduction Span     (Blank rows = not tested)   UPPER  EXTREMITY MMT:    Eval:  NT at eval due to recent and still healing injuries. Will be tested when appropriate.   MMT Right TBD  Shoulder flexion   Shoulder abduction   Shoulder adduction   Shoulder extension   Shoulder internal rotation   Shoulder external rotation   Middle trapezius   Lower trapezius   Elbow flexion   Elbow extension   Forearm supination   Forearm pronation   Wrist flexion   Wrist extension   Wrist ulnar deviation   Wrist radial deviation   (Blank rows = not tested)  HAND FUNCTION: Eval: Observed weakness in affected right hand.  Will be tested when appropriate Grip strength Right: TBD lbs, Left: TBD lbs   COORDINATION: 01/20/23: 9 Hole Peg Test Right: 38 sec (33 sec is WFL)   Eval: Observed coordination impairments with affected right hand.  Will be tested when able   SENSATION: Eval:  Light touch intact today, though diminished around sx  area    EDEMA:   Eval:  Mildly swollen in right hand and wrist today  OBSERVATIONS:   Eval: Typical dorsal swelling near the surgery site, no overt signs of infection otherwise, pain is low and he seems understanding with decent body awareness.  Very stiff wrist and fingers for now   TODAY'S TREATMENT:  02/03/23: ***  Continue on with manual therapy as helpful, can start strengthening at 8 weeks postop, no forced motion at the wrist but can get more aggressive in the hand as helpful.    01/27/23: He starts with active range of motion for review of exercises as well as new measures which shows some small improvements, but also he seems limited by pain at the base of the thumb near his surgical area.  To attempt to help with this, OT does manual therapy vibration and negative pressure therapy (cupping) on the scar area.  He finds this very relieving and states he "does not even think he had a surgery anymore."  He is very happy and states his motion has increased and in fact it has when remeasured.  This speaks to the  nature of his pain as a nerve pain.  He was given information on these techniques for home use.  Next, he was given other neuromuscular reeducation exercises for proprioceptive activities including gently bouncing and catching a ball, wrist circumduction and thoughtful activity for "letter writing," also rolling a ball on a plate with careful control coordination and proprioception.  He states these things are not painful but they are difficult.  Additionally his HEP was reviewed and now he can tolerate wrist and thumb stretches better now.  He was advised again to not "push or force" his wrist to move but he can put more emphasis through his fingers.  Again he was given education that he may not be totally happy with his surgery until 6 to 12 months postop, and that he needs to avoid pushing himself too quickly.  At the end of the session he states he is very happy with the pain relief he got today.  PATIENT EDUCATION: Education details: See tx section above for details  Person educated: Patient Education method: Verbal Instruction, Teach back, Handouts  Education comprehension: States and demonstrates understanding, Additional Education required    HOME EXERCISE PROGRAM: Access Code: [email or web address] URL: [email or web address] Date: 01/10/2023 Prepared by: Fannie Knee   GOALS: Goals reviewed with patient? Yes   SHORT TERM GOALS: (STG required if POC>30 days) Target Date: 01/20/2023  Pt will obtain protective, custom orthotic. Goal status: MET   2.  Pt will demo/state understanding of initial HEP to improve pain levels and prerequisite motion. Goal status: 01/20/23: MET   LONG TERM GOALS: Target Date: 02/24/23  Pt will improve functional ability by decreased impairment per Quick DASH assessment from 38% to 15% or better, for better quality of life. Goal status: INITIAL-pending baseline measure next session  2.  Pt will improve grip strength in right hand from unsafe  to test at evaluation to at least 40 lbs for functional use at home and in IADLs. Goal status: INITIAL  3.  Pt will improve A/ROM in right wrist flexion/extension from 31/5 to at least 40 degrees each, to have functional motion for tasks like reach and grasp.  Goal status: INITIAL  4.  Pt will improve strength in right wrist flexion/extension from unsafe to test at evaluation to at least 4+/5 MMT to have increased functional ability to carry out selfcare and  higher-level homecare tasks with less difficulty. Goal status: INITIAL  5.  Pt will improve coordination skills in right hand, as seen by within functional limit score on nine-hole peg testing to have increased functional ability to carry out fine motor tasks (fasteners, etc.) and more complex, coordinated IADLs (meal prep, sports, etc.).  Goal status: INITIAL  ASSESSMENT:  CLINICAL IMPRESSION: 02/03/23: ***  01/27/23: Manual therapy was greatly helpful to him to help with nerve pain.  His motion improved significantly afterwards.  We will do this again as needed, we will also work on gentle strengthening starting at 8 weeks postop.    PLAN:  OT FREQUENCY: 1-2x/week  OT DURATION: 6 weeks through 02/24/2023 and up to 10 total visits as necessary  PLANNED INTERVENTIONS: 97168 OT Re-evaluation, 97535 self care/ADL training, 69629 therapeutic exercise, 97530 therapeutic activity, 97112 neuromuscular re-education, 97140 manual therapy, 97035 ultrasound, 97039 fluidotherapy, 97010 moist heat, 97010 cryotherapy, 97034 contrast bath, 97032 electrical stimulation (manual), 97760 Orthotics management and training, 52841 Splinting (initial encounter), [id number] Subsequent splinting/medication, scar mobilization, passive range of motion, compression bandaging, Dry needling, coping strategies training, and patient/family education  CONSULTED AND AGREED WITH PLAN OF CARE: Patient  PLAN FOR NEXT SESSION:   ***   Fannie Knee, OTR/L,  CHT 02/01/2023, 5:31 PM

## 2023-02-02 ENCOUNTER — Other Ambulatory Visit: Payer: Self-pay | Admitting: Sports Medicine

## 2023-02-02 DIAGNOSIS — E782 Mixed hyperlipidemia: Secondary | ICD-10-CM

## 2023-02-03 ENCOUNTER — Encounter: Payer: PPO | Admitting: Rehabilitative and Restorative Service Providers"

## 2023-02-06 ENCOUNTER — Ambulatory Visit (INDEPENDENT_AMBULATORY_CARE_PROVIDER_SITE_OTHER): Payer: PPO | Admitting: Orthopedic Surgery

## 2023-02-06 DIAGNOSIS — Z9889 Other specified postprocedural states: Secondary | ICD-10-CM

## 2023-02-06 NOTE — Progress Notes (Signed)
   [name] - 73 y.o. male MRN [id number]  Date of birth: [birth year]  Office Visit Note: Visit Date: 02/06/2023 PCP: Monica Becton, MD Referred by: Monica Becton,*  Subjective:  HPI: [name] is a 73 y.o. male who presents today for follow up 9 weeks status post right wrist proximal row carpectomy/thumb cmc denervation.  He is doing very well and progressing with therapy as instructed.  Pertinent ROS were reviewed with the patient and found to be negative unless otherwise specified above in HPI.   Assessment & Plan: Visit Diagnoses:  No diagnosis found.   Plan: He is doing well postoperatively.  Continue occupational therapy with transition to home exercise program as needed.  I will plan on seeing him back in approximately 6 weeks time or as needed moving forward.  Follow-up: No follow-ups on file.   Meds & Orders: No orders of the defined types were placed in this encounter.   No orders of the defined types were placed in this encounter.    Procedures: No procedures performed       Objective:   Vital Signs: There were no vitals taken for this visit.  Ortho Exam Right wrist: - Mild swelling diffusely at the wrist - Digital range of motion is preserved index through small finger - Thumb opposition to the small finger DIP - Sensation is intact distally, hand is warm well-perfused - Able to perform wrist range of motion flexion/extension without significant pain - Thumb circumduction without significant pain  Imaging: No results found.    [name], M.D. [facility] 3:07 PM

## 2023-02-09 ENCOUNTER — Encounter: Payer: PPO | Admitting: Rehabilitative and Restorative Service Providers"

## 2023-04-24 ENCOUNTER — Encounter (INDEPENDENT_AMBULATORY_CARE_PROVIDER_SITE_OTHER): Payer: Self-pay | Admitting: Sports Medicine

## 2023-04-24 ENCOUNTER — Other Ambulatory Visit: Payer: Self-pay | Admitting: Sports Medicine

## 2023-04-24 DIAGNOSIS — K5790 Diverticulosis of intestine, part unspecified, without perforation or abscess without bleeding: Secondary | ICD-10-CM | POA: Diagnosis not present

## 2023-04-24 DIAGNOSIS — I10 Essential (primary) hypertension: Secondary | ICD-10-CM

## 2023-04-25 MED ORDER — CIPROFLOXACIN HCL 750 MG PO TABS: 750.0000 mg | ORAL_TABLET | Freq: Two times a day (BID) | ORAL | 0 refills | Status: AC

## 2023-04-25 MED ORDER — METRONIDAZOLE 500 MG PO TABS
500.0000 mg | ORAL_TABLET | Freq: Two times a day (BID) | ORAL | 0 refills | Status: AC
Start: 2023-04-25 — End: ?

## 2023-04-25 NOTE — Telephone Encounter (Signed)

## 2023-04-26 ENCOUNTER — Encounter: Payer: Self-pay | Admitting: Sports Medicine

## 2023-05-02 ENCOUNTER — Ambulatory Visit (INDEPENDENT_AMBULATORY_CARE_PROVIDER_SITE_OTHER): Admitting: Sports Medicine

## 2023-05-02 VITALS — BP 126/86 | HR 85 | Ht 67.0 in | Wt 159.0 lb

## 2023-05-02 DIAGNOSIS — E119 Type 2 diabetes mellitus without complications: Secondary | ICD-10-CM | POA: Diagnosis not present

## 2023-05-02 DIAGNOSIS — K5790 Diverticulosis of intestine, part unspecified, without perforation or abscess without bleeding: Secondary | ICD-10-CM | POA: Diagnosis not present

## 2023-05-02 LAB — POCT GLYCOSYLATED HEMOGLOBIN (HGB A1C): HbA1c, POC (controlled diabetic range): 5.9 % (ref 0.0–7.0)

## 2023-05-02 MED ORDER — SENNA-DOCUSATE SODIUM 8.6-50 MG PO TABS: 1.0000 | ORAL_TABLET | Freq: Every day | ORAL | Status: AC

## 2023-05-02 NOTE — Assessment & Plan Note (Signed)
 A1c well-controlled 5.9% down from 6.2% at the last visit. He has lost a good amount of weight, continues with dietary changes, adding some of the other routine screenings, he can return in 6 months to a year for this.

## 2023-05-02 NOTE — Assessment & Plan Note (Addendum)
 This is a very pleasant 73 year old male, he has a history of diverticulosis, he had an episode of diverticulitis that did necessitate sigmoidectomy, he would get occasional episodes of lower abdominal pain, left-sided. He does endorse stools might be a bit hard in between episodes of diverticulitis. He sent me a message earlier and we added Cipro  and Flagyl , he has improved dramatically. He will do Senokot-S once every day or every other day to keep his stools soft, continue a high-fiber diet and return to see me as needed for this.

## 2023-05-02 NOTE — Progress Notes (Signed)
    Procedures performed today:    None.  Independent interpretation of notes and tests performed by another provider:   None.  Brief History, Exam, Impression, and Recommendations:    Diverticulosis This is a very pleasant 73 year old male, he has a history of diverticulosis, he had an episode of diverticulitis that did necessitate sigmoidectomy, he would get occasional episodes of lower abdominal pain, left-sided. He does endorse stools might be a bit hard in between episodes of diverticulitis. He sent me a message earlier and we added Cipro  and Flagyl , he has improved dramatically. He will do Senokot-S once every day or every other day to keep his stools soft, continue a high-fiber diet and return to see me as needed for this.  Controlled type 2 diabetes mellitus without complication, without long-term current use of insulin (HCC) A1c well-controlled 5.9% down from 6.2% at the last visit. He has lost a good amount of weight, continues with dietary changes, adding some of the other routine screenings, he can return in 6 months to a year for this.    ____________________________________________ Joselyn Nicely. Sandy Crumb, M.D., ABFM., CAQSM., AME. Primary Care and Sports Medicine [facility]  Adjunct Professor of [facility] Medicine  [employer]  Restaurant manager, fast food

## 2023-05-03 DIAGNOSIS — E119 Type 2 diabetes mellitus without complications: Secondary | ICD-10-CM | POA: Diagnosis not present

## 2023-05-04 LAB — MICROALBUMIN / CREATININE URINE RATIO
Creatinine, Urine: 152.7 mg/dL
Microalb/Creat Ratio: 2 mg/g{creat} (ref 0–29)
Microalbumin, Urine: 3.2 ug/mL

## 2023-05-04 LAB — CBC
Hematocrit: 42.5 % (ref 37.5–51.0)
Hemoglobin: 13.9 g/dL (ref 13.0–17.7)
MCH: 29 pg (ref 26.6–33.0)
MCHC: 32.7 g/dL (ref 31.5–35.7)
MCV: 89 fL (ref 79–97)
Platelets: 320 10*3/uL (ref 150–450)
RBC: 4.8 x10E6/uL (ref 4.14–5.80)
RDW: 14.3 % (ref 11.6–15.4)
WBC: 6.9 10*3/uL (ref 3.4–10.8)

## 2023-05-04 LAB — COMPREHENSIVE METABOLIC PANEL WITH GFR
ALT: 22 IU/L (ref 0–44)
AST: 28 IU/L (ref 0–40)
Albumin: 4.6 g/dL (ref 3.8–4.8)
Alkaline Phosphatase: 45 IU/L (ref 44–121)
BUN/Creatinine Ratio: 13 (ref 10–24)
BUN: 15 mg/dL (ref 8–27)
Bilirubin Total: 0.3 mg/dL (ref 0.0–1.2)
CO2: 22 mmol/L (ref 20–29)
Calcium: 9.2 mg/dL (ref 8.6–10.2)
Chloride: 102 mmol/L (ref 96–106)
Creatinine, Ser: 1.12 mg/dL (ref 0.76–1.27)
Globulin, Total: 2.1 g/dL (ref 1.5–4.5)
Glucose: 105 mg/dL — ABNORMAL HIGH (ref 70–99)
Potassium: 5.1 mmol/L (ref 3.5–5.2)
Sodium: 139 mmol/L (ref 134–144)
Total Protein: 6.7 g/dL (ref 6.0–8.5)
eGFR: 69 mL/min/{1.73_m2} (ref >59)

## 2023-05-04 LAB — LIPID PANEL
Chol/HDL Ratio: 2.7 ratio (ref 0.0–5.0)
Cholesterol, Total: 139 mg/dL (ref 100–199)
HDL: 51 mg/dL
LDL Chol Calc (NIH): 77 mg/dL (ref 0–99)
Triglycerides: 50 mg/dL (ref 0–149)
VLDL Cholesterol Cal: 11 mg/dL (ref 5–40)

## 2023-05-04 LAB — TSH: TSH: 3.36 u[IU]/mL (ref 0.450–4.500)

## 2023-05-08 ENCOUNTER — Encounter: Payer: Self-pay | Admitting: Sports Medicine

## 2023-05-08 ENCOUNTER — Ambulatory Visit: Payer: PPO | Admitting: Sports Medicine

## 2023-05-08 MED ORDER — IBUPROFEN 800 MG PO TABS: 800.0000 mg | ORAL_TABLET | Freq: Three times a day (TID) | ORAL | 3 refills | Status: DC | PRN

## 2023-05-23 ENCOUNTER — Encounter: Payer: Self-pay | Admitting: Sports Medicine

## 2023-06-21 ENCOUNTER — Encounter: Payer: Self-pay | Admitting: Orthopaedic Surgery

## 2023-06-29 ENCOUNTER — Ambulatory Visit: Payer: Self-pay | Admitting: Sports Medicine

## 2023-06-29 NOTE — Telephone Encounter (Signed)
 FYI - the patient is scheduled to see the provider on 07/03/23 at 915 am.

## 2023-06-29 NOTE — Telephone Encounter (Signed)
 FYI Only or Action Required?: Action required by provider: request for appointment.  Patient was last seen in primary care on 05/02/2023 by Gean Keels, MD. Called Nurse Triage reporting Back Pain. Symptoms began a week ago. Interventions attempted: OTC medications: ibuprofen  800 mg. Symptoms are: unchanged.  Triage Disposition: See HCP Within 4 Hours (Or PCP Triage)  Patient/caregiver understands and will follow disposition?: Yes                     Copied from CRM [id number]. Topic: Clinical - Red Word Triage >> Jun 29, 2023  9:52 AM Kevelyn M wrote: Red Word that prompted transfer to Nurse Triage: Lower right hand side of back is in pain Reason for Disposition  [1] SEVERE back pain (e.g., excruciating, unable to do any normal activities) AND [2] not improved 2 hours after pain medicine  Answer Assessment - Initial Assessment Questions This RN advised patient to go to an urgent care as no appointment availability in office. Patient scheduled for first available appointment with Dr. Elva Hamburger on Monday, 6/23, but this RN advised pt to go to urgent care in meantime. Pt agreeable. Pt would like to see if he can be squeezed into schedule to see Dr. Elva Hamburger today.   Right lower back pain started a little over a week ago 3 lumbar back surgeries, attempted to contact surgeon but can't be seen until July Patient felt something pop Can walk and stand okay Pain when sitting 11/10 pain level, feels like a knife that goes down right leg  Protocols used: Back Pain-A-AH

## 2023-07-03 ENCOUNTER — Ambulatory Visit (INDEPENDENT_AMBULATORY_CARE_PROVIDER_SITE_OTHER): Admitting: Sports Medicine

## 2023-07-03 ENCOUNTER — Encounter: Payer: Self-pay | Admitting: Sports Medicine

## 2023-07-03 DIAGNOSIS — M961 Postlaminectomy syndrome, not elsewhere classified: Secondary | ICD-10-CM

## 2023-07-03 DIAGNOSIS — G250 Essential tremor: Secondary | ICD-10-CM | POA: Insufficient documentation

## 2023-07-03 MED ORDER — PREDNISONE 50 MG PO TABS: ORAL_TABLET | ORAL | 0 refills | Status: DC

## 2023-07-03 NOTE — Assessment & Plan Note (Signed)
 Noted on intention, no resting tremor. Primidone is an option, he will think about it and read about it.

## 2023-07-03 NOTE — Progress Notes (Signed)
    Procedures performed today:    None.  Independent interpretation of notes and tests performed by another provider:   None.  Brief History, Exam, Impression, and Recommendations:    Postlaminectomy syndrome, lumbar region Very pleasant 73 year old male, he has been working with Dr. Joshua, he is status post laminectomy with persistent pain. He had a elective L4 nerve root block with Dr. Bonner in 2017 that was highly effective. MRI in 2023 showed multilevel disc and facet disease worst L4-L5 with biforaminal stenosis. He was doing well until about 10 days ago, he was doing some work in the yard, increasing pain right sided axial low back with radiation down to the buttock and posterior thigh but not past the knee. Pain is worse with sitting, flexion Valsalva. No discrete areas of tenderness, strength is normal distally, no red flag symptoms such as bowel or bladder dysfunction. Suspect more of a discogenic pain generator, potential annular tear, adding 5 days of prednisone , patient does not desire any constipating medications such as narcotics or muscle relaxers for now. Adding some home core conditioning with planks, etc. He does have an appointment coming with Dr. Joshua in about a month.   Benign essential tremor Noted on intention, no resting tremor. Primidone is an option, he will think about it and read about it.    ____________________________________________ Debby PARAS. Curtis, M.D., ABFM., CAQSM., AME. Primary Care and Sports Medicine [facility]  Adjunct Professor of [facility] Medicine  [employer]  Restaurant manager, fast food

## 2023-07-03 NOTE — Assessment & Plan Note (Signed)
 Very pleasant 73 year old male, he has been working with Dr. Joshua, he is status post laminectomy with persistent pain. He had a elective L4 nerve root block with Dr. Bonner in 2017 that was highly effective. MRI in 2023 showed multilevel disc and facet disease worst L4-L5 with biforaminal stenosis. He was doing well until about 10 days ago, he was doing some work in the yard, increasing pain right sided axial low back with radiation down to the buttock and posterior thigh but not past the knee. Pain is worse with sitting, flexion Valsalva. No discrete areas of tenderness, strength is normal distally, no red flag symptoms such as bowel or bladder dysfunction. Suspect more of a discogenic pain generator, potential annular tear, adding 5 days of prednisone , patient does not desire any constipating medications such as narcotics or muscle relaxers for now. Adding some home core conditioning with planks, etc. He does have an appointment coming with Dr. Joshua in about a month.

## 2023-07-05 ENCOUNTER — Other Ambulatory Visit (INDEPENDENT_AMBULATORY_CARE_PROVIDER_SITE_OTHER): Payer: Self-pay

## 2023-07-05 ENCOUNTER — Ambulatory Visit: Admitting: Orthopedic Surgery

## 2023-07-05 DIAGNOSIS — M1811 Unilateral primary osteoarthritis of first carpometacarpal joint, right hand: Secondary | ICD-10-CM | POA: Diagnosis not present

## 2023-07-05 DIAGNOSIS — Z9889 Other specified postprocedural states: Secondary | ICD-10-CM | POA: Diagnosis not present

## 2023-07-05 MED ORDER — BETAMETHASONE SOD PHOS & ACET 6 (3-3) MG/ML IJ SUSP
6.0000 mg | INTRAMUSCULAR | Status: AC | PRN
  Administered 2023-07-05: 6 mg via INTRA_ARTICULAR

## 2023-07-05 MED ORDER — LIDOCAINE HCL 1 % IJ SOLN
1.0000 mL | INTRAMUSCULAR | Status: AC | PRN
  Administered 2023-07-05: 1 mL

## 2023-07-05 NOTE — Progress Notes (Signed)
 [name] - 73 y.o. male MRN [id number]  Date of birth: 03/09/1950  Office Visit Note: Visit Date: 07/05/2023 PCP: Curtis Debby PARAS, MD Referred by: Curtis Debby PARAS,*  Subjective:  HPI: [name] is a 73 y.o. male who presents today for follow up 7 months status post right wrist proximal row carpectomy/thumb cmc denervation.  He states his range of motion of the wrist and function have improved however he continues to have some basilar joint pain at the thumb region.  Pertinent ROS were reviewed with the patient and found to be negative unless otherwise specified above in HPI.   Assessment & Plan: Visit Diagnoses:  1. Primary osteoarthritis of first carpometacarpal joint of right hand   2. Status post proximal row carpectomy of wrist     Plan: Extensive discussion was had with the patient today regarding his right wrist.  Repeat x-rays were reviewed today which show stable appearance of the wrist status post proximal row carpectomy, the capitate is well located within the lunate facets of the distal radius.  I did explain however that the ongoing [facility] arthritis does correlate with his ongoing clinical examination, there is also some impaction at the radius-trapezial region given the previous scaphoidectomy.  I explained that despite prior denervation of the right thumb CMC, there does remain a mechanical issue of the thumb metacarpal impacting into the trapezium as well as some likely impaction of the trapezium into the distal radius.  For clinical therapeutic and diagnostic purposes, I have recommended that we perform right thumb CMC cortisone injection under ultrasound guidance today.  This will help determine if potential trapeziectomy and internal brace of the right thumb may help mitigate symptoms in the future.  He did explain that he has upcoming back surgery in the near future.  He is seeing the neurosurgeon next month.  I recommended he return to me after  being seen by the neurosurgeon or discuss appropriate next steps from a hand surgical standpoint.  He expressed full understanding.  I spent 35 minutes in the care of this patient today including review of previous documentation, imaging obtained, face-to-face time discussing all options regarding treatment and documenting the encounter.   Follow-up: No follow-ups on file.   Meds & Orders: No orders of the defined types were placed in this encounter.   Orders Placed This Encounter  Procedures   Hand/UE Inj   XR Wrist Complete Right     Procedures: Hand/UE Inj: R thumb CMC for osteoarthritis on 07/05/2023 3:20 PM Indications: pain Details: 25 G needle, ultrasound-guided Medications: 1 mL lidocaine  1 %; 6 mg betamethasone  acetate-betamethasone  sodium phosphate  6 (3-3) MG/ML Outcome: tolerated well, no immediate complications  Images saved in the [facility] ultrasound database Procedure, treatment alternatives, risks and benefits explained, specific risks discussed.          Objective:   Vital Signs: There were no vitals taken for this visit.  Ortho Exam Right wrist: - Mild swelling diffusely at the wrist - Digital range of motion is preserved index through small finger - Thumb opposition to the small finger DIP - Sensation is intact distally, hand is warm well-perfused - Able to perform wrist range of motion flexion/extension without significant pain - Thumb circumduction without significant pain - Tenderness elicited over the distal radius region along the radial border, likely at the impaction region of the trapezium at the distal radial styloid  Imaging: XR Wrist Complete Right Result Date: 07/05/2023 X-rays of the right wrist demonstrate  prior proximal row carpectomy, capitate is well located within the lunate facet of the distal radius.  Notable thumb subsidence of the trapezium towards the radial styloid region with concern for impaction.  Significant degenerative  changes at the thumb CMC interval with joint space narrowing, osteophyte formation and subchondral sclerosis.    Advay Volante Afton Alderton, M.D. [facility], Hand Surgery

## 2023-07-06 ENCOUNTER — Encounter: Payer: Self-pay | Admitting: Orthopedic Surgery

## 2023-07-17 ENCOUNTER — Encounter: Payer: Self-pay | Admitting: Sports Medicine

## 2023-07-19 DIAGNOSIS — H02822 Cysts of right lower eyelid: Secondary | ICD-10-CM | POA: Diagnosis not present

## 2023-07-27 DIAGNOSIS — M5416 Radiculopathy, lumbar region: Secondary | ICD-10-CM | POA: Diagnosis not present

## 2023-08-02 ENCOUNTER — Encounter: Payer: Self-pay | Admitting: Gastroenterology

## 2023-08-07 DIAGNOSIS — E113293 Type 2 diabetes mellitus with mild nonproliferative diabetic retinopathy without macular edema, bilateral: Secondary | ICD-10-CM | POA: Diagnosis not present

## 2023-08-07 DIAGNOSIS — Z7984 Long term (current) use of oral hypoglycemic drugs: Secondary | ICD-10-CM | POA: Diagnosis not present

## 2023-08-07 DIAGNOSIS — H2513 Age-related nuclear cataract, bilateral: Secondary | ICD-10-CM | POA: Diagnosis not present

## 2023-08-07 LAB — HM DIABETES EYE EXAM

## 2023-08-13 ENCOUNTER — Encounter: Payer: Self-pay | Admitting: Sports Medicine

## 2023-08-13 ENCOUNTER — Other Ambulatory Visit: Payer: Self-pay | Admitting: Sports Medicine

## 2023-08-13 DIAGNOSIS — F411 Generalized anxiety disorder: Secondary | ICD-10-CM

## 2023-08-15 DIAGNOSIS — L820 Inflamed seborrheic keratosis: Secondary | ICD-10-CM | POA: Diagnosis not present

## 2023-08-15 DIAGNOSIS — L821 Other seborrheic keratosis: Secondary | ICD-10-CM | POA: Diagnosis not present

## 2023-08-15 DIAGNOSIS — Z85828 Personal history of other malignant neoplasm of skin: Secondary | ICD-10-CM | POA: Diagnosis not present

## 2023-08-15 DIAGNOSIS — L720 Epidermal cyst: Secondary | ICD-10-CM | POA: Diagnosis not present

## 2023-08-15 DIAGNOSIS — D2361 Other benign neoplasm of skin of right upper limb, including shoulder: Secondary | ICD-10-CM | POA: Diagnosis not present

## 2023-08-15 DIAGNOSIS — D1801 Hemangioma of skin and subcutaneous tissue: Secondary | ICD-10-CM | POA: Diagnosis not present

## 2023-08-15 DIAGNOSIS — Z85820 Personal history of malignant melanoma of skin: Secondary | ICD-10-CM | POA: Diagnosis not present

## 2023-08-15 DIAGNOSIS — L565 Disseminated superficial actinic porokeratosis (DSAP): Secondary | ICD-10-CM | POA: Diagnosis not present

## 2023-08-15 DIAGNOSIS — L570 Actinic keratosis: Secondary | ICD-10-CM | POA: Diagnosis not present

## 2023-08-16 ENCOUNTER — Ambulatory Visit: Admitting: Orthopedic Surgery

## 2023-09-01 ENCOUNTER — Other Ambulatory Visit: Payer: Self-pay | Admitting: Sports Medicine

## 2023-09-12 ENCOUNTER — Encounter: Payer: Self-pay | Admitting: Sports Medicine

## 2023-10-10 ENCOUNTER — Ambulatory Visit: Admitting: Gastroenterology

## 2023-10-13 ENCOUNTER — Other Ambulatory Visit: Payer: Self-pay

## 2023-10-13 DIAGNOSIS — I10 Essential (primary) hypertension: Secondary | ICD-10-CM

## 2023-10-13 MED ORDER — IRBESARTAN 300 MG PO TABS: 300.0000 mg | ORAL_TABLET | Freq: Every day | ORAL | 1 refills | Status: AC

## 2023-10-24 ENCOUNTER — Encounter: Payer: Self-pay | Admitting: Family Medicine

## 2023-10-24 ENCOUNTER — Other Ambulatory Visit: Payer: Self-pay

## 2023-10-24 DIAGNOSIS — F320 Major depressive disorder, single episode, mild: Secondary | ICD-10-CM

## 2023-10-24 DIAGNOSIS — E119 Type 2 diabetes mellitus without complications: Secondary | ICD-10-CM

## 2023-10-24 MED ORDER — BUPROPION HCL ER (XL) 300 MG PO TB24: 300.0000 mg | ORAL_TABLET | Freq: Every day | ORAL | 0 refills | Status: DC

## 2023-10-24 MED ORDER — SITAGLIPTIN PHOSPHATE 50 MG PO TABS: 50.0000 mg | ORAL_TABLET | Freq: Every day | ORAL | 0 refills | Status: DC

## 2023-11-01 ENCOUNTER — Ambulatory Visit: Admitting: Family Medicine

## 2023-11-01 ENCOUNTER — Encounter: Payer: Self-pay | Admitting: Family Medicine

## 2023-11-01 ENCOUNTER — Ambulatory Visit: Admitting: Sports Medicine

## 2023-11-01 VITALS — BP 139/78 | HR 66 | Ht 67.0 in | Wt 158.0 lb

## 2023-11-01 DIAGNOSIS — N5239 Other post-surgical erectile dysfunction: Secondary | ICD-10-CM

## 2023-11-01 DIAGNOSIS — I10 Essential (primary) hypertension: Secondary | ICD-10-CM

## 2023-11-01 DIAGNOSIS — E119 Type 2 diabetes mellitus without complications: Secondary | ICD-10-CM

## 2023-11-01 DIAGNOSIS — E785 Hyperlipidemia, unspecified: Secondary | ICD-10-CM

## 2023-11-01 DIAGNOSIS — E1169 Type 2 diabetes mellitus with other specified complication: Secondary | ICD-10-CM | POA: Insufficient documentation

## 2023-11-01 DIAGNOSIS — L719 Rosacea, unspecified: Secondary | ICD-10-CM

## 2023-11-01 DIAGNOSIS — F320 Major depressive disorder, single episode, mild: Secondary | ICD-10-CM

## 2023-11-01 LAB — POCT GLYCOSYLATED HEMOGLOBIN (HGB A1C): HbA1c, POC (controlled diabetic range): 6 % (ref 0.0–7.0)

## 2023-11-01 NOTE — Assessment & Plan Note (Signed)
 Continue tadalafil  at current strength.

## 2023-11-01 NOTE — Assessment & Plan Note (Signed)
 Blood pressure is fairly well-controlled at this time.  He will continue irbesartan  at current strength.

## 2023-11-01 NOTE — Assessment & Plan Note (Signed)
 Continue topical metronidazole  as this continues to work well for him.

## 2023-11-01 NOTE — Assessment & Plan Note (Signed)
 Diabetes is well-controlled with current strength of Januvia .  Recommend continuation.

## 2023-11-01 NOTE — Assessment & Plan Note (Signed)
 Not currently on a statin.  Will plan to check lipid panel at next visit.  Continue fenofibrate  for elevated triglycerides.

## 2023-11-01 NOTE — Progress Notes (Signed)
 [name] - 73 y.o. male MRN [id number]  Date of birth: [birth year]  Subjective Chief Complaint  Patient presents with   Diabetes    HPI [name] is a 73 y.o. male here today for follow up visit.   He is a former patient of Dr. Curtis.  He reports that he is doing pretty well at this time.  Diabetes is managed with januvia .  Doing well with this.  A1c today is 6.0%.  He does stay pretty active and is doing pretty well with diet.  Remains on fenofibrate  for management of associated hypertriglyceridemia.  He is not currently on a statin.  Blood pressure remains well-controlled with the irbesartan  at current strength.  He denies side effects at this time.  He has not had chest pain, shortness of breath, palpitations, headaches or vision changes.  Mood is stable with bupropion .  Anxiety is fairly well-controlled.  He does use alprazolam  one half tab nightly for chronic insomnia.  More conservative measures such as sleep hygiene changes and trazodone  were not effective.  ROS:  A comprehensive ROS was completed and negative except as noted per HPI  No Known Allergies  Past Medical History:  Diagnosis Date   ADD (attention deficit disorder)    Anxiety    Arthritis 2016   bilateral hands   Cancer (HCC)    PROSTATE   Diabetes mellitus without complication (HCC)    Diverticulitis    Hyperlipidemia    Hypertension    Pain    HX OF 3 BACK SURGERIES - STILL HAS SEVERE BACK PAIN WITH PROLONGED SITTING OR STANDING   PONV (postoperative nausea and vomiting)    NAUSEA AFTER SHOULDER SURGERY   Reflux    MILD - NO DAILY MEDS - ANTIACID IF NEEDED    Past Surgical History:  Procedure Laterality Date   ALLOGRAFT APPLICATION Right 12/06/2022   Procedure: POSSIBLE ALLOGRAFT SPACER;  Surgeon: Arlinda Buster, MD;  Location: [facility];  Service: Orthopedics;  Laterality: Right;   BACK SURGERY     X3  1993, 1998, 2007   CARPECTOMY Right 12/06/2022    Procedure: RIGHT WRIST PROXIMAL ROW CARPECTOMY / THUMB CMC DENERVATION;  Surgeon: Arlinda Buster, MD;  Location: [facility];  Service: Orthopedics;  Laterality: Right;   COLON SURGERY  2008   COLON RESECTION FOR DIVERTICULITIS   HERNIA REPAIR  1997 OR 1998   ING HERNIA REPAIR   RIGHT SHOULDER 2013     ROBOT ASSISTED LAPAROSCOPIC RADICAL PROSTATECTOMY N/A 02/13/2013   Procedure: ROBOTIC ASSISTED LAPAROSCOPIC RADICAL PROSTATECTOMY,, LYSIS OF PELVIC / INTRA-ABDOMINAL ADHESIONS;  Surgeon: Alm GORMAN Fragmin, MD;  Location: [facility];  Service: Urology;  Laterality: N/A;    Social History   Socioeconomic History   Marital status: Married    Spouse name: Barnie   Number of children: 2   Years of education: Not on file   Highest education level: 12th grade  Occupational History   Occupation: Retired  Tobacco Use   Smoking status: Never   Smokeless tobacco: Never  Vaping Use   Vaping status: Never Used  Substance and Sexual Activity   Alcohol use: Yes    Comment: SOCIAL   Drug use: No   Sexual activity: Yes  Other Topics Concern   Not on file  Social History Narrative   Not on file   Social Drivers of Health   Financial Resource Strain: Low Risk  (10/28/2023)   Overall Financial Resource Strain (CARDIA)  Difficulty of Paying Living Expenses: Not hard at all  Food Insecurity: No Food Insecurity (10/28/2023)   Hunger Vital Sign    Worried About Running Out of Food in the Last Year: Never true    Ran Out of Food in the Last Year: Never true  Transportation Needs: No Transportation Needs (10/28/2023)   PRAPARE - Administrator, Civil Service (Medical): No    Lack of Transportation (Non-Medical): No  Physical Activity: Sufficiently Active (10/28/2023)   Exercise Vital Sign    Days of Exercise per Week: 4 days    Minutes of Exercise per Session: 50 min  Stress: No Stress Concern Present (10/28/2023)   [employer] of Occupational Health - Occupational  Stress Questionnaire    Feeling of Stress: Only a little  Social Connections: Socially Integrated (10/28/2023)   Social Connection and Isolation Panel    Frequency of Communication with Friends and Family: More than three times a week    Frequency of Social Gatherings with Friends and Family: More than three times a week    Attends Religious Services: More than 4 times per year    Active Member of Clubs or Organizations: Yes    Attends Engineer, structural: More than 4 times per year    Marital Status: Married    Family History  Problem Relation Age of Onset   Heart attack Mother    Hyperlipidemia Mother    Hypertension Mother    Stroke Mother    Heart attack Father    Hypertension Father    Hyperlipidemia Father    Diabetes Father    Colon cancer Neg Hx    Esophageal cancer Neg Hx    Rectal cancer Neg Hx    Stomach cancer Neg Hx     Health Maintenance  Topic Date Due   Medicare Annual Wellness (AWV)  03/23/2022   COVID-19 Vaccine (7 - 2025-26 season) 09/11/2023   HEMOGLOBIN A1C  05/01/2024   Diabetic kidney evaluation - eGFR measurement  05/02/2024   Diabetic kidney evaluation - Urine ACR  05/02/2024   OPHTHALMOLOGY EXAM  08/06/2024   FOOT EXAM  10/31/2024   Colonoscopy  06/10/2026   DTaP/Tdap/Td (3 - Td or Tdap) 03/08/2027   Pneumococcal Vaccine: 50+ Years  Completed   Influenza Vaccine  Completed   Hepatitis C Screening  Completed   Zoster Vaccines- Shingrix   Completed   Meningococcal B Vaccine  Aged Out     ----------------------------------------------------------------------------------------------------------------------------------------------------------------------------------------------------------------- Physical Exam BP 139/78 (BP Location: Left Arm, Patient Position: Sitting, Cuff Size: Normal)   Pulse 66   Ht 5' 7 (1.702 m)   Wt 158 lb (71.7 kg)   SpO2 98%   BMI 24.75 kg/m   Physical Exam Constitutional:      Appearance: Normal  appearance.  Eyes:     General: No scleral icterus. Cardiovascular:     Rate and Rhythm: Normal rate and regular rhythm.  Pulmonary:     Effort: Pulmonary effort is normal.     Breath sounds: Normal breath sounds.  Musculoskeletal:     Cervical back: Neck supple.  Neurological:     Mental Status: He is alert.  Psychiatric:        Mood and Affect: Mood normal.        Behavior: Behavior normal.     ------------------------------------------------------------------------------------------------------------------------------------------------------------------------------------------------------------------- Assessment and Plan  Essential hypertension, benign Blood pressure is fairly well-controlled at this time.  He will continue irbesartan  at current strength.  Post-surgical erectile dysfunction Continue  tadalafil  at current strength.  Generalized anxiety with depression Continue bupropion  at current strength.  Using alprazolam  nightly as needed for insomnia.    Controlled type 2 diabetes mellitus without complication, without long-term current use of insulin (HCC) Diabetes is well-controlled with current strength of Januvia .  Recommend continuation.  Erythro-telangiectatic rosacea Continue topical metronidazole  as this continues to work well for him.  Hyperlipidemia associated with type 2 diabetes mellitus (HCC) Not currently on a statin.  Will plan to check lipid panel at next visit.  Continue fenofibrate  for elevated triglycerides.   No orders of the defined types were placed in this encounter.   Return in about 6 months (around 05/01/2024) for Type 2 Diabetes.

## 2023-11-01 NOTE — Assessment & Plan Note (Signed)
 Continue bupropion  at current strength.  Using alprazolam  nightly as needed for insomnia.

## 2023-11-13 ENCOUNTER — Encounter: Payer: Self-pay | Admitting: Radiology

## 2023-11-16 DIAGNOSIS — L570 Actinic keratosis: Secondary | ICD-10-CM | POA: Diagnosis not present

## 2023-11-16 DIAGNOSIS — L821 Other seborrheic keratosis: Secondary | ICD-10-CM | POA: Diagnosis not present

## 2023-11-16 DIAGNOSIS — Z85828 Personal history of other malignant neoplasm of skin: Secondary | ICD-10-CM | POA: Diagnosis not present

## 2023-12-15 ENCOUNTER — Other Ambulatory Visit: Payer: Self-pay

## 2023-12-15 DIAGNOSIS — F320 Major depressive disorder, single episode, mild: Secondary | ICD-10-CM

## 2023-12-15 DIAGNOSIS — F5101 Primary insomnia: Secondary | ICD-10-CM

## 2023-12-15 MED ORDER — AMITRIPTYLINE HCL 25 MG PO TABS: 25.0000 mg | ORAL_TABLET | Freq: Every day | ORAL | 0 refills | Status: AC

## 2023-12-15 NOTE — Progress Notes (Signed)
 Pls contact pt to establish a PCP. No additional med refills.

## 2023-12-21 ENCOUNTER — Other Ambulatory Visit: Payer: Self-pay

## 2023-12-21 ENCOUNTER — Encounter: Payer: Self-pay | Admitting: Family Medicine

## 2023-12-21 DIAGNOSIS — E119 Type 2 diabetes mellitus without complications: Secondary | ICD-10-CM

## 2023-12-21 MED ORDER — SITAGLIPTIN PHOSPHATE 50 MG PO TABS: 50.0000 mg | ORAL_TABLET | Freq: Every day | ORAL | 1 refills | Status: AC

## 2023-12-28 ENCOUNTER — Encounter: Payer: Self-pay | Admitting: Family Medicine

## 2023-12-28 ENCOUNTER — Ambulatory Visit: Admitting: Family Medicine

## 2023-12-28 VITALS — BP 165/76 | HR 73 | Ht 67.0 in | Wt 160.0 lb

## 2023-12-28 DIAGNOSIS — K1120 Sialoadenitis, unspecified: Secondary | ICD-10-CM | POA: Insufficient documentation

## 2023-12-28 MED ORDER — AMOXICILLIN-POT CLAVULANATE 875-125 MG PO TABS: 1.0000 | ORAL_TABLET | Freq: Two times a day (BID) | ORAL | 0 refills | Status: AC

## 2023-12-28 NOTE — Progress Notes (Addendum)
 [name] - 73 y.o. male MRN [id number]  Date of birth: Dec 08, 1950  Subjective Chief Complaint  Patient presents with   Jaw Pain    HPI [name] is a 73 y.o. male here today with complaint of L sided jaw pain.  Symptoms started about 7 days ago.SABRA  He does have some swelling over this area as well.  Denies tooth pain, ear pain, no pain with chewing or eating.  No recent immunizations.    ROS:  A comprehensive ROS was completed and negative except as noted per HPI  Allergies[1]  Past Medical History:  Diagnosis Date   ADD (attention deficit disorder)    Anxiety    Arthritis 2016   bilateral hands   Cancer (HCC)    PROSTATE   Diabetes mellitus without complication (HCC)    Diverticulitis    Hyperlipidemia    Hypertension    Pain    HX OF 3 BACK SURGERIES - STILL HAS SEVERE BACK PAIN WITH PROLONGED SITTING OR STANDING   PONV (postoperative nausea and vomiting)    NAUSEA AFTER SHOULDER SURGERY   Reflux    MILD - NO DAILY MEDS - ANTIACID IF NEEDED    Past Surgical History:  Procedure Laterality Date   ALLOGRAFT APPLICATION Right 12/06/2022   Procedure: POSSIBLE ALLOGRAFT SPACER;  Surgeon: Arlinda Buster, MD;  Location: [facility];  Service: Orthopedics;  Laterality: Right;   BACK SURGERY     X3  1993, 1998, 2007   CARPECTOMY Right 12/06/2022   Procedure: RIGHT WRIST PROXIMAL ROW CARPECTOMY / THUMB CMC DENERVATION;  Surgeon: Arlinda Buster, MD;  Location:  SUR[facility]ERY [facility];  Service: Orthopedics;  Laterality: Right;   COLON SURGERY  2008   COLON RESECTION FOR DIVERTICULITIS   HERNIA REPAIR  1997 OR 1998   ING HERNIA REPAIR   RIGHT SHOULDER 2013     ROBOT ASSISTED LAPAROSCOPIC RADICAL PROSTATECTOMY N/A 02/13/2013   Procedure: ROBOTIC ASSISTED LAPAROSCOPIC RADICAL PROSTATECTOMY,, LYSIS OF PELVIC / INTRA-ABDOMINAL ADHESIONS;  Surgeon: Alm GORMAN Fragmin, MD;  Location: [facility];  Service: Urology;  Laterality: N/A;    Social History    Socioeconomic History   Marital status: Married    Spouse name: Barnie   Number of children: 2   Years of education: Not on file   Highest education level: 12th grade  Occupational History   Occupation: Retired  Tobacco Use   Smoking status: Never   Smokeless tobacco: Never  Vaping Use   Vaping status: Never Used  Substance and Sexual Activity   Alcohol use: Yes    Comment: SOCIAL   Drug use: No   Sexual activity: Yes  Other Topics Concern   Not on file  Social History Narrative   Not on file   Social Drivers of Health   Tobacco Use: Low Risk (12/28/2023)   Patient History    Smoking Tobacco Use: Never    Smokeless Tobacco Use: Never    Passive Exposure: Not on file  Financial Resource Strain: Low Risk (10/28/2023)   Overall Financial Resource Strain (CARDIA)    Difficulty of Paying Living Expenses: Not hard at all  Food Insecurity: No Food Insecurity (10/28/2023)   Epic    Worried About Radiation Protection Practitioner of Food in the Last Year: Never true    Ran Out of Food in the Last Year: Never true  Transportation Needs: No Transportation Needs (10/28/2023)   Epic    Lack of Transportation (Medical): No  Lack of Transportation (Non-Medical): No  Physical Activity: Sufficiently Active (10/28/2023)   Exercise Vital Sign    Days of Exercise per Week: 4 days    Minutes of Exercise per Session: 50 min  Stress: No Stress Concern Present (10/28/2023)   [employer] of Occupational Health - Occupational Stress Questionnaire    Feeling of Stress: Only a little  Social Connections: Socially Integrated (10/28/2023)   Social Connection and Isolation Panel    Frequency of Communication with Friends and Family: More than three times a week    Frequency of Social Gatherings with Friends and Family: More than three times a week    Attends Religious Services: More than 4 times per year    Active Member of Clubs or Organizations: Yes    Attends Banker Meetings: More than  4 times per year    Marital Status: Married  Depression (PHQ2-9): Low Risk (11/01/2023)   Depression (PHQ2-9)    PHQ-2 Score: 1  Alcohol Screen: Low Risk (10/28/2023)   Alcohol Screen    Last Alcohol Screening Score (AUDIT): 3  Housing: Low Risk (10/28/2023)   Epic    Unable to Pay for Housing in the Last Year: No    Number of Times Moved in the Last Year: 0    Homeless in the Last Year: No  Utilities: Not on file  Health Literacy: Not on file    Family History  Problem Relation Age of Onset   Heart attack Mother    Hyperlipidemia Mother    Hypertension Mother    Stroke Mother    Heart attack Father    Hypertension Father    Hyperlipidemia Father    Diabetes Father    Colon cancer Neg Hx    Esophageal cancer Neg Hx    Rectal cancer Neg Hx    Stomach cancer Neg Hx     Health Maintenance  Topic Date Due   Medicare Annual Wellness (AWV)  03/23/2022   COVID-19 Vaccine (7 - 2025-26 season) 09/09/2024 (Originally 09/11/2023)   HEMOGLOBIN A1C  05/01/2024   Diabetic kidney evaluation - eGFR measurement  05/02/2024   Diabetic kidney evaluation - Urine ACR  05/02/2024   OPHTHALMOLOGY EXAM  08/06/2024   FOOT EXAM  10/31/2024   Colonoscopy  06/10/2026   DTaP/Tdap/Td (3 - Td or Tdap) 03/08/2027   Pneumococcal Vaccine: 50+ Years  Completed   Influenza Vaccine  Completed   Hepatitis C Screening  Completed   Zoster Vaccines- Shingrix   Completed   Meningococcal B Vaccine  Aged Out     ----------------------------------------------------------------------------------------------------------------------------------------------------------------------------------------------------------------- Physical Exam BP (!) 165/76 (BP Location: Left Arm, Patient Position: Sitting, Cuff Size: Normal)   Pulse 73   Ht 5' 7 (1.702 m)   Wt 160 lb (72.6 kg)   SpO2 100%   BMI 25.06 kg/m   Physical Exam Constitutional:      Appearance: Normal appearance.  HENT:     Right Ear: Tympanic  membrane normal.     Left Ear: Tympanic membrane normal.     Mouth/Throat:     Mouth: Mucous membranes are moist.     Comments: TTP over L parotid gland.  Eyes:     General: No scleral icterus. Cardiovascular:     Rate and Rhythm: Normal rate and regular rhythm.  Pulmonary:     Effort: Pulmonary effort is normal.     Breath sounds: Normal breath sounds.  Lymphadenopathy:     Cervical: No cervical adenopathy.  Neurological:  Mental Status: He is alert.  Psychiatric:        Mood and Affect: Mood normal.        Behavior: Behavior normal.     ------------------------------------------------------------------------------------------------------------------------------------------------------------------------------------------------------------------- Assessment and Plan  Parotitis Adding augmentin .  Stat US  of parotid gland ordered.  Red flags reviewed.     Meds ordered this encounter  Medications   amoxicillin -clavulanate (AUGMENTIN ) 875-125 MG tablet    Sig: Take 1 tablet by mouth 2 (two) times daily.    Dispense:  20 tablet    Refill:  0    No follow-ups on file.        [1] No Known Allergies

## 2023-12-28 NOTE — Assessment & Plan Note (Signed)
 Adding augmentin .  Stat US  of parotid gland ordered.  Red flags reviewed.

## 2023-12-28 NOTE — Patient Instructions (Signed)
 Parotitis  Parotitis means that you have irritation and swelling (inflammation) in one or both of your parotid glands. These glands make saliva. They are found on each side of your face, below and in front of your earlobes. You may or may not have pain with this condition. What are the causes? This condition may be caused by: Infections from germs (bacteria or viruses). Something blocking the flow of saliva through the parotid glands. This can be a stone, scar tissue, or a tumor. Diseases that cause your body's defense system (immune system) to attack healthy cells in your salivary glands. These are called autoimmune diseases. What increases the risk? Being 75 years old or older. Not drinking enough fluids (being dehydrated). Drinking too much alcohol. Having: A dry mouth. Diabetes. Gout. A long-term illness. Not taking good care of your mouth and teeth (poor oral hygiene). Having had radiation treatments to the head and neck. Taking certain medicines. What are the signs or symptoms? Swelling under and in front of the ear. This may get worse after you eat. Pain and tenderness over the parotid gland. This may get worse after you eat. Redness and warmth of the skin over the parotid gland. Fever or chills. Pus coming from the ducts inside the mouth. Dry mouth. A bad taste in the mouth. How is this treated? Treatment depends on the cause. It may include: Antibiotic medicine for an infection from bacteria. NSAIDs, such as ibuprofen, to treat pain and swelling. Drinking more fluids. Removing a stone or obstruction. Treating a disease that is causing parotitis. Surgery to drain an infection, remove a growth, or remove the whole gland. Treatment may not be needed if the swelling goes away with home care. Follow these instructions at home: Medicines  Take over-the-counter and prescription medicines only as told by your doctor. If you were prescribed an antibiotic medicine, take it as  told by your doctor. Do not stop taking it even if you start to feel better. Managing pain and swelling If told, put heat on the affected area. Do this as often as told by your doctor. Use the heat source that your doctor recommends, such as a moist heat pack or a heating pad. Place a towel between your skin and the heat source. Leave the heat on for 20-30 minutes. Take off the heat if your skin turns bright red. This is very important. If you cannot feel pain, heat, or cold, you have a greater risk of getting burned. Gargle with salt water 3-4 times a day or as needed. To make salt water, dissolve -1 tsp (3-6 g) of salt in 1 cup (237 mL) of warm water. Gently rub your parotid glands as told by your doctor. General instructions  Drink enough fluid to keep your pee (urine) pale yellow. Keep your mouth clean and moist. Suck on sour candy. This may help to: Make your mouth less dry. Make more saliva. Take good care of your mouth: Brush your teeth at least two times a day. Floss your teeth every day. See your dentist regularly. Do not smoke or use any products that contain nicotine or tobacco. If you need help quitting, ask your doctor. Do not drink alcohol. Keep all follow-up visits. Contact a doctor if: You have a fever or chills. You have new symptoms. Your symptoms get worse. Your symptoms do not get better with treatment. Get help right away if: You have trouble breathing or swallowing. These symptoms may be an emergency. Get help right away. Call your  local emergency services (911 in the [place] Do not wait to see if the symptoms will go away. Do not drive yourself to the hospital. Summary Parotitis means that you have irritation and swelling (inflammation) in one or both of your parotid glands. Symptoms include pain and swelling under and in front of the ear. Treatment for parotitis depends on the cause. In some cases, the condition may go away on its own with home care. You  should drink plenty of fluids, take good care of your mouth, and do not use products that contain nicotine or tobacco. This information is not intended to replace advice given to you by your health care provider. Make sure you discuss any questions you have with your health care provider. Document Revised: 05/08/2020 Document Reviewed: 05/08/2020 Elsevier Patient Education  2024 [employer].

## 2023-12-29 ENCOUNTER — Ambulatory Visit (INDEPENDENT_AMBULATORY_CARE_PROVIDER_SITE_OTHER)

## 2023-12-29 DIAGNOSIS — K1120 Sialoadenitis, unspecified: Secondary | ICD-10-CM

## 2024-01-01 ENCOUNTER — Ambulatory Visit: Payer: Self-pay | Admitting: Family Medicine

## 2024-01-09 ENCOUNTER — Encounter: Payer: Self-pay | Admitting: Family Medicine

## 2024-01-12 ENCOUNTER — Encounter: Payer: Self-pay | Admitting: Family Medicine

## 2024-01-12 DIAGNOSIS — E119 Type 2 diabetes mellitus without complications: Secondary | ICD-10-CM

## 2024-01-12 MED ORDER — ONETOUCH ULTRA VI STRP: ORAL_STRIP | 12 refills | Status: DC

## 2024-01-12 MED ORDER — ONETOUCH DELICA PLUS LANCET30G MISC: 99 refills | Status: AC

## 2024-01-14 ENCOUNTER — Encounter: Payer: Self-pay | Admitting: Family Medicine

## 2024-01-15 MED ORDER — ACCU-CHEK GUIDE TEST VI STRP: ORAL_STRIP | 12 refills | Status: AC

## 2024-01-15 MED ORDER — ACCU-CHEK GUIDE W/DEVICE KIT: PACK | 0 refills | Status: AC

## 2024-01-16 ENCOUNTER — Encounter: Payer: Self-pay | Admitting: Family Medicine

## 2024-01-16 DIAGNOSIS — E119 Type 2 diabetes mellitus without complications: Secondary | ICD-10-CM

## 2024-01-16 MED ORDER — ACCU-CHEK SOFTCLIX LANCETS MISC: 12 refills | Status: AC

## 2024-01-18 ENCOUNTER — Encounter: Payer: Self-pay | Admitting: Family Medicine

## 2024-01-18 ENCOUNTER — Other Ambulatory Visit: Payer: Self-pay | Admitting: Medical-Surgical

## 2024-01-18 DIAGNOSIS — F320 Major depressive disorder, single episode, mild: Secondary | ICD-10-CM

## 2024-01-18 MED ORDER — BUPROPION HCL ER (XL) 300 MG PO TB24: 300.0000 mg | ORAL_TABLET | Freq: Every day | ORAL | 0 refills | Status: AC

## 2024-01-23 ENCOUNTER — Encounter: Payer: Self-pay | Admitting: Family Medicine

## 2024-01-24 ENCOUNTER — Ambulatory Visit

## 2024-01-24 VITALS — BP 152/82 | HR 71 | Resp 18 | Ht 67.0 in

## 2024-01-24 DIAGNOSIS — I10 Essential (primary) hypertension: Secondary | ICD-10-CM

## 2024-01-24 NOTE — Progress Notes (Signed)
" ° °  Subjective:    Patient ID: [name], male    DOB: [birth year], 74 y.o.   MRN: [id number]  HPI  Patient is here for a BP check. He currently takes irbesartan  300 mg currently takes his medication at night, so when he is monitoring his readings throughout the day it is without his medication. Dr. Alvia reviewed patients at home readings. Denies CP, SOB, dizziness, or vision changes.  Review of Systems     Objective:   Physical Exam        Assessment & Plan:   Patient first BP reading is 150/84. His second is 152/82. Dr. Alvia recommended patient start taking BP medication in the morning, for a more accurate reading. Cont. To monitor and keep his routine scheduled appt. With him. "

## 2024-01-24 NOTE — Telephone Encounter (Signed)
 Patient scheduled for a nurse visit today 01/24/2024 at 2:30pm

## 2024-01-30 ENCOUNTER — Other Ambulatory Visit: Payer: Self-pay

## 2024-01-30 DIAGNOSIS — E782 Mixed hyperlipidemia: Secondary | ICD-10-CM

## 2024-01-30 MED ORDER — FENOFIBRATE 160 MG PO TABS: 160.0000 mg | ORAL_TABLET | Freq: Every day | ORAL | 0 refills | Status: AC

## 2024-05-01 ENCOUNTER — Ambulatory Visit: Admitting: Family Medicine
